# Patient Record
Sex: Female | Born: 1975 | Race: Black or African American | Hispanic: No | Marital: Married | Smoking: Never smoker
Health system: Southern US, Community
[De-identification: ages and names within clinical notes are randomized; demographics above are authoritative.]

## PROBLEM LIST (undated history)

## (undated) ENCOUNTER — Inpatient Hospital Stay (HOSPITAL_COMMUNITY): Payer: Self-pay

## (undated) DIAGNOSIS — H209 Unspecified iridocyclitis: Secondary | ICD-10-CM

## (undated) DIAGNOSIS — K219 Gastro-esophageal reflux disease without esophagitis: Secondary | ICD-10-CM

## (undated) DIAGNOSIS — F329 Major depressive disorder, single episode, unspecified: Secondary | ICD-10-CM

## (undated) DIAGNOSIS — O24419 Gestational diabetes mellitus in pregnancy, unspecified control: Secondary | ICD-10-CM

## (undated) DIAGNOSIS — E282 Polycystic ovarian syndrome: Secondary | ICD-10-CM

## (undated) DIAGNOSIS — F419 Anxiety disorder, unspecified: Secondary | ICD-10-CM

## (undated) DIAGNOSIS — F32A Depression, unspecified: Secondary | ICD-10-CM

## (undated) DIAGNOSIS — J40 Bronchitis, not specified as acute or chronic: Secondary | ICD-10-CM

## (undated) DIAGNOSIS — M069 Rheumatoid arthritis, unspecified: Secondary | ICD-10-CM

## (undated) DIAGNOSIS — D649 Anemia, unspecified: Secondary | ICD-10-CM

## (undated) HISTORY — DX: Anxiety disorder, unspecified: F41.9

## (undated) HISTORY — DX: Depression, unspecified: F32.A

## (undated) HISTORY — PX: REDUCTION MAMMAPLASTY: SUR839

## (undated) HISTORY — DX: Major depressive disorder, single episode, unspecified: F32.9

## (undated) HISTORY — PX: BREAST REDUCTION SURGERY: SHX8

## (undated) HISTORY — PX: DILATION AND CURETTAGE OF UTERUS: SHX78

## (undated) HISTORY — PX: OTHER SURGICAL HISTORY: SHX169

---

## 2017-03-13 ENCOUNTER — Encounter (HOSPITAL_COMMUNITY): Payer: Self-pay | Admitting: Emergency Medicine

## 2017-03-13 ENCOUNTER — Emergency Department (HOSPITAL_COMMUNITY)
Admission: EM | Admit: 2017-03-13 | Discharge: 2017-03-13 | Disposition: A | Discharge status: Home / Self Care | Payer: Self-pay | Attending: Emergency Medicine | Admitting: Emergency Medicine

## 2017-03-13 DIAGNOSIS — Z5321 Procedure and treatment not carried out due to patient leaving prior to being seen by health care provider: Secondary | ICD-10-CM | POA: Insufficient documentation

## 2017-03-13 DIAGNOSIS — H5712 Ocular pain, left eye: Secondary | ICD-10-CM | POA: Insufficient documentation

## 2017-03-13 HISTORY — DX: Gastro-esophageal reflux disease without esophagitis: K21.9

## 2017-03-13 HISTORY — DX: Rheumatoid arthritis, unspecified: M06.9

## 2017-03-13 HISTORY — DX: Unspecified iridocyclitis: H20.9

## 2017-03-13 NOTE — ED Triage Notes (Signed)
Pt sts left sided eye pain from iritis; pt sts ran out of one of her eye drops; pt is taking pred

## 2017-03-13 NOTE — ED Notes (Signed)
Pt very upset about the wait time. This tech explained we are doing everything possible to get everyone seen in a timely matter. Encouraged Pt to stay.

## 2017-03-14 ENCOUNTER — Encounter (HOSPITAL_COMMUNITY): Payer: Self-pay | Admitting: Emergency Medicine

## 2017-03-14 ENCOUNTER — Emergency Department (HOSPITAL_COMMUNITY)
Admission: EM | Admit: 2017-03-14 | Discharge: 2017-03-14 | Disposition: A | Discharge status: Home / Self Care | Payer: Self-pay | Attending: Emergency Medicine | Admitting: Emergency Medicine

## 2017-03-14 DIAGNOSIS — H5712 Ocular pain, left eye: Secondary | ICD-10-CM | POA: Insufficient documentation

## 2017-03-14 NOTE — ED Notes (Signed)
Bed: WTR5 Expected date:  Expected time:  Means of arrival:  Comments: 

## 2017-03-14 NOTE — Discharge Instructions (Signed)
The eye doctors at [facility] will see you in their office now.

## 2017-03-14 NOTE — ED Notes (Signed)
Bed: WTR6 Expected date:  Expected time:  Means of arrival:  Comments: 

## 2017-03-14 NOTE — ED Triage Notes (Signed)
Pt states that she has been dx with iritis.  Has been bothering her for 3 weeks.  Has been to her opthalmologist who gave her durasol and was seen in ER 3 wks ago and was given prednisone.  Has noticed some improvement with the durasol.  States it's not better.  Hx of rheumatoid arthritis.

## 2017-03-14 NOTE — ED Provider Notes (Signed)
[place] DEPT Provider Note   CSN: [id number] Arrival date & time: 03/14/17  1049   By signing my name below, I, Soijett Blue, attest that this documentation has been prepared under the direction and in the presence of Debroah Baller, NP Electronically Signed: Soijett Blue, ED Scribe. 03/14/17. 11:25 AM.  History   Chief Complaint Chief Complaint  Patient presents with  . Eye Pain    HPI Anna Lucero is a 41 y.o. female with a PMHx of iritis, who presents to the Emergency Department complaining of left eye pain onset 3 weeks ago. Pt reports associated photophobia and blurred vision. Pt has tried Rx durasol and prednisone with mild relief of her symptoms. She notes that her symptoms began with left eye pain and light sensitivity. Pt reports that she was evaluated in the ED in [place] 3 weeks ago and given Rx prednisone. Pt notes that she went to the opthalamologist who prescribed her durasol. Pt states that she was placed on Methapred by her rheumatologist. She notes that she is not returning to [place] and will be staying in [place]. She denies eye discharge, watery eyes, eye redness, fever, chills, and any other symptoms. Denies allergies to any medications.   The history is provided by the patient. No language interpreter was used.  Eye Pain  This is a recurrent problem. The current episode started more than 1 week ago (3 weeks ago). The problem occurs constantly. The problem has not changed since onset.Exacerbated by: lights. Nothing relieves the symptoms. Treatments tried: methapred, prednisone, and durasol. The treatment provided mild relief.    Past Medical History:  Diagnosis Date  . GERD (gastroesophageal reflux disease)   . Iritis   . RA (rheumatoid arthritis) (HCC)     There are no active problems to display for this patient.   No past surgical history on file.  OB History    No data available       Home Medications    Prior to Admission medications   Not on  File    Family History No family history on file.  Social History Social History  Substance Use Topics  . Smoking status: Never Smoker  . Smokeless tobacco: Never Used  . Alcohol use No     Allergies   Celebrex [celecoxib]   Review of Systems Review of Systems  Constitutional: Negative for chills and fever.  HENT: Negative.   Eyes: Positive for photophobia, pain (left) and visual disturbance. Negative for discharge and redness.  Gastrointestinal: Negative for nausea and vomiting.  Musculoskeletal: Positive for arthralgias. Negative for neck stiffness.     Physical Exam Updated Vital Signs BP 121/83 (BP Location: Left Arm)   Pulse 80   Temp 99.1 F (37.3 C) (Oral)   Resp 16   SpO2 100%   Physical Exam  Constitutional: She is oriented to person, place, and time. She appears well-developed and well-nourished. No distress.  HENT:  Head: Normocephalic and atraumatic.  Eyes: EOM are normal.  Left eye with light sensitivity. Difficulty with exam due to patient not being able to open her eye. Conjunctiva is inflamed.   Neck: Neck supple.  Cardiovascular: Normal rate.   Pulmonary/Chest: Effort normal.  Musculoskeletal: Normal range of motion.  Neurological: She is alert and oriented to person, place, and time.  Skin: Skin is warm and dry.  Psychiatric: She has a normal mood and affect. Her behavior is normal.  Nursing note and vitals reviewed.    ED Treatments /  Results  DIAGNOSTIC STUDIES: Oxygen Saturation is 100% on RA, nl by my interpretation.    COORDINATION OF CARE: 11:22 AM Discussed treatment plan with pt at bedside which includes referral to ophthalmologist and pt agreed to plan.  11:26 AM- Call to [facility] who will see the patient in the office now.    Labs (all labs ordered are listed, but only abnormal results are displayed) Labs Reviewed - No data to display  Radiology No results found.  Procedures Procedures (including  critical care time)  Medications Ordered in ED Medications - No data to display   Initial Impression / Assessment and Plan / ED Course  I have reviewed the triage vital signs and the nursing notes.  Pt presents with left eye pain x 3 weeks. Pt has been evaluated with a full visual workup while in [place] in the ED and with her ophthalmologist. She is out of her eye drops and will be living in [place] and needs a referral.  Pt has been informed to follow up with [facility] today for further evaluation. The patient appears reasonably screened and/or stabilized for discharge and I doubt any other emergent medical condition requiring further screening, evaluation, or treatment in the ED prior to discharge.  Final Clinical Impressions(s) / ED Diagnoses   Final diagnoses:  Left eye pain    New Prescriptions There are no discharge medications for this patient.  I personally performed the services described in this documentation, which was scribed in my presence. The recorded information has been reviewed and is accurate.    Debroah Baller [place], [place] 03/14/17 1233    Davonna Belling, MD 03/14/17 [phone]

## 2017-04-02 ENCOUNTER — Emergency Department (HOSPITAL_COMMUNITY)
Admission: EM | Admit: 2017-04-02 | Discharge: 2017-04-02 | Disposition: A | Discharge status: Home / Self Care | Payer: Medicaid Other | Attending: Emergency Medicine | Admitting: Emergency Medicine

## 2017-04-02 ENCOUNTER — Encounter (HOSPITAL_COMMUNITY): Payer: Self-pay | Admitting: *Deleted

## 2017-04-02 DIAGNOSIS — H5711 Ocular pain, right eye: Secondary | ICD-10-CM | POA: Diagnosis not present

## 2017-04-02 MED ORDER — PREDNISONE 20 MG PO TABS
10.0000 mg | ORAL_TABLET | Freq: Once | ORAL | Status: AC
  Administered 2017-04-02: 10 mg via ORAL
  Filled 2017-04-02: qty 1

## 2017-04-02 MED ORDER — PREDNISONE 20 MG PO TABS: 10.0000 mg | ORAL_TABLET | Freq: Every day | ORAL | 0 refills | Status: DC

## 2017-04-02 NOTE — Discharge Instructions (Signed)
Increase Durazol eyedrops to twice daily. Dr Juluis Pitch from [facility] will see you in the office Tuesday, 04/04/2017 at 8 AM. If you need to switch the time, call the office the morning of 04/04/2017 to arrange a different time that day. Call the number on these discharge instructions to get a primary care physician to [place] for rheumatoid arthritis in any other health problems

## 2017-04-02 NOTE — ED Notes (Signed)
Patient is alert and oriented x3.  She was given DC instructions and follow up visit instructions.  Patient gave verbal understanding. She was DC ambulatory under her own power to home.  V/S stable.  He was not showing any signs of distress on DC 

## 2017-04-02 NOTE — ED Triage Notes (Signed)
Patient is alert and oriented x4.  She is being seen for exacerbation of iritis.  Patient has a Hx of RA and was Dx with iritis 2 months ago and states that this has been an ongoing issue.  Patient has been taking steroids and has tapered off of them to have the iritis flare up.  Currently she rates her pain 8 of 10.

## 2017-04-02 NOTE — ED Notes (Signed)
Bed: WTR7 Expected date:  Expected time:  Means of arrival:  Comments: 

## 2017-04-02 NOTE — ED Provider Notes (Signed)
[facility] DEPT Provider Note   CSN: [id number] Arrival date & time: 04/02/17  0602     History   Chief Complaint Chief Complaint  Patient presents with  . Eye Pain    HPI Anna Lucero is a 41 y.o. female.  HPI Complains of leftt eye pain and progressively worsening vision in right eye for the past one month. Patient was diagnosed with iritis in her right eye month ago from a physician in [place]. She was seen here on 03/14/2017 for the same complaint. That same day she saw Dr.Christopher Manuella Ghazi.. She has been on orencia injections as well as Durazol eyedrops and prednisone.Durazol drops were tapered down to once daily over recent days. She ran out of prednisone 2 days ago after being on prednisone for several years.. Nothing makes symptoms better or worse. No other associated no nausea or vomiting Past Medical History:  Diagnosis Date  . GERD (gastroesophageal reflux disease)   . Iritis   . RA (rheumatoid arthritis) (HCC)     There are no active problems to display for this patient.   Past Surgical History:  Procedure Laterality Date  . abdominal plasty    . filopian tube removal      OB History    No data available       Home Medications    Prior to Admission medications   Not on File    Family History History reviewed. No pertinent family history.  Social History Social History  Substance Use Topics  . Smoking status: Never Smoker  . Smokeless tobacco: Never Used  . Alcohol use No     Allergies   Celebrex [celecoxib]   Review of Systems Review of Systems  Constitutional: Negative.   Eyes: Positive for pain and visual disturbance. Negative for redness.     Physical Exam Updated Vital Signs BP 124/86 (BP Location: Left Arm)   Pulse 82   Temp 98.6 F (37 C) (Oral)   Resp 18   Ht 5\' 2"  (1.575 m)   Wt 83 kg (183 lb)   LMP 03/04/2017   SpO2 100%   BMI 33.47 kg/m   Physical Exam  Constitutional: She is oriented to person, place,  and time. She appears well-developed and well-nourished.  HENT:  Head: Normocephalic and atraumatic.  Eyes: Pupils are equal, round, and reactive to light. Conjunctivae are normal. Left eye exhibits no discharge.  Coronaries are normal. Visual acuity right eye 20/25-1 Left eye 20/70. There is no redness  Neck: Neck supple. No tracheal deviation present. No thyromegaly present.  Cardiovascular: Normal rate.   No murmur heard. Pulmonary/Chest: Effort normal.  Abdominal: She exhibits no distension.  Musculoskeletal: Normal range of motion. She exhibits no edema.  Neurological: She is alert and oriented to person, place, and time. Coordination normal.  Gait normal cranial nerves II through XII grossly intact  Skin: Skin is warm and dry. No rash noted.  Psychiatric: She has a normal mood and affect.  Nursing note and vitals reviewed.    ED Treatments / Results  Labs (all labs ordered are listed, but only abnormal results are displayed) Labs Reviewed - No data to display  EKG  EKG Interpretation None       Radiology No results found.  Procedures Procedures (including critical care time)  Medications Ordered in ED Medications - No data to display  I spoke with Dr.Botorrff on-call for [place] at [facility]. Plan increase durazol lobster twice daily. Already prescription for prednisone 10  mg daily. She is referred to primary care. Dr.Botorff will see patient in her office in 2 days Initial Impression / Assessment and Plan / ED Course  I have reviewed the triage vital signs and the nursing notes.  Pertinent labs & imaging results that were available during my care of the patient were reviewed by me and considered in my medical decision making (see chart for details).       Final Clinical Impressions(s) / ED Diagnoses  Dx right eye pain Final diagnoses:  None    New Prescriptions New Prescriptions   No medications on file     Orlie Dakin,  MD 04/02/17 [phone]

## 2017-04-02 NOTE — ED Notes (Signed)
Visual acuity completed by MD.

## 2017-05-23 ENCOUNTER — Encounter: Payer: Self-pay | Admitting: Emergency Medicine

## 2017-05-23 ENCOUNTER — Emergency Department (HOSPITAL_COMMUNITY): Payer: Medicaid Other

## 2017-05-23 ENCOUNTER — Emergency Department (HOSPITAL_COMMUNITY)
Admission: EM | Admit: 2017-05-23 | Discharge: 2017-05-23 | Disposition: A | Discharge status: Home / Self Care | Payer: Medicaid Other | Attending: Emergency Medicine | Admitting: Emergency Medicine

## 2017-05-23 DIAGNOSIS — N83202 Unspecified ovarian cyst, left side: Secondary | ICD-10-CM | POA: Diagnosis not present

## 2017-05-23 DIAGNOSIS — R1030 Lower abdominal pain, unspecified: Secondary | ICD-10-CM | POA: Diagnosis present

## 2017-05-23 DIAGNOSIS — Z79899 Other long term (current) drug therapy: Secondary | ICD-10-CM | POA: Diagnosis not present

## 2017-05-23 HISTORY — DX: Polycystic ovarian syndrome: E28.2

## 2017-05-23 LAB — LIPASE, BLOOD: LIPASE: 37 U/L (ref 11–51)

## 2017-05-23 LAB — CBC
HEMATOCRIT: 34.6 % — AB (ref 36.0–46.0)
HEMOGLOBIN: 10.7 g/dL — AB (ref 12.0–15.0)
MCH: 23.2 pg — ABNORMAL LOW (ref 26.0–34.0)
MCHC: 30.9 g/dL (ref 30.0–36.0)
MCV: 74.9 fL — ABNORMAL LOW (ref 78.0–100.0)
Platelets: 324 10*3/uL (ref 150–400)
RBC: 4.62 MIL/uL (ref 3.87–5.11)
RDW: 17.9 % — ABNORMAL HIGH (ref 11.5–15.5)
WBC: 13 10*3/uL — ABNORMAL HIGH (ref 4.0–10.5)

## 2017-05-23 LAB — URINALYSIS, ROUTINE W REFLEX MICROSCOPIC
BILIRUBIN URINE: NEGATIVE
GLUCOSE, UA: 50 mg/dL — AB
HGB URINE DIPSTICK: NEGATIVE
Ketones, ur: NEGATIVE mg/dL
Leukocytes, UA: NEGATIVE
NITRITE: NEGATIVE
PROTEIN: NEGATIVE mg/dL
Specific Gravity, Urine: 1.032 — ABNORMAL HIGH (ref 1.005–1.030)
pH: 6 (ref 5.0–8.0)

## 2017-05-23 LAB — COMPREHENSIVE METABOLIC PANEL
ALBUMIN: 3.7 g/dL (ref 3.5–5.0)
ALT: 16 U/L (ref 14–54)
ANION GAP: 10 (ref 5–15)
AST: 19 U/L (ref 15–41)
Alkaline Phosphatase: 70 U/L (ref 38–126)
BILIRUBIN TOTAL: 0.2 mg/dL — AB (ref 0.3–1.2)
BUN: 21 mg/dL — ABNORMAL HIGH (ref 6–20)
CO2: 22 mmol/L (ref 22–32)
Calcium: 9 mg/dL (ref 8.9–10.3)
Chloride: 107 mmol/L (ref 101–111)
Creatinine, Ser: 0.8 mg/dL (ref 0.44–1.00)
GFR calc Af Amer: >60 mL/min (ref >60)
GFR calc non Af Amer: >60 mL/min (ref >60)
GLUCOSE: 147 mg/dL — AB (ref 65–99)
POTASSIUM: 3.6 mmol/L (ref 3.5–5.1)
SODIUM: 139 mmol/L (ref 135–145)
TOTAL PROTEIN: 7.5 g/dL (ref 6.5–8.1)

## 2017-05-23 LAB — I-STAT BETA HCG BLOOD, ED (MC, WL, AP ONLY)

## 2017-05-23 LAB — WET PREP, GENITAL
CLUE CELLS WET PREP: NONE SEEN
SPERM: NONE SEEN
Trich, Wet Prep: NONE SEEN
Yeast Wet Prep HPF POC: NONE SEEN

## 2017-05-23 MED ORDER — KETOROLAC TROMETHAMINE 15 MG/ML IJ SOLN
15.0000 mg | Freq: Once | INTRAMUSCULAR | Status: AC
  Administered 2017-05-23: 15 mg via INTRAVENOUS
  Filled 2017-05-23: qty 1

## 2017-05-23 MED ORDER — TRAMADOL HCL 50 MG PO TABS: 50.0000 mg | ORAL_TABLET | Freq: Four times a day (QID) | ORAL | 0 refills | Status: DC | PRN

## 2017-05-23 MED ORDER — NAPROXEN 500 MG PO TABS: 500.0000 mg | ORAL_TABLET | Freq: Two times a day (BID) | ORAL | 0 refills | Status: DC

## 2017-05-23 NOTE — Discharge Instructions (Signed)
Naprosyn for pain and inflammation. Tramadol for severe pain. Follow up with GYN doctor as scheduled. Return if worsening.

## 2017-05-23 NOTE — ED Provider Notes (Signed)
[facility] DEPT Provider Note   CSN: [id number] Arrival date & time: 05/23/17  1641     History   Chief Complaint Chief Complaint  Patient presents with  . Abdominal Pain    HPI Anna Lucero is a 41 y.o. female.  HPI Anna Lucero is a 41 y.o. female presents to ED with complaint of abdominal pain. Pain is sharp, in the lower abdomen. States feels like "air stuck in cervix." also reports similar to prior ovarian cyst. Worse with walking. No vaginal dc or bleeding. No urinary symptoms. No n/v/d. No fever or chills. Pt tried aleve for help with no relief. Denies any other associated symptoms. She does report history of polycystic ovarian syndrome.  Past Medical History:  Diagnosis Date  . GERD (gastroesophageal reflux disease)   . Iritis   . PCOS (polycystic ovarian syndrome)   . RA (rheumatoid arthritis) (HCC)     There are no active problems to display for this patient.   Past Surgical History:  Procedure Laterality Date  . abdominal plasty    . filopian tube removal      OB History    No data available       Home Medications    Prior to Admission medications   Medication Sig Start Date End Date Taking? Authorizing Provider  MAGNESIUM PO Take 1 tablet by mouth daily.   Yes [provider]  NIACIN PO Take 1 tablet by mouth daily.   Yes [provider]  predniSONE (DELTASONE) 20 MG tablet Take 0.5 tablets (10 mg total) by mouth daily with breakfast. Patient taking differently: Take 20 mg by mouth daily with breakfast.  04/02/17  Yes Orlie Dakin, MD    Family History No family history on file.  Social History Social History  Substance Use Topics  . Smoking status: Never Smoker  . Smokeless tobacco: Never Used  . Alcohol use No     Allergies   Celebrex [celecoxib]   Review of Systems Review of Systems  Constitutional: Negative for chills and fever.  Respiratory: Negative for cough, chest  tightness and shortness of breath.   Cardiovascular: Negative for chest pain, palpitations and leg swelling.  Gastrointestinal: Positive for abdominal pain. Negative for anal bleeding, diarrhea, nausea and vomiting.  Genitourinary: Positive for pelvic pain. Negative for dysuria, flank pain, vaginal bleeding, vaginal discharge and vaginal pain.  Musculoskeletal: Negative for arthralgias, myalgias, neck pain and neck stiffness.  Skin: Negative for rash.  Neurological: Negative for dizziness, weakness and headaches.  All other systems reviewed and are negative.    Physical Exam Updated Vital Signs BP (!) 151/96 (BP Location: Left Arm)   Pulse 83   Temp 98.3 F (36.8 C) (Oral)   Resp 14   Ht 5\' 3"  (1.6 m)   Wt 85.3 kg (188 lb)   LMP 04/30/2017   SpO2 100%   BMI 33.30 kg/m   Physical Exam  Constitutional: She appears well-developed and well-nourished. No distress.  HENT:  Head: Normocephalic.  Eyes: Conjunctivae are normal.  Neck: Neck supple.  Cardiovascular: Normal rate, regular rhythm and normal heart sounds.   Pulmonary/Chest: Effort normal and breath sounds normal. No respiratory distress. She has no wheezes. She has no rales.  Abdominal: Soft. Bowel sounds are normal. She exhibits no distension. There is tenderness. There is no rebound.  Suprapubic tenderness  Genitourinary:  Genitourinary Comments: Normal external genitalia. Normal vaginal canal. Small thin white discharge. Cervix is normal, closed. No CMT. Positive  uterine and bilateral adnexal tenderness. No masses palpated.    Musculoskeletal: She exhibits no edema.  Neurological: She is alert.  Skin: Skin is warm and dry.  Psychiatric: She has a normal mood and affect. Her behavior is normal.  Nursing note and vitals reviewed.    ED Treatments / Results  Labs (all labs ordered are listed, but only abnormal results are displayed) Labs Reviewed  COMPREHENSIVE METABOLIC PANEL - Abnormal; Notable for the following:        Result Value   Glucose, Bld 147 (*)    BUN 21 (*)    Total Bilirubin 0.2 (*)    All other components within normal limits  CBC - Abnormal; Notable for the following:    WBC 13.0 (*)    Hemoglobin 10.7 (*)    HCT 34.6 (*)    MCV 74.9 (*)    MCH 23.2 (*)    RDW 17.9 (*)    All other components within normal limits  LIPASE, BLOOD  URINALYSIS, ROUTINE W REFLEX MICROSCOPIC  I-STAT BETA HCG BLOOD, ED ([facility], [facility], [facility] ONLY)    EKG  EKG Interpretation None       Radiology US Transvaginal Non-ob  Result Date: 05/23/2017 CLINICAL DATA:  Initial evaluation for acute lower abdominal pain. History of polycystic ovaries. EXAM: TRANSABDOMINAL AND TRANSVAGINAL ULTRASOUND OF PELVIS DOPPLER ULTRASOUND OF OVARIES TECHNIQUE: Both transabdominal and transvaginal ultrasound examinations of the pelvis were performed. Transabdominal technique was performed for global imaging of the pelvis including uterus, ovaries, adnexal regions, and pelvic cul-de-sac. It was necessary to proceed with endovaginal exam following the transabdominal exam to visualize the uterus and ovaries. Color and duplex Doppler ultrasound was utilized to evaluate blood flow to the ovaries. COMPARISON:  None. FINDINGS: Uterus Measurements: 8.3 x 4.6 x 5.7 cm. No fibroids or other mass visualized. Endometrium Thickness: 9.9 mm.  No focal abnormality visualized. Right ovary Measurements: 4.3 x 2.7 x 3.9 cm. Normal appearance/no adnexal mass. Left ovary Measurements: 5.1 x 4.3 x 5.2 cm. 3.7 x 3.8 x 3.5 cm complex left ovarian cyst demonstrating heterogeneous internal echogenicity. No internal vascularity or other concerning features. Pulsed Doppler evaluation of both ovaries demonstrates normal low-resistance arterial and venous waveforms. Other findings Trace free fluid within the pelvis, likely physiologic. IMPRESSION: 1. 3.7 x 3.8 x 3.5 cm complex left ovarian cyst, suspected to reflect a hemorrhagic cyst or possibly complex physiologic  cyst with internal debris. A short interval follow-up ultrasound in 6-12 weeks to ensure resolution is recommended. 2. Otherwise normal pelvic ultrasound. No evidence for ovarian torsion. Electronically Signed   By: Jeannine Boga M.D.   On: 05/23/2017 21:41   US Pelvis Complete  Result Date: 05/23/2017 CLINICAL DATA:  Initial evaluation for acute lower abdominal pain. History of polycystic ovaries. EXAM: TRANSABDOMINAL AND TRANSVAGINAL ULTRASOUND OF PELVIS DOPPLER ULTRASOUND OF OVARIES TECHNIQUE: Both transabdominal and transvaginal ultrasound examinations of the pelvis were performed. Transabdominal technique was performed for global imaging of the pelvis including uterus, ovaries, adnexal regions, and pelvic cul-de-sac. It was necessary to proceed with endovaginal exam following the transabdominal exam to visualize the uterus and ovaries. Color and duplex Doppler ultrasound was utilized to evaluate blood flow to the ovaries. COMPARISON:  None. FINDINGS: Uterus Measurements: 8.3 x 4.6 x 5.7 cm. No fibroids or other mass visualized. Endometrium Thickness: 9.9 mm.  No focal abnormality visualized. Right ovary Measurements: 4.3 x 2.7 x 3.9 cm. Normal appearance/no adnexal mass. Left ovary Measurements: 5.1 x 4.3 x 5.2 cm. 3.7  x 3.8 x 3.5 cm complex left ovarian cyst demonstrating heterogeneous internal echogenicity. No internal vascularity or other concerning features. Pulsed Doppler evaluation of both ovaries demonstrates normal low-resistance arterial and venous waveforms. Other findings Trace free fluid within the pelvis, likely physiologic. IMPRESSION: 1. 3.7 x 3.8 x 3.5 cm complex left ovarian cyst, suspected to reflect a hemorrhagic cyst or possibly complex physiologic cyst with internal debris. A short interval follow-up ultrasound in 6-12 weeks to ensure resolution is recommended. 2. Otherwise normal pelvic ultrasound. No evidence for ovarian torsion. Electronically Signed   By: Jeannine Boga M.D.   On: 05/23/2017 21:41   [place] Art/ven Flow Abd Pelv Doppler  Result Date: 05/23/2017 CLINICAL DATA:  Initial evaluation for acute lower abdominal pain. History of polycystic ovaries. EXAM: TRANSABDOMINAL AND TRANSVAGINAL ULTRASOUND OF PELVIS DOPPLER ULTRASOUND OF OVARIES TECHNIQUE: Both transabdominal and transvaginal ultrasound examinations of the pelvis were performed. Transabdominal technique was performed for global imaging of the pelvis including uterus, ovaries, adnexal regions, and pelvic cul-de-sac. It was necessary to proceed with endovaginal exam following the transabdominal exam to visualize the uterus and ovaries. Color and duplex Doppler ultrasound was utilized to evaluate blood flow to the ovaries. COMPARISON:  None. FINDINGS: Uterus Measurements: 8.3 x 4.6 x 5.7 cm. No fibroids or other mass visualized. Endometrium Thickness: 9.9 mm.  No focal abnormality visualized. Right ovary Measurements: 4.3 x 2.7 x 3.9 cm. Normal appearance/no adnexal mass. Left ovary Measurements: 5.1 x 4.3 x 5.2 cm. 3.7 x 3.8 x 3.5 cm complex left ovarian cyst demonstrating heterogeneous internal echogenicity. No internal vascularity or other concerning features. Pulsed Doppler evaluation of both ovaries demonstrates normal low-resistance arterial and venous waveforms. Other findings Trace free fluid within the pelvis, likely physiologic. IMPRESSION: 1. 3.7 x 3.8 x 3.5 cm complex left ovarian cyst, suspected to reflect a hemorrhagic cyst or possibly complex physiologic cyst with internal debris. A short interval follow-up ultrasound in 6-12 weeks to ensure resolution is recommended. 2. Otherwise normal pelvic ultrasound. No evidence for ovarian torsion. Electronically Signed   By: Jeannine Boga M.D.   On: 05/23/2017 21:41    Procedures Procedures (including critical care time)  Medications Ordered in ED Medications - No data to display   Initial Impression / Assessment and Plan / ED Course    I have reviewed the triage vital signs and the nursing notes.  Pertinent labs & imaging results that were available during my care of the patient were reviewed by me and considered in my medical decision making (see chart for details).     Patient in emergency department with pelvic pain. She states history of ovarian cysts and pain similar to that. Pain is worsened with movement. Pain is more in the left side. Denies any changes in her bowels. No nausea or vomiting. No anorexia. No GI symptoms, doubt colitis or appendicitis. Will perform pelvic exam, labs pending.   10:33 PM White blood cell count slightly elevated at 13, hemoglobin 10.7, otherwise unremarkable labs. Ultrasound showed complex, 3.7 x 3.8 x 3.5 cm complex cyst on left ovary. No evidence of torsion. No other abnormalities. Patient feels much better after receiving Toradol. Her vital signs are normal. Showed he has an appointment with her GYN in 2 days. Plenty discharge home with NSAIDs and tramadol, follow-up with her doctor as scheduled. Return precautions discussed if worsening symptoms.  Vitals:   05/23/17 1713 05/23/17 2004 05/23/17 2007 05/23/17 2225  BP: 140/90 (!) 151/96  110/76  Pulse: 97 83  74  Resp: 20 14  14   Temp: 98.3 F (36.8 C)     TempSrc: Oral     SpO2: 97% 100% 100% 100%  Weight: 85.3 kg (188 lb)     Height: 5\' 3"  (1.6 m)        Final Clinical Impressions(s) / ED Diagnoses   Final diagnoses:  Cyst of left ovary    New Prescriptions New Prescriptions   NAPROXEN (NAPROSYN) 500 MG TABLET    Take 1 tablet (500 mg total) by mouth 2 (two) times daily.   TRAMADOL (ULTRAM) 50 MG TABLET    Take 1 tablet (50 mg total) by mouth every 6 (six) hours as needed.     Jeannett Senior, PA-C 05/23/17 2238    Gareth Morgan, MD 05/24/17 [phone]

## 2017-05-23 NOTE — ED Triage Notes (Signed)
Patient c/o lower abd pain that has been constant since starting this morning. Patient denies any n/v/d or urinary problems.

## 2017-05-24 LAB — GC/CHLAMYDIA PROBE AMP (~~LOC~~) NOT AT ARMC
CHLAMYDIA, DNA PROBE: NEGATIVE
Neisseria Gonorrhea: NEGATIVE

## 2017-05-25 DIAGNOSIS — D649 Anemia, unspecified: Secondary | ICD-10-CM | POA: Insufficient documentation

## 2017-05-25 DIAGNOSIS — K2100 Gastro-esophageal reflux disease with esophagitis, without bleeding: Secondary | ICD-10-CM | POA: Insufficient documentation

## 2017-05-25 DIAGNOSIS — E282 Polycystic ovarian syndrome: Secondary | ICD-10-CM | POA: Insufficient documentation

## 2017-05-25 DIAGNOSIS — M059 Rheumatoid arthritis with rheumatoid factor, unspecified: Secondary | ICD-10-CM | POA: Insufficient documentation

## 2017-08-01 ENCOUNTER — Emergency Department (HOSPITAL_COMMUNITY): Payer: BLUE CROSS/BLUE SHIELD

## 2017-08-01 ENCOUNTER — Emergency Department (HOSPITAL_COMMUNITY)
Admission: EM | Admit: 2017-08-01 | Discharge: 2017-08-01 | Disposition: A | Discharge status: Home / Self Care | Payer: BLUE CROSS/BLUE SHIELD | Attending: Emergency Medicine | Admitting: Emergency Medicine

## 2017-08-01 ENCOUNTER — Encounter (HOSPITAL_COMMUNITY): Payer: Self-pay | Admitting: Emergency Medicine

## 2017-08-01 DIAGNOSIS — R102 Pelvic and perineal pain: Secondary | ICD-10-CM

## 2017-08-01 DIAGNOSIS — R439 Unspecified disturbances of smell and taste: Secondary | ICD-10-CM | POA: Insufficient documentation

## 2017-08-01 DIAGNOSIS — Z3A01 Less than 8 weeks gestation of pregnancy: Secondary | ICD-10-CM

## 2017-08-01 DIAGNOSIS — O26891 Other specified pregnancy related conditions, first trimester: Secondary | ICD-10-CM | POA: Insufficient documentation

## 2017-08-01 DIAGNOSIS — Z3A00 Weeks of gestation of pregnancy not specified: Secondary | ICD-10-CM | POA: Insufficient documentation

## 2017-08-01 LAB — COMPREHENSIVE METABOLIC PANEL
ALBUMIN: 3.4 g/dL — AB (ref 3.5–5.0)
ALT: 16 U/L (ref 14–54)
ANION GAP: 9 (ref 5–15)
AST: 15 U/L (ref 15–41)
Alkaline Phosphatase: 55 U/L (ref 38–126)
BILIRUBIN TOTAL: 0.3 mg/dL (ref 0.3–1.2)
BUN: 18 mg/dL (ref 6–20)
CHLORIDE: 105 mmol/L (ref 101–111)
CO2: 23 mmol/L (ref 22–32)
Calcium: 9 mg/dL (ref 8.9–10.3)
Creatinine, Ser: 0.54 mg/dL (ref 0.44–1.00)
GFR calc Af Amer: >60 mL/min (ref >60)
GLUCOSE: 117 mg/dL — AB (ref 65–99)
POTASSIUM: 3 mmol/L — AB (ref 3.5–5.1)
Sodium: 137 mmol/L (ref 135–145)
TOTAL PROTEIN: 6.9 g/dL (ref 6.5–8.1)

## 2017-08-01 LAB — URINALYSIS, ROUTINE W REFLEX MICROSCOPIC
BILIRUBIN URINE: NEGATIVE
Glucose, UA: 150 mg/dL — AB
HGB URINE DIPSTICK: NEGATIVE
Ketones, ur: 5 mg/dL — AB
Nitrite: NEGATIVE
PROTEIN: NEGATIVE mg/dL
SPECIFIC GRAVITY, URINE: 1.035 — AB (ref 1.005–1.030)
pH: 5 (ref 5.0–8.0)

## 2017-08-01 LAB — CBC WITH DIFFERENTIAL/PLATELET
BASOS ABS: 0 10*3/uL (ref 0.0–0.1)
Basophils Relative: 0 %
Eosinophils Absolute: 0.1 10*3/uL (ref 0.0–0.7)
Eosinophils Relative: 1 %
HEMATOCRIT: 32.3 % — AB (ref 36.0–46.0)
HEMOGLOBIN: 10.1 g/dL — AB (ref 12.0–15.0)
LYMPHS PCT: 20 %
Lymphs Abs: 4.4 10*3/uL — ABNORMAL HIGH (ref 0.7–4.0)
MCH: 23.2 pg — ABNORMAL LOW (ref 26.0–34.0)
MCHC: 31.3 g/dL (ref 30.0–36.0)
MCV: 74.3 fL — AB (ref 78.0–100.0)
Monocytes Absolute: 1.4 10*3/uL — ABNORMAL HIGH (ref 0.1–1.0)
Monocytes Relative: 6 %
NEUTROS ABS: 16.5 10*3/uL — AB (ref 1.7–7.7)
Neutrophils Relative %: 73 %
Platelets: 310 10*3/uL (ref 150–400)
RBC: 4.35 MIL/uL (ref 3.87–5.11)
RDW: 17.2 % — ABNORMAL HIGH (ref 11.5–15.5)
WBC: 22.5 10*3/uL — AB (ref 4.0–10.5)

## 2017-08-01 LAB — PREGNANCY, URINE: PREG TEST UR: POSITIVE — AB

## 2017-08-01 LAB — POC URINE PREG, ED: PREG TEST UR: POSITIVE — AB

## 2017-08-01 LAB — HCG, QUANTITATIVE, PREGNANCY: HCG, BETA CHAIN, QUANT, S: 81701 m[IU]/mL — AB (ref <5)

## 2017-08-01 NOTE — ED Provider Notes (Signed)
[facility] DEPT Provider Note   CSN: [id number] Arrival date & time: 08/01/17  0816     History   Chief Complaint Chief Complaint  Patient presents with  . Headache  . metalic taste in mouth    HPI Anna HUNEKE is a 42 y.o. female.  She presents for evaluation of "something prolapsing to my vagina."  This occurred 3 days ago, suddenly, with a gush of vaginal bleeding.  She was evaluated in the ED 2 months ago at that time had a complex cyst, was referred to GYN who she saw, he recommended that Mirena contraceptive device, which she declined.  She states she has not had a menstrual cycle in 2 years.  She also reports a "metallic taste in her mouth, present for 1 week associated with nausea, occasional vomiting, and ill feeling.  She denies fever, chills, cough, shortness of breath or chest pain.  There are no other known modifying factors.    HPI  Past Medical History:  Diagnosis Date  . GERD (gastroesophageal reflux disease)   . Iritis   . PCOS (polycystic ovarian syndrome)   . RA (rheumatoid arthritis) (HCC)     There are no active problems to display for this patient.   Past Surgical History:  Procedure Laterality Date  . abdominal plasty    . filopian tube removal      OB History    No data available       Home Medications    Prior to Admission medications   Medication Sig Start Date End Date Taking? Authorizing Provider  EVENING PRIMROSE OIL PO Take 2 capsules by mouth daily.   Yes [provider]  ibuprofen (ADVIL,MOTRIN) 200 MG tablet Take 600 mg by mouth every 6 (six) hours as needed for moderate pain.   Yes [provider]  Menthol, Topical Analgesic, (BIOFREEZE ROLL-ON) 4 % GEL Apply 1 application topically as needed (pain).   Yes [provider]  naproxen (NAPROSYN) 500 MG tablet Take 1 tablet (500 mg total) by mouth 2 (two) times daily. 05/23/17  Yes Kirichenko, Tatyana, PA-C  omeprazole  (PRILOSEC) 20 MG capsule Take 20 mg by mouth 3 (three) times a week.   Yes [provider]  predniSONE (DELTASONE) 20 MG tablet Take 0.5 tablets (10 mg total) by mouth daily with breakfast. Patient taking differently: Take 20 mg by mouth daily with breakfast.  04/02/17  Yes Orlie Dakin, MD  traMADol (ULTRAM) 50 MG tablet Take 1 tablet (50 mg total) by mouth every 6 (six) hours as needed. Patient not taking: Reported on 08/01/2017 05/23/17   Jeannett Senior, PA-C    Family History No family history on file.  Social History Social History   Tobacco Use  . Smoking status: Never Smoker  . Smokeless tobacco: Never Used  Substance Use Topics  . Alcohol use: No  . Drug use: No     Allergies   Celebrex [celecoxib]   Review of Systems Review of Systems  All other systems reviewed and are negative.    Physical Exam Updated Vital Signs BP (!) 136/91   Pulse 87   Temp 98.3 F (36.8 C) (Oral)   Resp (!) 23   SpO2 99%   Physical Exam  Constitutional: She is oriented to person, place, and time. She appears well-developed and well-nourished.  HENT:  Head: Normocephalic and atraumatic.  Eyes: Conjunctivae and EOM are normal. Pupils are equal, round, and reactive to light.  Neck: Normal range  of motion and phonation normal. Neck supple.  Cardiovascular: Normal rate and regular rhythm.  Pulmonary/Chest: Effort normal and breath sounds normal. She exhibits no tenderness.  Abdominal: Soft. She exhibits no distension. There is no tenderness. There is no guarding.  Genitourinary:  Genitourinary Comments: Normal external female genitalia.  No significant vaginal discharge.  Unable to see cervix with speculum exam.  Bimanual examination there is a large mass palpated in the midline to the left pelvic region.  This mass is moderately tender.  It is likely outside of the vagina.  Musculoskeletal: Normal range of motion.  Neurological: She is alert and oriented to person, place,  and time. She exhibits normal muscle tone.  Skin: Skin is warm and dry.  Psychiatric: She has a normal mood and affect. Her behavior is normal. Judgment and thought content normal.  Nursing note and vitals reviewed.    ED Treatments / Results  Labs (all labs ordered are listed, but only abnormal results are displayed) Labs Reviewed  COMPREHENSIVE METABOLIC PANEL - Abnormal; Notable for the following components:      Result Value   Potassium 3.0 (*)    Glucose, Bld 117 (*)    Albumin 3.4 (*)    All other components within normal limits  CBC WITH DIFFERENTIAL/PLATELET - Abnormal; Notable for the following components:   WBC 22.5 (*)    Hemoglobin 10.1 (*)    HCT 32.3 (*)    MCV 74.3 (*)    MCH 23.2 (*)    RDW 17.2 (*)    Neutro Abs 16.5 (*)    Lymphs Abs 4.4 (*)    Monocytes Absolute 1.4 (*)    All other components within normal limits  URINALYSIS, ROUTINE W REFLEX MICROSCOPIC - Abnormal; Notable for the following components:   APPearance CLOUDY (*)    Specific Gravity, Urine 1.035 (*)    Glucose, UA 150 (*)    Ketones, ur 5 (*)    Leukocytes, UA TRACE (*)    Bacteria, UA RARE (*)    Squamous Epithelial / LPF 6-30 (*)    Crystals PRESENT (*)    All other components within normal limits  PREGNANCY, URINE - Abnormal; Notable for the following components:   Preg Test, Ur POSITIVE (*)    All other components within normal limits  POC URINE PREG, ED - Abnormal; Notable for the following components:   Preg Test, Ur POSITIVE (*)    All other components within normal limits  HCG, QUANTITATIVE, PREGNANCY    EKG  EKG Interpretation None       Radiology US Ob Comp Less 14 Wks  Result Date: 08/01/2017 CLINICAL DATA:  Pelvic pain for 10 days. Complex left ovarian cystic lesion on recent ultrasound. Positive pregnancy test. Unknown LMP. EXAM: OBSTETRIC <14 WK [place] AND TRANSVAGINAL OB US DOPPLER ULTRASOUND OF OVARIES TECHNIQUE: Both transabdominal and transvaginal ultrasound  examinations were performed for complete evaluation of the gestation as well as the maternal uterus, adnexal regions, and pelvic cul-de-sac. Transvaginal technique was performed to assess early pregnancy. Color and duplex Doppler ultrasound was utilized to evaluate blood flow to the ovaries. COMPARISON:  None. FINDINGS: Intrauterine gestational sac: Single Yolk sac:  Visualized. Embryo:  Visualized. Cardiac Activity: Not Visualized. CRL:   6  mm   6 w 2 d                  [place] EDC: 03/25/2018 Subchorionic hemorrhage:  None visualized. Maternal uterus/adnexae: Right ovarian corpus luteum cyst noted.  Normal appearance of left ovary. No mass or abnormal free fluid identified. Pulsed Doppler evaluation of both ovaries demonstrates normal appearing low-resistance arterial and venous waveforms. IMPRESSION: Findings are highly suspicious but not yet definitive for failed pregnancy. Recommend follow-up US in 7 days for definitive diagnosis. This recommendation follows SRU consensus guidelines: Diagnostic Criteria for Nonviable Pregnancy Early in the First Trimester. [employer] Med 2013; 893:8101-75. No mass or free fluid identified. No sonographic evidence for ovarian torsion. Electronically Signed   By: Earle Gell M.D.   On: 08/01/2017 16:10   US Ob Transvaginal  Result Date: 08/01/2017 CLINICAL DATA:  Pelvic pain for 10 days. Complex left ovarian cystic lesion on recent ultrasound. Positive pregnancy test. Unknown LMP. EXAM: OBSTETRIC <14 WK [place] AND TRANSVAGINAL OB US DOPPLER ULTRASOUND OF OVARIES TECHNIQUE: Both transabdominal and transvaginal ultrasound examinations were performed for complete evaluation of the gestation as well as the maternal uterus, adnexal regions, and pelvic cul-de-sac. Transvaginal technique was performed to assess early pregnancy. Color and duplex Doppler ultrasound was utilized to evaluate blood flow to the ovaries. COMPARISON:  None. FINDINGS: Intrauterine gestational sac: Single Yolk sac:   Visualized. Embryo:  Visualized. Cardiac Activity: Not Visualized. CRL:   6  mm   6 w 2 d                  [place] EDC: 03/25/2018 Subchorionic hemorrhage:  None visualized. Maternal uterus/adnexae: Right ovarian corpus luteum cyst noted. Normal appearance of left ovary. No mass or abnormal free fluid identified. Pulsed Doppler evaluation of both ovaries demonstrates normal appearing low-resistance arterial and venous waveforms. IMPRESSION: Findings are highly suspicious but not yet definitive for failed pregnancy. Recommend follow-up US in 7 days for definitive diagnosis. This recommendation follows SRU consensus guidelines: Diagnostic Criteria for Nonviable Pregnancy Early in the First Trimester. [employer] Med 2013; 102:5852-77. No mass or free fluid identified. No sonographic evidence for ovarian torsion. Electronically Signed   By: Earle Gell M.D.   On: 08/01/2017 16:10   [place] Art/ven Flow Abd Pelv Doppler  Result Date: 08/01/2017 CLINICAL DATA:  Pelvic pain for 10 days. Complex left ovarian cystic lesion on recent ultrasound. Positive pregnancy test. Unknown LMP. EXAM: OBSTETRIC <14 WK [place] AND TRANSVAGINAL OB US DOPPLER ULTRASOUND OF OVARIES TECHNIQUE: Both transabdominal and transvaginal ultrasound examinations were performed for complete evaluation of the gestation as well as the maternal uterus, adnexal regions, and pelvic cul-de-sac. Transvaginal technique was performed to assess early pregnancy. Color and duplex Doppler ultrasound was utilized to evaluate blood flow to the ovaries. COMPARISON:  None. FINDINGS: Intrauterine gestational sac: Single Yolk sac:  Visualized. Embryo:  Visualized. Cardiac Activity: Not Visualized. CRL:   6  mm   6 w 2 d                  [place] EDC: 03/25/2018 Subchorionic hemorrhage:  None visualized. Maternal uterus/adnexae: Right ovarian corpus luteum cyst noted. Normal appearance of left ovary. No mass or abnormal free fluid identified. Pulsed Doppler evaluation of both ovaries  demonstrates normal appearing low-resistance arterial and venous waveforms. IMPRESSION: Findings are highly suspicious but not yet definitive for failed pregnancy. Recommend follow-up US in 7 days for definitive diagnosis. This recommendation follows SRU consensus guidelines: Diagnostic Criteria for Nonviable Pregnancy Early in the First Trimester. [employer] Med 2013; 824:2353-61. No mass or free fluid identified. No sonographic evidence for ovarian torsion. Electronically Signed   By: Earle Gell M.D.   On: 08/01/2017 16:10  Procedures Procedures (including critical care time)  Medications Ordered in ED Medications - No data to display   Initial Impression / Assessment and Plan / ED Course  I have reviewed the triage vital signs and the nursing notes.  Pertinent labs & imaging results that were available during my care of the patient were reviewed by me and considered in my medical decision making (see chart for details).  Clinical Course as of Aug 01 1716  Tue Aug 01, 2017  1343 Large pelvic mass on examination with pelvic exam, ultrasound ordered to follow-up on prior left adnexal/ovarian mass, on imaging from October 2018.  [EW]  1655 Preg Test, Ur: (!) POSITIVE [EW]    Clinical Course User Index [EW] Daleen Bo, MD     Patient Vitals for the past 24 hrs:  BP Temp Temp src Pulse Resp SpO2  08/01/17 1243 (!) 136/91 98.3 F (36.8 C) Oral 87 (!) 23 99 %  08/01/17 1137 (!) 140/97 - - (!) 103 18 99 %  08/01/17 0827 (!) 142/96 98.1 F (36.7 C) Oral (!) 105 18 97 %    4:54 PM Reevaluation with update and discussion. After initial assessment and treatment, an updated evaluation reveals patient is comfortable has no further complaints.  Findings discussed with the patient and all questions were answered. Daleen Bo      Final Clinical Impressions(s) / ED Diagnoses   Final diagnoses:  Less than [redacted] weeks gestation of pregnancy    Patient with perception of prolapse,  intravaginally, with findings of apparent enlarged and retroflexed uterus, associated with pregnancy, gestational sac present, without viable pregnancy.  Patient does not have vaginal bleeding.  Cyst, left adnexa, diagnosed, 2 months ago on pelvic ultrasound has entirely resolved.  Patient has a gynecologist for follow-up evaluation and treatment.  Nursing Notes Reviewed/ Care Coordinated Applicable Imaging Reviewed Interpretation of Laboratory Data incorporated into ED treatment  The patient appears reasonably screened and/or stabilized for discharge and I doubt any other medical condition or other [facility] requiring further screening, evaluation, or treatment in the ED at this time prior to discharge.  Plan: Home Medications-APAP for pain; Home Treatments-rest, fluids; return here if the recommended treatment, does not improve the symptoms; Recommended follow up-GYN follow-up 2 days for further testing, and repeat beta hCG   ED Discharge Orders    None       Daleen Bo, MD 08/02/17 [phone]

## 2017-08-01 NOTE — ED Notes (Signed)
Pt is extremely upset with the wait. I explained to pt that we are busy and that we are waiting on ED Provider. Pt made statement "All I need and want is prednisone". I informed pt that we, the Provider and RN can not simply just give medication and that RN can not legally due to scope of practice. Pt is still upset. I have informed ED Provider as he has been extremely busy as well.

## 2017-08-01 NOTE — ED Notes (Signed)
ED Provider at bedside. EDP WENTZ AT BEDSIDE PERFORMED PELVIC WITH Anna Lucero H NT

## 2017-08-01 NOTE — Discharge Instructions (Signed)
The ultrasound today indicated some findings for pregnancy, but no visible pregnancy.  It is important to follow-up with your gynecologist in 2 days for repeat evaluation probably to include blood testing and ongoing management.  Get plenty of rest drink a lot of fluids and use Tylenol as needed for pain or fever.  If you develop complications such as bleeding, or increased pain, or go to the [facility] [address] Admissions unit.

## 2017-08-01 NOTE — ED Notes (Signed)
Patient transported to Ultrasound 

## 2017-08-01 NOTE — ED Notes (Signed)
ED Provider at bedside. 

## 2017-08-01 NOTE — ED Notes (Signed)
RETURNED FROM ULTRASOUND

## 2017-08-01 NOTE — ED Triage Notes (Signed)
Patient c/o sinus like headache x 8 days, metallic taste in her mouth for week even after brushing teeth and eating and drinking.  Patient also reports that feels organ has collapsed into her vaginal area and called her doctor but cant been seen until later this week.

## 2017-08-01 NOTE — ED Notes (Signed)
Will obtain vital signs and blood work once patient returns to room.

## 2017-08-09 ENCOUNTER — Encounter (HOSPITAL_COMMUNITY): Payer: Self-pay | Admitting: *Deleted

## 2017-08-09 ENCOUNTER — Encounter (HOSPITAL_COMMUNITY)
Admission: AD | Disposition: A | Discharge status: Home / Self Care | Payer: Self-pay | Source: Ambulatory Visit | Attending: Obstetrics and Gynecology

## 2017-08-09 ENCOUNTER — Ambulatory Visit (HOSPITAL_COMMUNITY)
Admission: AD | Admit: 2017-08-09 | Discharge: 2017-08-09 | Disposition: A | Discharge status: Home / Self Care | Payer: BLUE CROSS/BLUE SHIELD | Source: Ambulatory Visit | Attending: Obstetrics and Gynecology | Admitting: Obstetrics and Gynecology

## 2017-08-09 ENCOUNTER — Other Ambulatory Visit: Payer: Self-pay

## 2017-08-09 ENCOUNTER — Inpatient Hospital Stay (HOSPITAL_COMMUNITY): Payer: BLUE CROSS/BLUE SHIELD | Admitting: Anesthesiology

## 2017-08-09 DIAGNOSIS — K219 Gastro-esophageal reflux disease without esophagitis: Secondary | ICD-10-CM | POA: Diagnosis not present

## 2017-08-09 DIAGNOSIS — M069 Rheumatoid arthritis, unspecified: Secondary | ICD-10-CM | POA: Diagnosis not present

## 2017-08-09 DIAGNOSIS — H209 Unspecified iridocyclitis: Secondary | ICD-10-CM | POA: Insufficient documentation

## 2017-08-09 DIAGNOSIS — O021 Missed abortion: Secondary | ICD-10-CM | POA: Insufficient documentation

## 2017-08-09 DIAGNOSIS — E282 Polycystic ovarian syndrome: Secondary | ICD-10-CM | POA: Diagnosis not present

## 2017-08-09 DIAGNOSIS — Z79899 Other long term (current) drug therapy: Secondary | ICD-10-CM | POA: Insufficient documentation

## 2017-08-09 HISTORY — PX: DILATION AND EVACUATION: SHX1459

## 2017-08-09 LAB — CBC
HCT: 35.5 % — ABNORMAL LOW (ref 36.0–46.0)
Hemoglobin: 10.9 g/dL — ABNORMAL LOW (ref 12.0–15.0)
MCH: 22.8 pg — AB (ref 26.0–34.0)
MCHC: 30.7 g/dL (ref 30.0–36.0)
MCV: 74.3 fL — ABNORMAL LOW (ref 78.0–100.0)
PLATELETS: 342 10*3/uL (ref 150–400)
RBC: 4.78 MIL/uL (ref 3.87–5.11)
RDW: 17.2 % — ABNORMAL HIGH (ref 11.5–15.5)
WBC: 18.6 10*3/uL — ABNORMAL HIGH (ref 4.0–10.5)

## 2017-08-09 LAB — URINALYSIS, ROUTINE W REFLEX MICROSCOPIC
BILIRUBIN URINE: NEGATIVE
Bacteria, UA: NONE SEEN
Glucose, UA: NEGATIVE mg/dL
Hgb urine dipstick: NEGATIVE
Ketones, ur: NEGATIVE mg/dL
Nitrite: NEGATIVE
PH: 5 (ref 5.0–8.0)
Protein, ur: 30 mg/dL — AB
SPECIFIC GRAVITY, URINE: 1.034 — AB (ref 1.005–1.030)

## 2017-08-09 LAB — TYPE AND SCREEN
ABO/RH(D): B POS
ANTIBODY SCREEN: NEGATIVE

## 2017-08-09 LAB — ABO/RH: ABO/RH(D): B POS

## 2017-08-09 SURGERY — DILATION AND EVACUATION, UTERUS
Anesthesia: Monitor Anesthesia Care

## 2017-08-09 MED ORDER — PROPOFOL 500 MG/50ML IV EMUL
INTRAVENOUS | Status: DC | PRN
  Administered 2017-08-09 (×2): 50 mg via INTRAVENOUS

## 2017-08-09 MED ORDER — FENTANYL CITRATE (PF) 100 MCG/2ML IJ SOLN
INTRAMUSCULAR | Status: DC | PRN
  Administered 2017-08-09 (×2): 50 ug via INTRAVENOUS

## 2017-08-09 MED ORDER — LIDOCAINE HCL (CARDIAC) 20 MG/ML IV SOLN
INTRAVENOUS | Status: AC
  Filled 2017-08-09: qty 5

## 2017-08-09 MED ORDER — ONDANSETRON HCL 4 MG/2ML IJ SOLN
INTRAMUSCULAR | Status: AC
  Filled 2017-08-09: qty 2

## 2017-08-09 MED ORDER — MIDAZOLAM HCL 2 MG/2ML IJ SOLN
INTRAMUSCULAR | Status: AC
  Filled 2017-08-09: qty 2

## 2017-08-09 MED ORDER — LIDOCAINE HCL 1 % IJ SOLN
INTRAMUSCULAR | Status: AC
  Filled 2017-08-09: qty 20

## 2017-08-09 MED ORDER — METHYLPREDNISOLONE SODIUM SUCC 125 MG IJ SOLR
INTRAMUSCULAR | Status: AC
  Filled 2017-08-09: qty 2

## 2017-08-09 MED ORDER — DOXYCYCLINE HYCLATE 100 MG PO TABS: 100.0000 mg | ORAL_TABLET | Freq: Once | ORAL | 0 refills | Status: AC

## 2017-08-09 MED ORDER — DOXYCYCLINE HYCLATE 100 MG PO TABS: 100.0000 mg | ORAL_TABLET | Freq: Once | ORAL | Status: DC

## 2017-08-09 MED ORDER — SOD CITRATE-CITRIC ACID 500-334 MG/5ML PO SOLN
30.0000 mL | Freq: Once | ORAL | Status: AC
  Administered 2017-08-09: 30 mL via ORAL
  Filled 2017-08-09: qty 15

## 2017-08-09 MED ORDER — DOXYCYCLINE HYCLATE 100 MG IV SOLR
200.0000 mg | Freq: Once | INTRAVENOUS | Status: DC
  Filled 2017-08-09: qty 200

## 2017-08-09 MED ORDER — HYDROCORTISONE NA SUCCINATE PF 100 MG IJ SOLR
100.0000 mg | Freq: Once | INTRAMUSCULAR | Status: AC
  Administered 2017-08-09: 100 mg via INTRAVENOUS
  Filled 2017-08-09: qty 2

## 2017-08-09 MED ORDER — LACTATED RINGERS IV SOLN
INTRAVENOUS | Status: DC
  Administered 2017-08-09: 11:00:00 via INTRAVENOUS

## 2017-08-09 MED ORDER — MIDAZOLAM HCL 2 MG/2ML IJ SOLN
INTRAMUSCULAR | Status: DC | PRN
  Administered 2017-08-09: 2 mg via INTRAVENOUS

## 2017-08-09 MED ORDER — OXYCODONE-ACETAMINOPHEN 5-325 MG PO TABS: 1.0000 | ORAL_TABLET | Freq: Four times a day (QID) | ORAL | 0 refills | Status: DC | PRN

## 2017-08-09 MED ORDER — DEXAMETHASONE SODIUM PHOSPHATE 4 MG/ML IJ SOLN
INTRAMUSCULAR | Status: AC
  Filled 2017-08-09: qty 1

## 2017-08-09 MED ORDER — PROPOFOL 500 MG/50ML IV EMUL
INTRAVENOUS | Status: DC | PRN
  Administered 2017-08-09: 150 ug/kg/min via INTRAVENOUS

## 2017-08-09 MED ORDER — OXYCODONE-ACETAMINOPHEN 5-325 MG PO TABS: 1.0000 | ORAL_TABLET | Freq: Four times a day (QID) | ORAL | Status: DC | PRN

## 2017-08-09 MED ORDER — PROPOFOL 10 MG/ML IV BOLUS
INTRAVENOUS | Status: AC
  Filled 2017-08-09: qty 40

## 2017-08-09 MED ORDER — ONDANSETRON HCL 4 MG/2ML IJ SOLN
INTRAMUSCULAR | Status: DC | PRN
  Administered 2017-08-09: 4 mg via INTRAVENOUS

## 2017-08-09 MED ORDER — FAMOTIDINE IN NACL 20-0.9 MG/50ML-% IV SOLN
20.0000 mg | Freq: Once | INTRAVENOUS | Status: AC
  Administered 2017-08-09: 20 mg via INTRAVENOUS
  Filled 2017-08-09: qty 50

## 2017-08-09 MED ORDER — LIDOCAINE HCL 1 % IJ SOLN
INTRAMUSCULAR | Status: DC | PRN
  Administered 2017-08-09: 10 mL

## 2017-08-09 MED ORDER — FENTANYL CITRATE (PF) 100 MCG/2ML IJ SOLN
INTRAMUSCULAR | Status: AC
  Filled 2017-08-09: qty 2

## 2017-08-09 SURGICAL SUPPLY — 19 items
CATH ROBINSON RED A/P 16FR (CATHETERS) ×2 IMPLANT
DECANTER SPIKE VIAL GLASS SM (MISCELLANEOUS) ×2 IMPLANT
GLOVE BIOGEL PI IND STRL 6.5 (GLOVE) ×1 IMPLANT
GLOVE BIOGEL PI IND STRL 7.0 (GLOVE) ×1 IMPLANT
GLOVE BIOGEL PI INDICATOR 6.5 (GLOVE) ×1
GLOVE BIOGEL PI INDICATOR 7.0 (GLOVE) ×1
GLOVE ECLIPSE 6.5 STRL STRAW (GLOVE) ×2 IMPLANT
GOWN STRL REUS W/TWL LRG LVL3 (GOWN DISPOSABLE) ×4 IMPLANT
KIT BERKELEY 1ST TRIMESTER 3/8 (MISCELLANEOUS) ×2 IMPLANT
NS IRRIG 1000ML POUR BTL (IV SOLUTION) ×2 IMPLANT
PACK VAGINAL MINOR WOMEN LF (CUSTOM PROCEDURE TRAY) ×2 IMPLANT
PAD OB MATERNITY 4.3X12.25 (PERSONAL CARE ITEMS) ×2 IMPLANT
PAD PREP 24X48 CUFFED NSTRL (MISCELLANEOUS) ×2 IMPLANT
SET BERKELEY SUCTION TUBING (SUCTIONS) ×2 IMPLANT
TOWEL OR 17X24 6PK STRL BLUE (TOWEL DISPOSABLE) ×4 IMPLANT
VACURETTE 10 RIGID CVD (CANNULA) IMPLANT
VACURETTE 7MM CVD STRL WRAP (CANNULA) ×2 IMPLANT
VACURETTE 8 RIGID CVD (CANNULA) IMPLANT
VACURETTE 9 RIGID CVD (CANNULA) IMPLANT

## 2017-08-09 NOTE — H&P (Signed)
42 y.o. complains of 6.0 MAB documented by [place] yesterday.  Pt presents today with spotting and pain and desirous of D&E.  History of 3 prior vaginal deliveries.  Past Medical History:  Diagnosis Date  . GERD (gastroesophageal reflux disease)   . Iritis   . PCOS (polycystic ovarian syndrome)   . RA (rheumatoid arthritis) ([place])    Past Surgical History:  Procedure Laterality Date  . abdominal plasty    . BREAST REDUCTION SURGERY    . filopian tube removal      Social History   Socioeconomic History  . Marital status: Married    Spouse name: Not on file  . Number of children: Not on file  . Years of education: Not on file  . Highest education level: Not on file  Social Needs  . Financial resource strain: Not on file  . Food insecurity - worry: Not on file  . Food insecurity - inability: Not on file  . Transportation needs - medical: Not on file  . Transportation needs - non-medical: Not on file  Occupational History  . Not on file  Tobacco Use  . Smoking status: Never Smoker  . Smokeless tobacco: Never Used  Substance and Sexual Activity  . Alcohol use: No  . Drug use: No  . Sexual activity: Not on file  Other Topics Concern  . Not on file  Social History Narrative  . Not on file    No current facility-administered medications on file prior to encounter.    Current Outpatient Medications on File Prior to Encounter  Medication Sig Dispense Refill  . EVENING PRIMROSE OIL PO Take 2 capsules by mouth daily.    Marland Kitchen ibuprofen (ADVIL,MOTRIN) 200 MG tablet Take 600 mg by mouth every 6 (six) hours as needed for moderate pain.    . Menthol, Topical Analgesic, (BIOFREEZE ROLL-ON) 4 % GEL Apply 1 application topically as needed (pain).    . naproxen (NAPROSYN) 500 MG tablet Take 1 tablet (500 mg total) by mouth 2 (two) times daily. 30 tablet 0  . omeprazole (PRILOSEC) 20 MG capsule Take 20 mg by mouth 3 (three) times a week.    . predniSONE (DELTASONE) 20 MG tablet Take 0.5  tablets (10 mg total) by mouth daily with breakfast. (Patient taking differently: Take 20 mg by mouth daily with breakfast. ) 15 tablet 0  . traMADol (ULTRAM) 50 MG tablet Take 1 tablet (50 mg total) by mouth every 6 (six) hours as needed. (Patient not taking: Reported on 08/01/2017) 15 tablet 0    Allergies  Allergen Reactions  . Celebrex [Celecoxib]     Vitals:   08/09/17 0822  BP: 140/89  Pulse: 100  Resp: 18  Temp: 98.9 F (37.2 C)  TempSrc: Oral  SpO2: 100%  Weight: 88.8 kg (195 lb 12 oz)    Lungs: clear to ascultation Cor:  RRR Abdomen:  soft, nontender, nondistended. Ex:  no cords, erythema Pelvic:  Deferred to OR  A:  6.0week MAB desiring D&E    P:  Discussed expectant mgmt vs cytotec vs D&E with risks benefits and potential complications of each option.  Discussed specifically issues related to patient's past medical history and increased risk of uterine perforation with a retroverted uterus.  Discussed also possible outcome of attempting D&E and failing to complete.  Also discussed risks of infection, hemorrhaging, damage to uterus or uterine lining. Discussed pt's RA history with anesthesia and current use of prednisone daily, they will administer stress dose steroids  and place that order. T&S and CBC  IV placement another other routine pre-op care.  All risks, benefits and alternatives d/w patient and she desires to proceed.     Allyn Kenner

## 2017-08-09 NOTE — MAU Note (Signed)
Was at the hosp on [facility] day, dx with a failed preg.  Has follow up at office x2; +UPT and [place] yesterday that confirmed fetal pole, measuring 6wks with no cardiac activity.  Yesterday after [place] she started cramping and spotting.  Has a hx of uterine prolapse, makes it difficult to pee. Has also been nauseated.

## 2017-08-09 NOTE — Anesthesia Preprocedure Evaluation (Signed)
Anesthesia Evaluation  Patient identified by MRN, date of birth, ID band Patient awake    Reviewed: Allergy & Precautions, NPO status , Patient's Chart, lab work & pertinent test results  Airway Mallampati: II  TM Distance: >3 FB Neck ROM: Full    Dental  (+) Dental Advisory Given   Pulmonary neg pulmonary ROS,    Pulmonary exam normal breath sounds clear to auscultation       Cardiovascular negative cardio ROS Normal cardiovascular exam Rhythm:Regular Rate:Normal     Neuro/Psych negative neurological ROS  negative psych ROS   GI/Hepatic negative GI ROS, Neg liver ROS, GERD  ,  Endo/Other  Obesity  Renal/GU negative Renal ROS  negative genitourinary   Musculoskeletal  (+) Arthritis , Rheumatoid disorders,    Abdominal   Peds  Hematology negative hematology ROS (+)   Anesthesia Other Findings   Reproductive/Obstetrics Missed abortion; PCOS                             Anesthesia Physical Anesthesia Plan  ASA: II  Anesthesia Plan: MAC   Post-op Pain Management:    Induction: Intravenous  PONV Risk Score and Plan: Propofol infusion and Treatment may vary due to age or medical condition  Airway Management Planned: Nasal Cannula  Additional Equipment: None  Intra-op Plan:   Post-operative Plan:   Informed Consent: I have reviewed the patients History and Physical, chart, labs and discussed the procedure including the risks, benefits and alternatives for the proposed anesthesia with the patient or authorized representative who has indicated his/her understanding and acceptance.   Dental advisory given  Plan Discussed with: CRNA  Anesthesia Plan Comments: (Will give solucortef to cover patient on long term daily prednisone)        Anesthesia Quick Evaluation

## 2017-08-09 NOTE — Anesthesia Postprocedure Evaluation (Signed)
Anesthesia Post Note  Patient: Anna Lucero  Procedure(s) Performed: DILATATION AND EVACUATION (N/A )     Patient location during evaluation: PACU Anesthesia Type: MAC Level of consciousness: awake and alert Pain management: pain level controlled Vital Signs Assessment: post-procedure vital signs reviewed and stable Respiratory status: spontaneous breathing, nonlabored ventilation and respiratory function stable Cardiovascular status: stable and blood pressure returned to baseline Anesthetic complications: no    Last Vitals:  Vitals:   08/09/17 1230 08/09/17 1300  BP: 125/88 134/89  Pulse: 81 80  Resp: 19 (!) 21  Temp:  37.1 C  SpO2: 99% 96%             Audry Pili

## 2017-08-09 NOTE — MAU Provider Note (Signed)
Patient Anna Lucero is a 42 y.o. [phone] At [redacted]w[redacted]d by LMP but with a 6 week failed IUP here for follow-up as she started having cramping last night.   She has a known failed pregnancy; she was seen by Dr. Deatra Ina yesterday and the failed pregnancy was confirmed. She is here because she is scared of passing the pregnancy at home and she doesn't want to wait until next week for a curretage. She is also unsure about taking the misoprostal at home.  History     CSN: [id number]  Arrival date and time: 08/09/17 2542   First Provider Initiated Contact with Patient 08/09/17 [phone]      Chief Complaint  Patient presents with  . Vaginal Bleeding  . Abdominal Pain   Abdominal Pain  This is a new problem. The current episode started today. The problem occurs constantly. The pain is at a severity of 6/10. The quality of the pain is cramping. The abdominal pain does not radiate. Pertinent negatives include no diarrhea, nausea or vomiting. The pain is aggravated by movement. The pain is relieved by nothing.    OB History    Gravida Para Term Preterm AB Living   6 3     2 3    SAB TAB Ectopic Multiple Live Births     1 1          Past Medical History:  Diagnosis Date  . GERD (gastroesophageal reflux disease)   . Iritis   . PCOS (polycystic ovarian syndrome)   . RA (rheumatoid arthritis) ([place])     Past Surgical History:  Procedure Laterality Date  . abdominal plasty    . BREAST REDUCTION SURGERY    . filopian tube removal      History reviewed. No pertinent family history.  Social History   Tobacco Use  . Smoking status: Never Smoker  . Smokeless tobacco: Never Used  Substance Use Topics  . Alcohol use: No  . Drug use: No    Allergies:  Allergies  Allergen Reactions  . Celebrex [Celecoxib]     Medications Prior to Admission  Medication Sig Dispense Refill Last Dose  . EVENING PRIMROSE OIL PO Take 2 capsules by mouth daily.   07/31/2017 at Unknown time  . ibuprofen  (ADVIL,MOTRIN) 200 MG tablet Take 600 mg by mouth every 6 (six) hours as needed for moderate pain.   Past Week at Unknown time  . Menthol, Topical Analgesic, (BIOFREEZE ROLL-ON) 4 % GEL Apply 1 application topically as needed (pain).   Past Week at Unknown time  . naproxen (NAPROSYN) 500 MG tablet Take 1 tablet (500 mg total) by mouth 2 (two) times daily. 30 tablet 0 07/31/2017 at Unknown time  . omeprazole (PRILOSEC) 20 MG capsule Take 20 mg by mouth 3 (three) times a week.   08/01/2017 at Unknown time  . predniSONE (DELTASONE) 20 MG tablet Take 0.5 tablets (10 mg total) by mouth daily with breakfast. (Patient taking differently: Take 20 mg by mouth daily with breakfast. ) 15 tablet 0 07/31/2017 at Unknown time  . traMADol (ULTRAM) 50 MG tablet Take 1 tablet (50 mg total) by mouth every 6 (six) hours as needed. (Patient not taking: Reported on 08/01/2017) 15 tablet 0 Not Taking at Unknown time    Review of Systems  Constitutional: Negative.   HENT: Negative.   Respiratory: Negative.   Cardiovascular: Negative.   Gastrointestinal: Positive for abdominal pain. Negative for diarrhea, nausea and vomiting.  Genitourinary: Negative.   Musculoskeletal:  Negative.   Neurological: Negative.    Physical Exam   Blood pressure 140/89, pulse 100, temperature 98.9 F (37.2 C), temperature source Oral, resp. rate 18, weight 195 lb 12 oz (88.8 kg), last menstrual period 05/23/2017, SpO2 100 %.  Physical Exam  Constitutional: She is oriented to person, place, and time. She appears well-developed and well-nourished.  HENT:  Head: Normocephalic.  Neck: Normal range of motion.  Respiratory: No respiratory distress.  GI: Soft.  Genitourinary: Vagina normal.  Genitourinary Comments: NEFG; cervix is anterior, long and fingertip. No CMT, slight suprapubic tenderness, no adnexal tenderness.   Musculoskeletal: Normal range of motion.  Neurological: She is alert and oriented to person, place, and time.  Skin:  Skin is warm and dry.  Psychiatric: She has a normal mood and affect.    MAU Course  Procedures  MDM -Phone call to Dr. Rogue Bussing to discuss; Dr. Rogue Bussing at the bedside to evaluate.   Assessment and Plan  -Plan for D and C today; IV started and CBC and Type and Screen drawn   Mervyn Skeeters [facility] 08/09/2017, 9:36 AM

## 2017-08-09 NOTE — Discharge Summary (Signed)
Pt presented for spotting with known MAB, desiring D&E.  See H&P and op note for details.  Anticipate d/c home after PACU with Rx for #10percocet and #1 doxycline tab.  Instructions given.  Pt tolerated procedure well.

## 2017-08-09 NOTE — Discharge Instructions (Signed)

## 2017-08-09 NOTE — Transfer of Care (Signed)
Immediate Anesthesia Transfer of Care Note  Patient: [name]  Procedure(s) Performed: DILATATION AND EVACUATION (N/A )  Patient Location: PACU  Anesthesia Type:MAC  Level of Consciousness: awake and alert   Airway & Oxygen Therapy: Patient Spontanous Breathing  Post-op Assessment: Report given to RN and Post -op Vital signs reviewed and stable  Post vital signs: Reviewed and stable  Last Vitals:  Vitals:   08/09/17 0822 08/09/17 1035  BP: 140/89 (!) 152/96  Pulse: 100 96  Resp: 18   Temp: 37.2 C   SpO2: 100%     Last Pain:  Vitals:   08/09/17 0823  TempSrc:   PainSc: 9          Complications: No apparent anesthesia complications

## 2017-08-09 NOTE — Brief Op Note (Signed)
08/09/2017  11:54 AM  PATIENT:  Anna Lucero  42 y.o. female  PRE-OPERATIVE DIAGNOSIS:  missed ab at 6 weeks  POST-OPERATIVE DIAGNOSIS:  missed ab at 6 weeks  PROCEDURE:  Procedure(s): DILATATION AND EVACUATION (N/A)  SURGEON:  Surgeon(s) and Role:    Allyn Kenner, DO - Primary  ANESTHESIA:   local and MAC  EBL:  minimal  LOCAL MEDICATIONS USED:  LIDOCAINE  and Amount: 10 ml  SPECIMEN:  Source of Specimen:  POC  DISPOSITION OF SPECIMEN:  PATHOLOGY  COUNTS:  YES  PLAN OF CARE: Discharge to home after PACU  PATIENT DISPOSITION:  PACU - hemodynamically stable.   FINDINGS: RV uterus, moderate POC visually removed, normal appearance of vulva, vagina, cervix (with nabothean cysts)

## 2017-08-10 ENCOUNTER — Encounter (HOSPITAL_COMMUNITY): Payer: Self-pay | Admitting: Obstetrics and Gynecology

## 2017-08-10 NOTE — Op Note (Signed)
[name], Anna Lucero                ACCOUNT NO.:  [id number]  MEDICAL RECORD NO.:  [id number]  LOCATION:                                 FACILITY:  PHYSICIAN:  Allyn Kenner, DO    DATE OF BIRTH:  07/21/1976  DATE OF PROCEDURE: DATE OF DISCHARGE:                              OPERATIVE REPORT   PREOPERATIVE DIAGNOSIS:  Six-week missed abortion.  POSTOPERATIVE DIAGNOSIS:  Six-week missed abortion.  PROCEDURE:  Dilation and evacuation.  SURGEON:  Allyn Kenner, DO  ANESTHESIA:  MAC and local.  Local medications used 10 mL lidocaine 1% paracervical.  ESTIMATED BLOOD LOSS:  Minimal.  SPECIMENS:  Products of conception to Pathology.  FINDINGS:  Retroverted uterus.  Moderate products of conception visually removed.  Normal appearance of vulva, vagina, and cervix with nabothian cyst visualized.  DESCRIPTION OF PROCEDURE:  After risks, benefits, and alternatives were reviewed with the patient, she was consented for a dilation and evacuation.  She was brought back to the operating room, where anesthesia was administered and found to be adequate.  She was prepped and draped in a normal sterile fashion and bladder decompressed with catheterization.  A speculum was placed in the vagina and visualization of the cervix was easily obtained.  Anterior lip of the cervix was grasped with a single-tooth tenaculum and the cervix was easily dilated to 23 Pratt with no need for any pressure to be placed.  Cervix was already accommodating this size dilator.  An 8 curved curette was advanced gently to the fundus.  The suction attached and with adequate pressure, 3 passes of the curved curette suction evacuation were performed with moderate tissue removed.  Curette was removed and a sharp curettage of all 4 quadrants was performed gently.  Minimal-to-moderate blood clot was evacuated by this method. One final passage of the curved suction curette was performed.  No additional bleeding was  noted.  The single-tooth tenaculum was removed and pressure on tenaculum sites created hemostasis.  The patient tolerated the procedure well.  Sponge, lap, and needle counts were correct x2.  The patient was taken to recovery in stable condition.    ______________________________ Allyn Kenner, DO   ______________________________ Allyn Kenner, DO    [place]/MEDQ  D:  08/09/2017  T:  08/09/2017  Job:  [id number]

## 2017-08-30 DIAGNOSIS — O039 Complete or unspecified spontaneous abortion without complication: Secondary | ICD-10-CM | POA: Insufficient documentation

## 2017-09-26 DIAGNOSIS — N814 Uterovaginal prolapse, unspecified: Secondary | ICD-10-CM | POA: Insufficient documentation

## 2017-10-18 ENCOUNTER — Emergency Department (HOSPITAL_COMMUNITY): Payer: BLUE CROSS/BLUE SHIELD

## 2017-10-18 ENCOUNTER — Emergency Department (HOSPITAL_COMMUNITY)
Admission: EM | Admit: 2017-10-18 | Discharge: 2017-10-19 | Discharge status: Against Medical Advice | Payer: BLUE CROSS/BLUE SHIELD | Attending: Emergency Medicine | Admitting: Emergency Medicine

## 2017-10-18 ENCOUNTER — Other Ambulatory Visit: Payer: Self-pay

## 2017-10-18 ENCOUNTER — Encounter (HOSPITAL_COMMUNITY): Payer: Self-pay

## 2017-10-18 DIAGNOSIS — H532 Diplopia: Secondary | ICD-10-CM | POA: Insufficient documentation

## 2017-10-18 DIAGNOSIS — Z79899 Other long term (current) drug therapy: Secondary | ICD-10-CM | POA: Insufficient documentation

## 2017-10-18 DIAGNOSIS — H5111 Convergence insufficiency: Secondary | ICD-10-CM | POA: Diagnosis not present

## 2017-10-18 DIAGNOSIS — H518 Other specified disorders of binocular movement: Secondary | ICD-10-CM

## 2017-10-18 DIAGNOSIS — H538 Other visual disturbances: Secondary | ICD-10-CM | POA: Diagnosis present

## 2017-10-18 LAB — URINALYSIS, ROUTINE W REFLEX MICROSCOPIC
BILIRUBIN URINE: NEGATIVE
Bacteria, UA: NONE SEEN
GLUCOSE, UA: NEGATIVE mg/dL
Ketones, ur: 5 mg/dL — AB
Nitrite: NEGATIVE
PH: 7 (ref 5.0–8.0)
Protein, ur: 30 mg/dL — AB
SPECIFIC GRAVITY, URINE: 1.025 (ref 1.005–1.030)

## 2017-10-18 LAB — PROTIME-INR
INR: 0.95
Prothrombin Time: 12.6 seconds (ref 11.4–15.2)

## 2017-10-18 LAB — COMPREHENSIVE METABOLIC PANEL
ALK PHOS: 51 U/L (ref 38–126)
ALT: 18 U/L (ref 14–54)
ANION GAP: 8 (ref 5–15)
AST: 17 U/L (ref 15–41)
Albumin: 3.8 g/dL (ref 3.5–5.0)
BILIRUBIN TOTAL: 0.6 mg/dL (ref 0.3–1.2)
BUN: 13 mg/dL (ref 6–20)
CALCIUM: 9.3 mg/dL (ref 8.9–10.3)
CO2: 24 mmol/L (ref 22–32)
Chloride: 105 mmol/L (ref 101–111)
Creatinine, Ser: 0.85 mg/dL (ref 0.44–1.00)
GFR calc Af Amer: >60 mL/min (ref >60)
GFR calc non Af Amer: >60 mL/min (ref >60)
Glucose, Bld: 101 mg/dL — ABNORMAL HIGH (ref 65–99)
Potassium: 4 mmol/L (ref 3.5–5.1)
SODIUM: 137 mmol/L (ref 135–145)
TOTAL PROTEIN: 7.3 g/dL (ref 6.5–8.1)

## 2017-10-18 LAB — CBC
HEMATOCRIT: 35.3 % — AB (ref 36.0–46.0)
HEMOGLOBIN: 10.4 g/dL — AB (ref 12.0–15.0)
MCH: 22 pg — ABNORMAL LOW (ref 26.0–34.0)
MCHC: 29.5 g/dL — AB (ref 30.0–36.0)
MCV: 74.8 fL — AB (ref 78.0–100.0)
Platelets: 348 10*3/uL (ref 150–400)
RBC: 4.72 MIL/uL (ref 3.87–5.11)
RDW: 18.3 % — AB (ref 11.5–15.5)
WBC: 12.7 10*3/uL — ABNORMAL HIGH (ref 4.0–10.5)

## 2017-10-18 LAB — CK: Total CK: 87 U/L (ref 38–234)

## 2017-10-18 LAB — SEDIMENTATION RATE: SED RATE: 10 mm/h (ref 0–22)

## 2017-10-18 LAB — I-STAT BETA HCG BLOOD, ED (MC, WL, AP ONLY)

## 2017-10-18 LAB — APTT: aPTT: 26 seconds (ref 24–36)

## 2017-10-18 LAB — TSH: TSH: 0.311 u[IU]/mL — ABNORMAL LOW (ref 0.350–4.500)

## 2017-10-18 MED ORDER — GADOBENATE DIMEGLUMINE 529 MG/ML IV SOLN
20.0000 mL | Freq: Once | INTRAVENOUS | Status: AC | PRN
  Administered 2017-10-18: 17 mL via INTRAVENOUS

## 2017-10-18 NOTE — ED Triage Notes (Signed)
Pt sent by her opthamalogist for MRI due to acute blurred vision that started 5 days ago. Pt has hx of RA and takes remicade infusions. Pt is alert and oriented and states she cannot currently see anything without her glasses.

## 2017-10-18 NOTE — ED Notes (Addendum)
Pt in MRI.

## 2017-10-18 NOTE — ED Provider Notes (Signed)
Patient placed in Quick Look pathway, seen and evaluated   Chief Complaint: double vision; sent to ED by Dr. Manuella Ghazi [facility]) requesting MRI brain and orbit w/wo contrast and myasthenia gravis panel  HPI:   Onset double vision 5 days ago associated with stabbing eye pain, evaluated by Dr. Manuella Ghazi today who sent pt to ED for further evaluation of possible acute demyelinating disease process.  She has h/o of RA and on remicade infusions.   ROS: No headache, nausea, vomiting, unilateral numbness or weakness.   Physical Exam:   Gen: No distress  Neuro: Awake and Alert  Skin: Warm    Focused Exam: Dilated pupils. R eye medial deviation. Pt reports double vision with both eyes open, normal with one uncovered eye. Speech without dysarthria. Strength 5/5 with hand grip and ankle F/E.  Sensation to light touch intact in hands and feet. CN V light touch intact in all 3 divisions of trigeminal nerve. CN VII facial nerve movements intact, symmetric, bilaterally. CN VIII not tested. CN IX, X no uvula deviation, symmetric soft palate/uvula rise. CN XI 5/5 SCM and trapezius strength bilaterally. CN XII Tongue midline with symmetric L/R movement  Myasthenia gravis order set and MRIs ordered.  Dr Manuella Ghazi cell phone [phone]. Initiation of care has begun. The patient has been counseled on the process, plan, and necessity for staying for the completion/evaluation, and the remainder of the medical screening-examination    Kinnie Feil, PA-C 10/18/17 [place], [place], DO 10/18/17 2253

## 2017-10-19 LAB — CALCIUM, IONIZED: Calcium, Ionized, Serum: 5.4 mg/dL (ref 4.5–5.6)

## 2017-10-19 LAB — STRIATED MUSCLE ANTIBODY: Anti-striation Abs: NEGATIVE

## 2017-10-19 NOTE — ED Provider Notes (Signed)
TIME SEEN: 12:15 AM  CHIEF COMPLAINT: Double vision, blurry vision  HPI: Patient is a 42 year old female with history of rheumatoid arthritis who recently started Remicade infusions who presents to the emergency department with 7 months of bilateral diplopia and blurry vision.  States she does have to wear glasses and feels like she is having to wear her glasses more frequently.  No vision loss.  No headache.  No head injury.  Not on blood thinners.  No numbness, tingling or focal weakness.  Seen by her ophthalmologist Dr. Tama High who recommended she come to the emergency department to rule out demyelinating disorder.  Labs, MRIs have been ordered in triage.  ROS: See HPI Constitutional: no fever  Eyes: no drainage  ENT: no runny nose   Cardiovascular:  no chest pain  Resp: no SOB  GI: no vomiting GU: no dysuria Integumentary: no rash  Allergy: no hives  Musculoskeletal: no leg swelling  Neurological: no slurred speech ROS otherwise negative  PAST MEDICAL HISTORY/PAST SURGICAL HISTORY:  Past Medical History:  Diagnosis Date  . GERD (gastroesophageal reflux disease)   . Iritis   . PCOS (polycystic ovarian syndrome)   . RA (rheumatoid arthritis) ([facility])     MEDICATIONS:  Prior to Admission medications   Medication Sig Start Date End Date Taking? Authorizing Provider  ibuprofen (ADVIL,MOTRIN) 200 MG tablet Take 600 mg by mouth every 6 (six) hours as needed for moderate pain.    [provider]  omeprazole (PRILOSEC) 20 MG capsule Take 20 mg by mouth every 3 (three) days.     [provider]  oxyCODONE-acetaminophen (PERCOCET/ROXICET) 5-325 MG tablet Take 1-2 tablets by mouth every 6 (six) hours as needed for severe pain. 08/09/17   Allyn Kenner, DO  predniSONE (DELTASONE) 20 MG tablet Take 0.5 tablets (10 mg total) by mouth daily with breakfast. Patient taking differently: Take 20 mg by mouth daily with breakfast.  04/02/17   Orlie Dakin, MD     ALLERGIES:  Allergies  Allergen Reactions  . Celebrex [Celecoxib] Other (See Comments)    Chest pain    SOCIAL HISTORY:  Social History   Tobacco Use  . Smoking status: Never Smoker  . Smokeless tobacco: Never Used  Substance Use Topics  . Alcohol use: No    FAMILY HISTORY: History reviewed. No pertinent family history.  EXAM: BP 135/81 (BP Location: Right Arm)   Pulse 93   Temp 99 F (37.2 C) (Oral)   Resp 18   LMP 05/15/2017   SpO2 100%   Breastfeeding? Unknown  CONSTITUTIONAL: Alert and oriented and responds appropriately to questions. Well-appearing; well-nourished HEAD: Normocephalic EYES: Conjunctivae clear, pupils appear equal, EOMI, patient will have both eyes go medially intermittently like she is crossing her eyes and this seems almost intentional, reports bilateral monocular diplopia ENT: normal nose; moist mucous membranes NECK: Supple, no meningismus, no nuchal rigidity, no LAD  CARD: RRR; S1 and S2 appreciated; no murmurs, no clicks, no rubs, no gallops RESP: Normal chest excursion without splinting or tachypnea; breath sounds clear and equal bilaterally; no wheezes, no rhonchi, no rales, no hypoxia or respiratory distress, speaking full sentences ABD/GI: Normal bowel sounds; non-distended; soft, non-tender, no rebound, no guarding, no peritoneal signs, no hepatosplenomegaly BACK:  The back appears normal and is non-tender to palpation, there is no CVA tenderness EXT: Normal ROM in all joints; non-tender to palpation; no edema; normal capillary refill; no cyanosis, no calf tenderness or swelling    SKIN: Normal color for  age and race; warm; no rash NEURO: Moves all extremities equally, strength 5/5 in all 4 extremities, cranial nerves II through XII intact, normal sensation diffusely, normal speech, normal gait PSYCH: The patient's mood and manner are appropriate. Grooming and personal hygiene are appropriate.  MEDICAL DECISION MAKING: Patient here with  bilateral monocular diplopia and crusting of both eyes medially intermittently.  Has been ongoing for 7 months but worsened over the past 5 days.  Sent here by her ophthalmologist.  I have called her ophthalmologist and he stated that patient had completely normal eye exam.  Patient has patient has been unremarkable other than a low TSH of 0.311.  We will add on free T3 and free T4.  Normal electrolytes.  She is not pregnant.  Normal hemoglobin.  Has mild leukocytosis which has improved compared to previous.  Coags normal.  CK normal.  ESR normal.  Urine shows hematuria which she states she is on her menstrual cycle.  MRI of her brain and orbits with and without contrast showed no acute abnormality.  Ophthalmology was concerned that this could be a demyelinating disorder or related to her Remicade infusion.  She is scheduled for another infusion in the morning.  Patient and husband are upset by their way time.  Has been begins to yell at this physician and is difficult to redirect, calm down.  He also has been yelling at the nursing staff.  Apologize for weight time and explained reassuring results here in the emergency department.  Discussed with patient and husband that we would be contacting neurology for their recommendations.  They are in agreement with this plan.  ED PROGRESS: Discussed with Dr. Leonel Ramsay with neurology.  Appreciate his help.  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  He does not think that the patient needs high-dose IV steroids and does not feel that the patient needs admission to the hospital.  I agree.  Unfortunately when I went back to tell the patient these results, patient and her husband had eloped from the emergency department.  I was unable to perform a complete eye examination.  I do not think this is pseudotumor cerebri but was not able to assess for papilledema.  She was not complaining of headache.  Nothing at this time to  suggest stroke, mass.  Doubt intracranial hemorrhage or meningitis.  Plan was to give patient neurology outpatient follow-up information and have her discuss holding future Remicade infusions with her rheumatologist.  Unfortunately was not able to discuss any of these plans with patient or husband as they had already left the emergency department.  I reviewed all nursing notes, vitals, pertinent previous records, EKGs, lab and urine results, imaging (as available).      Dallan Schonberg, Delice Bison, DO 10/19/17 [phone]

## 2017-10-20 LAB — ACETYLCHOLINE RECEPTOR, BINDING

## 2017-10-27 DIAGNOSIS — Z79899 Other long term (current) drug therapy: Secondary | ICD-10-CM | POA: Insufficient documentation

## 2017-11-08 ENCOUNTER — Ambulatory Visit (HOSPITAL_COMMUNITY): Payer: BLUE CROSS/BLUE SHIELD | Admitting: Psychiatry

## 2017-12-14 ENCOUNTER — Inpatient Hospital Stay (HOSPITAL_COMMUNITY)
Admission: AD | Admit: 2017-12-14 | Discharge: 2017-12-14 | Disposition: A | Discharge status: Home / Self Care | Payer: BLUE CROSS/BLUE SHIELD | Source: Ambulatory Visit | Attending: Obstetrics and Gynecology | Admitting: Obstetrics and Gynecology

## 2017-12-14 ENCOUNTER — Inpatient Hospital Stay (HOSPITAL_COMMUNITY): Payer: BLUE CROSS/BLUE SHIELD

## 2017-12-14 ENCOUNTER — Encounter (HOSPITAL_COMMUNITY): Payer: Self-pay

## 2017-12-14 DIAGNOSIS — O468X1 Other antepartum hemorrhage, first trimester: Secondary | ICD-10-CM | POA: Diagnosis not present

## 2017-12-14 DIAGNOSIS — Z3A08 8 weeks gestation of pregnancy: Secondary | ICD-10-CM

## 2017-12-14 DIAGNOSIS — O209 Hemorrhage in early pregnancy, unspecified: Secondary | ICD-10-CM | POA: Diagnosis not present

## 2017-12-14 DIAGNOSIS — O418X10 Other specified disorders of amniotic fluid and membranes, first trimester, not applicable or unspecified: Secondary | ICD-10-CM | POA: Diagnosis not present

## 2017-12-14 HISTORY — DX: Anemia, unspecified: D64.9

## 2017-12-14 HISTORY — DX: Gestational diabetes mellitus in pregnancy, unspecified control: O24.419

## 2017-12-14 LAB — URINALYSIS, ROUTINE W REFLEX MICROSCOPIC
Bilirubin Urine: NEGATIVE
GLUCOSE, UA: 50 mg/dL — AB
Ketones, ur: 5 mg/dL — AB
NITRITE: NEGATIVE
PH: 5 (ref 5.0–8.0)
PROTEIN: NEGATIVE mg/dL
SPECIFIC GRAVITY, URINE: 1.03 (ref 1.005–1.030)

## 2017-12-14 LAB — WET PREP, GENITAL
Clue Cells Wet Prep HPF POC: NONE SEEN
SPERM: NONE SEEN
Trich, Wet Prep: NONE SEEN
YEAST WET PREP: NONE SEEN

## 2017-12-14 LAB — CBC
HEMATOCRIT: 32.6 % — AB (ref 36.0–46.0)
Hemoglobin: 10.2 g/dL — ABNORMAL LOW (ref 12.0–15.0)
MCH: 22.9 pg — AB (ref 26.0–34.0)
MCHC: 31.3 g/dL (ref 30.0–36.0)
MCV: 73.1 fL — AB (ref 78.0–100.0)
Platelets: 270 10*3/uL (ref 150–400)
RBC: 4.46 MIL/uL (ref 3.87–5.11)
RDW: 20.5 % — ABNORMAL HIGH (ref 11.5–15.5)
WBC: 18.7 10*3/uL — ABNORMAL HIGH (ref 4.0–10.5)

## 2017-12-14 LAB — HCG, QUANTITATIVE, PREGNANCY: hCG, Beta Chain, Quant, S: 143716 m[IU]/mL — ABNORMAL HIGH (ref <5)

## 2017-12-14 LAB — POCT PREGNANCY, URINE: PREG TEST UR: POSITIVE — AB

## 2017-12-14 NOTE — MAU Note (Cosign Needed)
History     CSN: [id number]  Arrival date & time 12/14/17  1550   First Provider Initiated Contact with Patient 12/14/17 1632      Chief Complaint  Patient presents with  . Vaginal Bleeding  . Abdominal Pain    HPI  [name] is a [phone] at 8 weeks 5 days in MAU for single episode of bleeding today with a "nickel-sized clot in the toilet" at 1430 accompanied by a "small time of cramps, like a period".  Patient immediately donned a pad in her underwear and has not observed additional bleeding.   Past Medical History:  Diagnosis Date  . Anemia   . GERD (gastroesophageal reflux disease)   . Gestational diabetes   . Iritis   . PCOS (polycystic ovarian syndrome)   . RA (rheumatoid arthritis) ([place])     Past Surgical History:  Procedure Laterality Date  . abdominal plasty    . BREAST REDUCTION SURGERY    . DILATION AND CURETTAGE OF UTERUS    . DILATION AND EVACUATION N/A 08/09/2017   Procedure: DILATATION AND EVACUATION;  Surgeon: Allyn Kenner, DO;  Location: [facility];  Service: Gynecology;  Laterality: N/A;  . filopian tube removal      History reviewed. No pertinent family history.  Social History   Tobacco Use  . Smoking status: Never Smoker  . Smokeless tobacco: Never Used  Substance Use Topics  . Alcohol use: Not Currently  . Drug use: No    OB History    Gravida  7   Para  3   Term      Preterm      AB  2   Living  3     SAB      TAB  1   Ectopic  1   Multiple      Live Births              Review of Systems  Gastrointestinal: Negative for abdominal pain, nausea and vomiting.  Genitourinary: Negative for pelvic pain, vaginal bleeding and vaginal pain.    Allergies  Celebrex [celecoxib]  Home Medications    BP 131/89 (BP Location: Left Arm)   Pulse (!) 121   Temp 99 F (37.2 C) (Oral)   Resp 16   Ht 5\' 2"  (1.575 m)   Wt 190 lb (86.2 kg)   LMP 10/14/2017   SpO2 97%   BMI 34.75 kg/m   Physical Exam   Constitutional: She appears well-developed and well-nourished.  HENT:  Head: Normocephalic.  Pulmonary/Chest: Effort normal.  Abdominal: There is no rigidity, no rebound, no guarding and no CVA tenderness.  Genitourinary: Uterus is not enlarged and not tender. Cervix exhibits no motion tenderness and no discharge. Right adnexum displays no mass, no tenderness and no fullness. Left adnexum displays no mass, no tenderness and no fullness. No tenderness or bleeding in the vagina.  Skin: Skin is warm and dry.    MAU Course  Procedures (including critical care time)  Labs Reviewed  WET PREP, GENITAL - Abnormal; Notable for the following components:      Result Value   WBC, Wet Prep HPF POC FEW (*)    All other components within normal limits  URINALYSIS, ROUTINE W REFLEX MICROSCOPIC - Abnormal; Notable for the following components:   APPearance HAZY (*)    Glucose, UA 50 (*)    Hgb urine dipstick MODERATE (*)    Ketones, ur 5 (*)    Leukocytes,  UA TRACE (*)    Bacteria, UA RARE (*)    All other components within normal limits  CBC - Abnormal; Notable for the following components:   WBC 18.7 (*)    Hemoglobin 10.2 (*)    HCT 32.6 (*)    MCV 73.1 (*)    MCH 22.9 (*)    RDW 20.5 (*)    All other components within normal limits  POCT PREGNANCY, URINE - Abnormal; Notable for the following components:   Preg Test, Ur POSITIVE (*)    All other components within normal limits  HCG, QUANTITATIVE, PREGNANCY  GC/CHLAMYDIA PROBE AMP ([place]) NOT AT [facility]   US Ob Comp Less 14 Wks  Result Date: 12/14/2017 CLINICAL DATA:  Passing clots today. Quantitative beta HCG is pending. LMP 10/14/2016. By LMP patient is 8 weeks 5 days and has EDC of 07/21/2018. EXAM: OBSTETRIC <14 WK [place] AND TRANSVAGINAL OB US TECHNIQUE: Both transabdominal and transvaginal ultrasound examinations were performed for complete evaluation of the gestation as well as the maternal uterus, adnexal regions, and pelvic  cul-de-sac. Transvaginal technique was performed to assess early pregnancy. COMPARISON:  08/01/2017 FINDINGS: Intrauterine gestational sac: Single Yolk sac:  Not Visualized. Embryo:  Visualized. Cardiac Activity: Visualized. Heart Rate: 178 bpm CRL: 19.3 mm   8 w   3 d                  [place] EDC: 07/23/2018 Subchorionic hemorrhage: Small subchorionic hemorrhage is present, measuring 1.8 x 0.5 x 2.4 centimeters. Maternal uterus/adnexae: Ovaries are normal in appearance. No free pelvic fluid. IMPRESSION: 1. Single living intrauterine embryo. 2. Today's exam confirms clinical EDC of 07/21/2018 based on LMP. 3. Small subchorionic hemorrhage. 4. No adnexal mass. Electronically Signed   By: Nolon Nations M.D.   On: 12/14/2017 17:41     1. [redacted] weeks gestation of pregnancy   2. Vaginal bleeding in pregnancy, first trimester       MDM  B positive  Leukocytosis noted, however patient presents with existing diagnosis of rheumatoid arthritis and is on Prednisone regimen. No other signs of active infection. Subchorionic hemorrhage noted on [place] report. Patient counseled that she may experience additional episodes of bleeding and should return to the MAU should those occur. Information included in discharge instructions. Pt has initial OB appointment scheduled for Friday, May 24 and verbalized understanding she should keep appointment.

## 2017-12-14 NOTE — Discharge Instructions (Signed)
Subchorionic Hematoma °A subchorionic hematoma is a gathering of blood between the outer wall of the placenta and the inner wall of the womb (uterus). The placenta is the organ that connects the fetus to the wall of the uterus. The placenta performs the feeding, breathing (oxygen to the fetus), and waste removal (excretory work) of the fetus. °Subchorionic hematoma is the most common abnormality found on a result from ultrasonography done during the first trimester or early second trimester of pregnancy. If there has been little or no vaginal bleeding, early small hematomas usually shrink on their own and do not affect your baby or pregnancy. The blood is gradually absorbed over 1-2 weeks. When bleeding starts later in pregnancy or the hematoma is larger or occurs in an older pregnant woman, the outcome may not be as good. Larger hematomas may get bigger, which increases the chances for miscarriage. Subchorionic hematoma also increases the risk of premature detachment of the placenta from the uterus, preterm (premature) labor, and stillbirth. °Follow these instructions at home: °· Stay on bed rest if your health care provider recommends this. Although bed rest will not prevent more bleeding or prevent a miscarriage, your health care provider may recommend bed rest until you are advised otherwise. °· Avoid heavy lifting (more than 10 lb [4.5 kg]), exercise, sexual intercourse, or douching as directed by your health care provider. °· Keep track of the number of pads you use each day and how soaked (saturated) they are. Write down this information. °· Do not use tampons. °· Keep all follow-up appointments as directed by your health care provider. Your health care provider may ask you to have follow-up blood tests or ultrasound tests or both. °Get help right away if: °· You have severe cramps in your stomach, back, abdomen, or pelvis. °· You have a fever. °· You pass large clots or tissue. Save any tissue for your  health care provider to look at. °· Your bleeding increases or you become lightheaded, feel weak, or have fainting episodes. °This information is not intended to replace advice given to you by your health care provider. Make sure you discuss any questions you have with your health care provider. °Document Released: 11/02/2006 Document Revised: 12/24/2015 Document Reviewed: 02/14/2013 °Elsevier Interactive Patient Education © 2017 [employer] Inc[employer] ° °

## 2017-12-14 NOTE — MAU Note (Signed)
Pt was at Dr. Elvis Coil off last week with a positive pregnancy. Pt went to the bathroom today and had some bleeding and passed a clot. Having cramping 6/10

## 2017-12-15 ENCOUNTER — Other Ambulatory Visit: Payer: Self-pay | Admitting: Advanced Practice Midwife

## 2017-12-15 LAB — GC/CHLAMYDIA PROBE AMP (~~LOC~~) NOT AT ARMC
CHLAMYDIA, DNA PROBE: NEGATIVE
Neisseria Gonorrhea: NEGATIVE

## 2018-01-03 DIAGNOSIS — O09529 Supervision of elderly multigravida, unspecified trimester: Secondary | ICD-10-CM | POA: Insufficient documentation

## 2018-01-17 ENCOUNTER — Encounter (HOSPITAL_COMMUNITY): Payer: Self-pay | Admitting: Psychiatry

## 2018-01-17 ENCOUNTER — Ambulatory Visit (INDEPENDENT_AMBULATORY_CARE_PROVIDER_SITE_OTHER): Payer: BLUE CROSS/BLUE SHIELD | Admitting: Psychiatry

## 2018-01-17 VITALS — BP 133/88 | HR 106 | Ht 62.0 in | Wt 192.0 lb

## 2018-01-17 DIAGNOSIS — F4312 Post-traumatic stress disorder, chronic: Secondary | ICD-10-CM | POA: Diagnosis not present

## 2018-01-17 MED ORDER — TRAZODONE HCL 100 MG PO TABS: ORAL_TABLET | ORAL | 0 refills | Status: DC

## 2018-01-17 MED ORDER — ARIPIPRAZOLE 2 MG PO TABS: 2.0000 mg | ORAL_TABLET | Freq: Every day | ORAL | 0 refills | Status: DC

## 2018-01-17 MED ORDER — SERTRALINE HCL 100 MG PO TABS: 100.0000 mg | ORAL_TABLET | Freq: Every day | ORAL | 0 refills | Status: DC
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
## 2018-02-21 ENCOUNTER — Ambulatory Visit (INDEPENDENT_AMBULATORY_CARE_PROVIDER_SITE_OTHER): Payer: BLUE CROSS/BLUE SHIELD | Admitting: Psychiatry

## 2018-02-21 ENCOUNTER — Encounter (HOSPITAL_COMMUNITY): Payer: Self-pay | Admitting: Psychiatry

## 2018-02-21 VITALS — BP 112/80 | HR 80 | Ht 62.0 in | Wt 185.0 lb

## 2018-02-21 DIAGNOSIS — F332 Major depressive disorder, recurrent severe without psychotic features: Secondary | ICD-10-CM | POA: Diagnosis not present

## 2018-02-21 DIAGNOSIS — F4312 Post-traumatic stress disorder, chronic: Secondary | ICD-10-CM

## 2018-02-21 DIAGNOSIS — F411 Generalized anxiety disorder: Secondary | ICD-10-CM

## 2018-02-21 MED ORDER — ARIPIPRAZOLE 5 MG PO TABS: 5.0000 mg | ORAL_TABLET | Freq: Every day | ORAL | 0 refills | Status: DC

## 2018-02-21 MED ORDER — CITALOPRAM HYDROBROMIDE 20 MG PO TABS: ORAL_TABLET | ORAL | 0 refills | Status: DC

## 2018-02-21 NOTE — Progress Notes (Signed)
Anna Lucero  MRN:  [id number]  Chief Complaint: doing much better HPI: Anna Lucero reports that her mood has been much better and she is finally smiling, feels about 50% better.  She reports that the sexual side effects of Zoloft are pretty awful and she cannot climax.  We agreed to switch her to Celexa and titrate to 40 mg.  We also agreed to increase Abilify to 5 mg for a maintenance dose.  No acute safety issues and she does not feel as hopeless as she did at our last visit.  We will follow-up in 4 weeks or sooner if needed.  Visit Diagnosis:    ICD-10-CM   1. Chronic post-traumatic stress disorder (PTSD) F43.12 citalopram (CELEXA) 20 MG tablet    ARIPiprazole (ABILIFY) 5 MG tablet  2. GAD (generalized anxiety disorder) F41.1   3. 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 Reviewed, with no updates at this time.   Past Medical History:  Past Medical History:  Diagnosis Date  . Anemia   . GERD (gastroesophageal reflux disease)   . Gestational diabetes   . Iritis   . PCOS (polycystic ovarian syndrome)   . RA (rheumatoid arthritis) ([facility])     Past Surgical History:  Procedure Laterality Date  . abdominal plasty    . BREAST REDUCTION SURGERY    . DILATION AND CURETTAGE OF UTERUS    . DILATION AND EVACUATION N/A 08/09/2017   Procedure: DILATATION AND EVACUATION;  Surgeon: Allyn Kenner, DO;  Location: [facility];  Service: Gynecology;  Laterality: N/A;  . filopian tube removal      Family Psychiatric History: See intake H&P for full details. Reviewed, with no updates at this time.   Family History: History reviewed. No pertinent family history.  Social History:  Social History   Socioeconomic History  . Marital status: Married    Spouse name: Not on file  . Number of children: Not on file  . Years of  education: Not on file  . Highest education level: Not on file  Occupational History  . Not on file  Social Needs  . Financial resource strain: Not on file  . Food insecurity:    Worry: Not on file    Inability: Not on file  . Transportation needs:    Medical: Not on file    Non-medical: Not on file  Tobacco Use  . Smoking status: Never Smoker  . Smokeless tobacco: Never Used  Substance and Sexual Activity  . Alcohol use: Yes    Comment: 3x a week  . Drug use: No  . Sexual activity: Not on file  Lifestyle  . Physical activity:    Days per week: Not on file    Minutes per session: Not on file  . Stress: Not on file  Relationships  . Social connections:    Talks on phone: Not on file    Gets together: Not on file    Attends religious service: Not on file    Active member of club or organization: Not on file    Attends meetings of clubs or organizations: Not on file    Relationship status: Not on file  Other Topics Concern  . Not on file  Social History Narrative  . Not on file    Allergies:  Allergies  Allergen Reactions  .  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 Celebrex [Celecoxib] Other (See Comments)    Chest pain  . Spironolactone Other (See Comments)    Bleeding    Metabolic Disorder Labs: No results found for: HGBA1C, MPG No results found for: PROLACTIN No results found for: Luc[facility], TRIG, HDL, CHOLHDL, VLDL, LDLCALC Lab Results  Component Value Date   TSH 0.311 (L) 10/18/2017    Therapeutic Level Labs: No results found for: LITHIUM No results found for: VALPROATE No components found for:  CBMZ  Current Medications: Current Outpatient Medications  Medication Sig Dispense Refill  . Adalimumab (HUMIRA) 40 MG/0.4ML PSKT Humira(CF) 40 mg/0.4 mL subcutaneous syringe kit    . ARIPiprazole (ABILIFY) 5 MG tablet Take 1 tablet (5 mg total) by mouth daily. 90 tablet 0  . folic acid (FOLVITE) 1 MG tablet Take by mouth.    Marland Kitchen  omeprazole (PRILOSEC) 20 MG capsule Take 20 mg by mouth daily.     . predniSONE (DELTASONE) 5 MG tablet prednisone 5 mg tablet    . traZODone (DESYREL) 100 MG tablet Take 1-2 tablets at night for sleep 180 tablet 0  . Vitamin D, Ergocalciferol, (DRISDOL) 50000 units CAPS capsule Take 50,000 Units by mouth once a week. On Saturday  5  . albuterol (PROAIR HFA) 108 (90 Base) MCG/ACT inhaler ProAir HFA 90 mcg/actuation aerosol inhaler    . cephALEXin (KEFLEX) 500 MG capsule TK 1 C PO TID  0  . citalopram (CELEXA) 20 MG tablet Take 1 tablet (20 mg total) by mouth daily for 10 days, THEN 2 tablets (40 mg total) daily. 190 tablet 0  . meloxicam (MOBIC) 15 MG tablet TK 1 T PO D  1  . methotrexate (RHEUMATREX) 2.5 MG tablet TK 5 TS PO WEEKLY  3  . norelgestromin-ethinyl estradiol Marilu Favre) 150-35 MCG/24HR transdermal patch Xulane 150 mcg-35 mcg/24 hr transdermal patch  AS DIRECTED    . prednisoLONE acetate (PRED FORTE) 1 % ophthalmic suspension   1  . sitaGLIPtin (JANUVIA) 50 MG tablet Januvia 50 mg tablet     No current facility-administered medications for this visit.      Musculoskeletal: Strength & Muscle Tone: within normal limits Gait & Station: normal Patient leans: N/A  Psychiatric Specialty Exam: ROS  Blood pressure 112/80, pulse 80, height 5' 2"  (1.575 m), weight 185 lb (83.9 kg), last menstrual period 10/14/2017, unknown if currently breastfeeding.Body mass index is 33.84 kg/m.  General Appearance: Casual and Fairly Groomed  Eye Contact:  Fair  Speech:  Clear and Coherent and Normal Rate  Volume:  Normal  Mood:  Euthymic  Affect:  Appropriate and Congruent  Thought Process:  Goal Directed and Descriptions of Associations: Intact  Orientation:  Full (Time, Place, and Person)  Thought Content: Logical   Suicidal Thoughts:  No  Homicidal Thoughts:  No  Memory:  Immediate;   Good  Judgement:  Fair  Insight:  Fair  Psychomotor Activity:  Normal  Concentration:  Concentration:  Good  Recall:  Good  Fund of Knowledge: Good  Language: Good  Akathisia:  Negative  Handed:  Right  AIMS (if indicated): not done  Assets:  Communication Skills Desire for Improvement Financial Resources/Insurance Housing  ADL's:  Intact  Cognition: WNL  Sleep:  Fair   Screenings:   Assessment and Plan:  Anna Lucero presents with overall gradually improving mood on the Abilify, denies any significant intolerance.  She has sexual intolerance side effects with the  Zoloft so we agreed to switch to Celexa.  She denies any acute safety issues and agrees to follow-up in 4 weeks.  1. Chronic post-traumatic stress disorder (PTSD)   2. GAD (generalized anxiety disorder)   3. Severe episode of recurrent major depressive disorder, without psychotic features ([facility])     Status of current problems: gradually improving  Labs Ordered: No orders of the defined types were placed in this encounter.   Labs Reviewed: NA  Collateral Obtained/Records Reviewed: NA  Plan:  Increase Abilify to 5 mg Discontinue Zoloft in favor of Celexa Celexa 20 mg daily, increase to 40 mg in 2 weeks rtc 4 weeks  Aundra Dubin, MD 02/21/2018, 3:30 PM

## 2018-02-22 ENCOUNTER — Ambulatory Visit (INDEPENDENT_AMBULATORY_CARE_PROVIDER_SITE_OTHER): Payer: BLUE CROSS/BLUE SHIELD | Admitting: Licensed Clinical Social Worker

## 2018-02-22 ENCOUNTER — Encounter (HOSPITAL_COMMUNITY): Payer: Self-pay | Admitting: Licensed Clinical Social Worker

## 2018-02-22 DIAGNOSIS — F4312 Post-traumatic stress disorder, chronic: Secondary | ICD-10-CM

## 2018-02-22 DIAGNOSIS — F332 Major depressive disorder, recurrent severe without psychotic features: Secondary | ICD-10-CM | POA: Diagnosis not present

## 2018-02-22 DIAGNOSIS — F411 Generalized anxiety disorder: Secondary | ICD-10-CM
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
## 2018-03-11 ENCOUNTER — Inpatient Hospital Stay (HOSPITAL_COMMUNITY)
Admission: AD | Admit: 2018-03-11 | Discharge: 2018-03-11 | Disposition: A | Discharge status: Home / Self Care | Payer: BLUE CROSS/BLUE SHIELD | Source: Ambulatory Visit | Attending: Obstetrics and Gynecology | Admitting: Obstetrics and Gynecology

## 2018-03-11 ENCOUNTER — Encounter (HOSPITAL_COMMUNITY): Payer: Self-pay | Admitting: *Deleted

## 2018-03-11 ENCOUNTER — Other Ambulatory Visit: Payer: Self-pay

## 2018-03-11 ENCOUNTER — Inpatient Hospital Stay (HOSPITAL_COMMUNITY): Payer: BLUE CROSS/BLUE SHIELD

## 2018-03-11 DIAGNOSIS — N939 Abnormal uterine and vaginal bleeding, unspecified: Secondary | ICD-10-CM | POA: Diagnosis not present

## 2018-03-11 DIAGNOSIS — M069 Rheumatoid arthritis, unspecified: Secondary | ICD-10-CM | POA: Diagnosis not present

## 2018-03-11 DIAGNOSIS — K219 Gastro-esophageal reflux disease without esophagitis: Secondary | ICD-10-CM | POA: Diagnosis not present

## 2018-03-11 DIAGNOSIS — Z79899 Other long term (current) drug therapy: Secondary | ICD-10-CM | POA: Insufficient documentation

## 2018-03-11 DIAGNOSIS — Z888 Allergy status to other drugs, medicaments and biological substances status: Secondary | ICD-10-CM | POA: Diagnosis not present

## 2018-03-11 DIAGNOSIS — Z9889 Other specified postprocedural states: Secondary | ICD-10-CM | POA: Insufficient documentation

## 2018-03-11 DIAGNOSIS — Z886 Allergy status to analgesic agent status: Secondary | ICD-10-CM | POA: Insufficient documentation

## 2018-03-11 DIAGNOSIS — Z7952 Long term (current) use of systemic steroids: Secondary | ICD-10-CM | POA: Insufficient documentation

## 2018-03-11 LAB — URINALYSIS, ROUTINE W REFLEX MICROSCOPIC
Bilirubin Urine: NEGATIVE
GLUCOSE, UA: NEGATIVE mg/dL
Ketones, ur: NEGATIVE mg/dL
Nitrite: NEGATIVE
PROTEIN: 30 mg/dL — AB
Specific Gravity, Urine: 1.028 (ref 1.005–1.030)
pH: 6 (ref 5.0–8.0)

## 2018-03-11 LAB — CBC WITH DIFFERENTIAL/PLATELET
Basophils Absolute: 0 10*3/uL (ref 0.0–0.1)
Basophils Relative: 0 %
EOS ABS: 0.1 10*3/uL (ref 0.0–0.7)
Eosinophils Relative: 1 %
HCT: 28.3 % — ABNORMAL LOW (ref 36.0–46.0)
Hemoglobin: 8.6 g/dL — ABNORMAL LOW (ref 12.0–15.0)
LYMPHS PCT: 14 %
Lymphs Abs: 1.4 10*3/uL (ref 0.7–4.0)
MCH: 22.3 pg — ABNORMAL LOW (ref 26.0–34.0)
MCHC: 30.4 g/dL (ref 30.0–36.0)
MCV: 73.3 fL — ABNORMAL LOW (ref 78.0–100.0)
MONO ABS: 0.2 10*3/uL (ref 0.1–1.0)
Monocytes Relative: 2 %
NEUTROS PCT: 83 %
Neutro Abs: 8.5 10*3/uL — ABNORMAL HIGH (ref 1.7–7.7)
Other: 0 %
PLATELETS: 301 10*3/uL (ref 150–400)
RBC: 3.86 MIL/uL — AB (ref 3.87–5.11)
RDW: 18.4 % — AB (ref 11.5–15.5)
WBC: 10.2 10*3/uL (ref 4.0–10.5)

## 2018-03-11 LAB — HCG, QUANTITATIVE, PREGNANCY: HCG, BETA CHAIN, QUANT, S: 5 m[IU]/mL — AB (ref <5)

## 2018-03-11 LAB — POCT PREGNANCY, URINE: PREG TEST UR: NEGATIVE

## 2018-03-11 MED ORDER — NORGESTIMATE-ETH ESTRADIOL 0.25-35 MG-MCG PO TABS: 1.0000 | ORAL_TABLET | Freq: Every day | ORAL | 0 refills | Status: DC

## 2018-03-11 NOTE — Discharge Instructions (Signed)

## 2018-03-11 NOTE — MAU Provider Note (Addendum)
History     CSN: [id number]  Arrival date and time: 03/11/18 1532   First Provider Initiated Contact with Patient 03/11/18 1746      Chief Complaint  Patient presents with  . Vaginal Bleeding   SUSAN BLEICH is a 42 y.o. [phone] who presents today with vaginal bleeding. She had a surgical TAB at the beginning of June at approx [redacted] weeks EGA. She reports that since that time she has had bleeding every day. She was given the patch for contraception, and she stopped using that approx 2 weeks ago.   Vaginal Bleeding  The patient's primary symptoms include pelvic pain and vaginal bleeding. This is a new problem. The current episode started more than 1 month ago. The problem occurs intermittently. The problem has been unchanged. The pain is mild ("minor period cramps"). The problem affects both sides. She is not pregnant. Pertinent negatives include no chills, dysuria, fever, frequency, nausea, urgency or vomiting. The vaginal bleeding is typical of menses. She has been passing clots. She has not been passing tissue. Nothing aggravates the symptoms. She has tried nothing for the symptoms. She uses nothing for contraception. Her past medical history is significant for a terminated pregnancy.    OB History    Gravida  7   Para  3   Term      Preterm      AB  2   Living  3     SAB      TAB  1   Ectopic  1   Multiple      Live Births              Past Medical History:  Diagnosis Date  . Anemia   . GERD (gastroesophageal reflux disease)   . Gestational diabetes   . Iritis   . PCOS (polycystic ovarian syndrome)   . RA (rheumatoid arthritis) ([place])     Past Surgical History:  Procedure Laterality Date  . abdominal plasty    . BREAST REDUCTION SURGERY    . DILATION AND CURETTAGE OF UTERUS    . DILATION AND EVACUATION N/A 08/09/2017   Procedure: DILATATION AND EVACUATION;  Surgeon: Allyn Kenner, DO;  Location: [facility];  Service: Gynecology;  Laterality: N/A;  .  filopian tube removal      History reviewed. No pertinent family history.  Social History   Tobacco Use  . Smoking status: Never Smoker  . Smokeless tobacco: Never Used  Substance Use Topics  . Alcohol use: Yes    Comment: 3x a week  . Drug use: No    Allergies:  Allergies  Allergen Reactions  . 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 Celebrex [Celecoxib] Other (See Comments)    Chest pain  . Spironolactone Other (See Comments)    Bleeding    Medications Prior to Admission  Medication Sig Dispense Refill Last Dose  . Adalimumab (HUMIRA) 40 MG/0.4ML PSKT Humira(CF) 40 mg/0.4 mL subcutaneous syringe kit     . albuterol (PROAIR HFA) 108 (90 Base) MCG/ACT inhaler ProAir HFA 90 mcg/actuation aerosol inhaler     . ARIPiprazole (ABILIFY) 5 MG tablet Take 1 tablet (5 mg total) by mouth daily. 90 tablet 0   . cephALEXin (KEFLEX) 500 MG capsule TK 1 C PO TID  0   . citalopram (CELEXA) 20 MG tablet Take 1 tablet (20 mg total) by mouth daily for 10 days, THEN 2 tablets (40 mg total)  daily. 841 tablet 0   . folic acid (FOLVITE) 1 MG tablet Take by mouth.     . meloxicam (MOBIC) 15 MG tablet TK 1 T PO D  1   . methotrexate (RHEUMATREX) 2.5 MG tablet TK 5 TS PO WEEKLY  3   . norelgestromin-ethinyl estradiol Marilu Favre) 150-35 MCG/24HR transdermal patch Xulane 150 mcg-35 mcg/24 hr transdermal patch  AS DIRECTED     . omeprazole (PRILOSEC) 20 MG capsule Take 20 mg by mouth daily.    Taking  . prednisoLONE acetate (PRED FORTE) 1 % ophthalmic suspension   1   . predniSONE (DELTASONE) 5 MG tablet prednisone 5 mg tablet     . sitaGLIPtin (JANUVIA) 50 MG tablet Januvia 50 mg tablet     . traZODone (DESYREL) 100 MG tablet Take 1-2 tablets at night for sleep 180 tablet 0 Taking  . Vitamin D, Ergocalciferol, (DRISDOL) 50000 units CAPS capsule Take 50,000 Units by mouth once a week. On Saturday  5 Taking    Review of Systems  Constitutional: Negative for chills and  fever.  Gastrointestinal: Negative for nausea and vomiting.  Genitourinary: Positive for pelvic pain and vaginal bleeding. Negative for dysuria, frequency and urgency.   Physical Exam   Blood pressure 122/74, pulse (!) 105, temperature 99.1 F (37.3 C), temperature source Oral, resp. rate 20, weight 85.4 kg, last menstrual period 10/14/2017, SpO2 100 %, unknown if currently breastfeeding.  Physical Exam  Nursing note and vitals reviewed. Constitutional: She is oriented to person, place, and time. She appears well-developed and well-nourished. No distress.  HENT:  Head: Normocephalic.  Cardiovascular: Normal rate.  Respiratory: Effort normal.  GI: Soft. There is no tenderness. There is no rebound.  Neurological: She is alert and oriented to person, place, and time.  Skin: Skin is warm and dry.  Psychiatric: She has a normal mood and affect.   Results for orders placed or performed during the hospital encounter of 03/11/18 (from the past 24 hour(s))  Urinalysis, Routine w reflex microscopic     Status: Abnormal   Collection Time: 03/11/18  4:16 PM  Result Value Ref Range   Color, Urine YELLOW YELLOW   APPearance HAZY (A) CLEAR   Specific Gravity, Urine 1.028 1.005 - 1.030   pH 6.0 5.0 - 8.0   Glucose, UA NEGATIVE NEGATIVE mg/dL   Hgb urine dipstick MODERATE (A) NEGATIVE   Bilirubin Urine NEGATIVE NEGATIVE   Ketones, ur NEGATIVE NEGATIVE mg/dL   Protein, ur 30 (A) NEGATIVE mg/dL   Nitrite NEGATIVE NEGATIVE   Leukocytes, UA TRACE (A) NEGATIVE   RBC / HPF 0-5 0 - 5 RBC/hpf   WBC, UA 0-5 0 - 5 WBC/hpf   Bacteria, UA RARE (A) NONE SEEN   Squamous Epithelial / LPF 6-10 0 - 5   Mucus PRESENT   Pregnancy, urine POC     Status: None   Collection Time: 03/11/18  4:22 PM  Result Value Ref Range   Preg Test, Ur NEGATIVE NEGATIVE  CBC with Differential/Platelet     Status: Abnormal   Collection Time: 03/11/18  6:27 PM  Result Value Ref Range   WBC 10.2 4.0 - 10.5 K/uL   RBC 3.86 (L)  3.87 - 5.11 MIL/uL   Hemoglobin 8.6 (L) 12.0 - 15.0 g/dL   HCT 28.3 (L) 36.0 - 46.0 %   MCV 73.3 (L) 78.0 - 100.0 fL   MCH 22.3 (L) 26.0 - 34.0 pg   MCHC 30.4 30.0 - 36.0 g/dL  RDW 18.4 (H) 11.5 - 15.5 %   Platelets 301 150 - 400 K/uL   Neutrophils Relative % 83 %   Lymphocytes Relative 14 %   Monocytes Relative 2 %   Eosinophils Relative 1 %   Basophils Relative 0 %   Other 0 %   Neutro Abs 8.5 (H) 1.7 - 7.7 K/uL   Lymphs Abs 1.4 0.7 - 4.0 K/uL   Monocytes Absolute 0.2 0.1 - 1.0 K/uL   Eosinophils Absolute 0.1 0.0 - 0.7 K/uL   Basophils Absolute 0.0 0.0 - 0.1 K/uL   RBC Morphology Schistocytes present   hCG, quantitative, pregnancy     Status: Abnormal   Collection Time: 03/11/18  6:27 PM  Result Value Ref Range   hCG, Beta Chain, Quant, S 5 (H) <5 mIU/mL   US Pelvis Transvanginal Non-ob (tv Only)  Result Date: 03/11/2018 CLINICAL DATA:  Abnormal uterine bleeding. EXAM: TRANSVAGINAL ULTRASOUND OF PELVIS DOPPLER ULTRASOUND OF OVARIES TECHNIQUE: Transvaginal ultrasound examination of the pelvis was performed including evaluation of the uterus, ovaries, adnexal regions, and pelvic cul-de-sac. Color and duplex Doppler ultrasound was utilized to evaluate blood flow to the ovaries. COMPARISON:  None. FINDINGS: Uterus Measurements: 8.9 x 5.9 x 6.5 cm. No fibroids or other mass visualized. Endometrium Thickness: 9 mm. The endometrial cavity is distended by nonvascular debris and hypoechoic fluid. Right ovary Measurements: 3.4 x 2.4 x 2.8 cm. Normal appearance/no adnexal mass. Left ovary Measurements: 5.0 x 2.4 x 2.5 cm.  Corpus luteum noted. Pulsed Doppler evaluation demonstrates normal low-resistance arterial and venous waveforms in both ovaries. IMPRESSION: 1. No evidence for ovarian torsion or mass. 2. Normal thickness endometrium measuring 9 mm. Fluid and debris distension of the endometrial cavity is identified. Etiology indeterminate and may represent blood products. If bleeding remains  unresponsive to hormonal or medical therapy, sonohysterogram should be considered for focal lesion work-up. (Ref: Radiological Reasoning: Algorithmic Workup of Abnormal Vaginal Bleeding with Endovaginal Sonography and Sonohysterography. AJR 2008; 828:M03-49) Electronically Signed   By: Kerby Moors M.D.   On: 03/11/2018 20:32   US Pelvic Doppler (torsion R/o Or Mass Arterial Flow)  Result Date: 03/11/2018 CLINICAL DATA:  Abnormal uterine bleeding. EXAM: TRANSVAGINAL ULTRASOUND OF PELVIS DOPPLER ULTRASOUND OF OVARIES TECHNIQUE: Transvaginal ultrasound examination of the pelvis was performed including evaluation of the uterus, ovaries, adnexal regions, and pelvic cul-de-sac. Color and duplex Doppler ultrasound was utilized to evaluate blood flow to the ovaries. COMPARISON:  None. FINDINGS: Uterus Measurements: 8.9 x 5.9 x 6.5 cm. No fibroids or other mass visualized. Endometrium Thickness: 9 mm. The endometrial cavity is distended by nonvascular debris and hypoechoic fluid. Right ovary Measurements: 3.4 x 2.4 x 2.8 cm. Normal appearance/no adnexal mass. Left ovary Measurements: 5.0 x 2.4 x 2.5 cm.  Corpus luteum noted. Pulsed Doppler evaluation demonstrates normal low-resistance arterial and venous waveforms in both ovaries. IMPRESSION: 1. No evidence for ovarian torsion or mass. 2. Normal thickness endometrium measuring 9 mm. Fluid and debris distension of the endometrial cavity is identified. Etiology indeterminate and may represent blood products. If bleeding remains unresponsive to hormonal or medical therapy, sonohysterogram should be considered for focal lesion work-up. (Ref: Radiological Reasoning: Algorithmic Workup of Abnormal Vaginal Bleeding with Endovaginal Sonography and Sonohysterography. AJR 2008; 179:X50-56) Electronically Signed   By: Kerby Moors M.D.   On: 03/11/2018 20:32     MAU Course  Procedures  MDM  Care turned over to Jorje Guild, NP [place] results pending   Marcille Buffy 8:03 PM 03/11/18  Pelvic exam performed -- small amount of old blood, no active bleeding. Cervix pink & smooth. No CMT.   Reviewed pt with Dr. Philis Pique, including lab & ultrasound results. Ok to discharge home. Per discussion with patient, Dr. Elvis Coil plan for her was placement of IUD. Encouraged patient to schedule that appointment. In the mean time will give pack of OCPs, pt to double dose x 3 days.  Assessment and Plan  A: 1. Abnormal uterine bleeding (AUB)   2. Vaginal bleeding    P: Discharge home in stable condition Rx sprintec Schedule f/u with Dr. Theressa Millard, Junie Panning, NP

## 2018-03-11 NOTE — MAU Note (Signed)
Pt presents with c/o VB.  Reports she had a therapeutic abortion 2 months ago and has been bleeding ever since.  Reports passed large clots 2 weeks ago, not currently

## 2018-03-22 ENCOUNTER — Encounter (HOSPITAL_COMMUNITY): Payer: Self-pay | Admitting: Psychiatry

## 2018-03-22 ENCOUNTER — Ambulatory Visit (INDEPENDENT_AMBULATORY_CARE_PROVIDER_SITE_OTHER): Payer: BLUE CROSS/BLUE SHIELD | Admitting: Psychiatry

## 2018-03-22 DIAGNOSIS — F4312 Post-traumatic stress disorder, chronic: Secondary | ICD-10-CM | POA: Diagnosis not present

## 2018-03-22 DIAGNOSIS — Z79899 Other long term (current) drug therapy: Secondary | ICD-10-CM | POA: Diagnosis not present

## 2018-03-22 MED ORDER — ZOLPIDEM TARTRATE 5 MG PO TABS: 5.0000 mg | ORAL_TABLET | Freq: Every evening | ORAL | 1 refills | Status: DC | PRN

## 2018-03-22 MED ORDER — CITALOPRAM HYDROBROMIDE 40 MG PO TABS: 40.0000 mg | ORAL_TABLET | Freq: Every day | ORAL | 0 refills | Status: DC

## 2018-03-22 NOTE — Progress Notes (Signed)
BH MD/PA/NP OP Progress Note  03/22/2018 3:43 PM Anna Lucero  MRN:  [id number]  Chief Complaint: doing ok  HPI: Anna Lucero reports a flare in anxiety with switching her schedule to day shifts for her job.  Turns out she has yet to increase her Celexa to 40 mg so I instructed her to go ahead and make this change.  We also agreed for a low-dose of Ambien 5 mg nightly to augment her sleep given that she is having some difficulty switching to nighttime sleep schedule.  She is otherwise reports that she is doing okay and wishes to continue the current medication regimen with the changes as above.  We will follow-up in 4-6 weeks or sooner if needed.  Visit Diagnosis:    ICD-10-CM   1. Chronic post-traumatic stress disorder (PTSD) F43.12 zolpidem (AMBIEN) 5 MG tablet    citalopram (CELEXA) 40 MG tablet    DISCONTINUED: citalopram (CELEXA) 40 MG tablet    Past Psychiatric History: See intake H&P for full details. Reviewed, with no updates at this time.   Past Medical History:  Past Medical History:  Diagnosis Date  . Anemia   . GERD (gastroesophageal reflux disease)   . Gestational diabetes   . Iritis   . PCOS (polycystic ovarian syndrome)   . RA (rheumatoid arthritis) ([place])     Past Surgical History:  Procedure Laterality Date  . abdominal plasty    . BREAST REDUCTION SURGERY    . DILATION AND CURETTAGE OF UTERUS    . DILATION AND EVACUATION N/A 08/09/2017   Procedure: DILATATION AND EVACUATION;  Surgeon: Allyn Kenner, DO;  Location: [facility];  Service: Gynecology;  Laterality: N/A;  . filopian tube removal      Family Psychiatric History: See intake H&P for full details. Reviewed, with no updates at this time.   Family History: History reviewed. No pertinent family history.  Social History:  Social History   Socioeconomic History  . Marital status: Married    Spouse name: Not on file  . Number of children: Not on file  . Years of education: Not on file  . Highest  education level: Not on file  Occupational History  . Not on file  Social Needs  . Financial resource strain: Not on file  . Food insecurity:    Worry: Not on file    Inability: Not on file  . Transportation needs:    Medical: Not on file    Non-medical: Not on file  Tobacco Use  . Smoking status: Never Smoker  . Smokeless tobacco: Never Used  Substance and Sexual Activity  . Alcohol use: Yes    Comment: 3x a week  . Drug use: No  . Sexual activity: Not on file  Lifestyle  . Physical activity:    Days per week: Not on file    Minutes per session: Not on file  . Stress: Not on file  Relationships  . Social connections:    Talks on phone: Not on file    Gets together: Not on file    Attends religious service: Not on file    Active member of club or organization: Not on file    Attends meetings of clubs or organizations: Not on file    Relationship status: Not on file  Other Topics Concern  . Not on file  Social History Narrative  . Not on file    Allergies:  Allergies  Allergen Reactions  . Lamictal [Lamotrigine]  Skin rash, likely not SJS but concerning  . Celebrex [Celecoxib] Other (See Comments)    Chest pain  . Spironolactone Other (See Comments)    Bleeding    Metabolic Disorder Labs: No results found for: HGBA1C, MPG No results found for: PROLACTIN No results found for: Lucero, TRIG, HDL, CHOLHDL, VLDL, LDLCALC Lab Results  Component Value Date   TSH 0.311 (L) 10/18/2017    Therapeutic Level Labs: No results found for: LITHIUM No results found for: VALPROATE No components found for:  CBMZ  Current Medications: Current Outpatient Medications  Medication Sig Dispense Refill  . Adalimumab (HUMIRA) 40 MG/0.4ML PSKT Humira(CF) 40 mg/0.4 mL subcutaneous syringe kit    . albuterol (PROAIR HFA) 108 (90 Base) MCG/ACT inhaler ProAir HFA 90 mcg/actuation aerosol inhaler    . ARIPiprazole (ABILIFY) 5 MG tablet Take 1 tablet (5 mg total) by mouth daily. 90  tablet 0  . citalopram (CELEXA) 40 MG tablet Take 1 tablet (40 mg total) by mouth daily. 90 tablet 0  . folic acid (FOLVITE) 1 MG tablet Take by mouth.    . methotrexate (RHEUMATREX) 2.5 MG tablet TK 5 TS PO WEEKLY  3  . norgestimate-ethinyl estradiol (ORTHO-CYCLEN,SPRINTEC,PREVIFEM) 0.25-35 MG-MCG tablet Take 1 tablet by mouth daily. 1 Package 0  . omeprazole (PRILOSEC) 20 MG capsule Take 20 mg by mouth daily.     . prednisoLONE acetate (PRED FORTE) 1 % ophthalmic suspension   1  . predniSONE (DELTASONE) 5 MG tablet prednisone 5 mg tablet    . traZODone (DESYREL) 100 MG tablet Take 1-2 tablets at night for sleep 180 tablet 0  . Vitamin D, Ergocalciferol, (DRISDOL) 50000 units CAPS capsule Take 50,000 Units by mouth once a week. On Saturday  5  . cephALEXin (KEFLEX) 500 MG capsule TK 1 C PO TID  0  . meloxicam (MOBIC) 15 MG tablet TK 1 T PO D  1  . sitaGLIPtin (JANUVIA) 50 MG tablet Januvia 50 mg tablet    . zolpidem (AMBIEN) 5 MG tablet Take 1 tablet (5 mg total) by mouth at bedtime as needed for sleep. 30 tablet 1   No current facility-administered medications for this visit.      Musculoskeletal: Strength & Muscle Tone: within normal limits Gait & Station: normal Patient leans: N/A  Psychiatric Specialty Exam: ROS  Blood pressure 130/82, pulse 80, height 5' 2"  (1.575 m), weight 189 lb (85.7 kg), unknown if currently breastfeeding.Body mass index is 34.57 kg/m.  General Appearance: Casual and Fairly Groomed  Eye Contact:  Fair  Speech:  Clear and Coherent and Normal Rate  Volume:  Normal  Mood:  Euthymic  Affect:  Appropriate and Congruent  Thought Process:  Goal Directed and Descriptions of Associations: Intact  Orientation:  Full (Time, Place, and Person)  Thought Content: Logical   Suicidal Thoughts:  No  Homicidal Thoughts:  No  Memory:  Immediate;   Good  Judgement:  Fair  Insight:  Fair  Psychomotor Activity:  Normal  Concentration:  Concentration: Good  Recall:   Good  Fund of Knowledge: Good  Language: Good  Akathisia:  Negative  Handed:  Right  AIMS (if indicated): not done  Assets:  Communication Skills Desire for Improvement Financial Resources/Insurance Housing  ADL's:  Intact  Cognition: WNL  Sleep:  Fair   Screenings:   Assessment and Plan:  Anna Lucero presents with some flare and anxiety in the context of job shift changes.  She is trying to get used to the  daytime work schedule.  We agreed to maintain Abilify at 5 mg and increase Celexa to 40 mg which she was supposed to have already done.  Will augment with Ambien nightly to help get her sleep cycle back on track.  No acute safety issues.  1. Chronic post-traumatic stress disorder (PTSD)     Status of current problems: gradually improving  Labs Ordered: No orders of the defined types were placed in this encounter.   Labs Reviewed: NA  Collateral Obtained/Records Reviewed: NA  Plan:  Abilify 5 mg Celexa 40 mg  Ambien 5 mg qhs prn Trazodone 200 mg qhs rtc 4-6 weeks  Aundra Dubin, MD 03/22/2018, 3:43 PM

## 2018-04-02 ENCOUNTER — Ambulatory Visit (HOSPITAL_COMMUNITY): Payer: Self-pay | Admitting: Licensed Clinical Social Worker

## 2018-04-09 DIAGNOSIS — H30033 Focal chorioretinal inflammation, peripheral, bilateral: Secondary | ICD-10-CM | POA: Insufficient documentation

## 2018-04-09 DIAGNOSIS — H3581 Retinal edema: Secondary | ICD-10-CM | POA: Insufficient documentation

## 2018-04-09 DIAGNOSIS — H2513 Age-related nuclear cataract, bilateral: Secondary | ICD-10-CM | POA: Insufficient documentation

## 2018-04-10 ENCOUNTER — Ambulatory Visit (INDEPENDENT_AMBULATORY_CARE_PROVIDER_SITE_OTHER): Payer: BLUE CROSS/BLUE SHIELD | Admitting: Licensed Clinical Social Worker

## 2018-04-10 DIAGNOSIS — F332 Major depressive disorder, recurrent severe without psychotic features: Secondary | ICD-10-CM

## 2018-04-10 DIAGNOSIS — F4312 Post-traumatic stress disorder, chronic: Secondary | ICD-10-CM

## 2018-04-11 ENCOUNTER — Encounter (HOSPITAL_COMMUNITY): Payer: Self-pay | Admitting: Licensed Clinical Social Worker

## 2018-04-11 NOTE — Progress Notes (Signed)
   THERAPIST PROGRESS NOTE  Session Time: 4-5  Participation Level: Active  Behavioral Response: Neat and Well GroomedAlertEuthymic  Type of Therapy: Individual Therapy  Treatment Goals addressed: Coping Skills for tolerating anxiety and stress management.  Interventions: CBT  Summary: Anna Lucero is a 42 y.o. female who presents with hx of PTSD, GAD, and MDD.   Subjective: "It's been hard at work recently. I almost quit on the spot because I can't tolerate the way I'm being treated and the way they're treating our patients."  Pt was engaged, talkative, w/ blunted affect, and irritable mood in session. 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 She reports on the mistreatment at work and her difficulty tolerating other's harsh criticisms of her. Pt denies any supportive people in her office. Counselor spent time validating pt's concerns and discussing pt's differences in values vs her work. Though pt appeared frustrated by her work she also expressed resolve to focus on "what she can change, herself and how she takes care of her family."  Suicidal/Homicidal: Nowithout intent/plan  Therapist Response: Counselor used open questions, active listening, and supportive encouragement. Counselor worked to help pt gain insight into her negative assumptions, poor communication skills, and inability to tolerate any stress w/o become upset at others. Pt appears highly resilient. She exhibits some mild codependent traits but she has managed to maintain a strong sense of self identity. Counselor and pt reviewed treatment plan and created person-centered goals for ongoing therapy.  Plan: Return again in 4 weeks.  Diagnosis:    ICD-10-CM   1. Chronic post-traumatic stress disorder (PTSD) F43.12   2. Severe episode of recurrent major depressive disorder, without psychotic features [facility]) F33.2       Archie Balboa,  LCAS-A 04/11/2018

## 2018-05-10 ENCOUNTER — Ambulatory Visit (HOSPITAL_COMMUNITY): Payer: Self-pay | Admitting: Licensed Clinical Social Worker

## 2018-05-11 ENCOUNTER — Other Ambulatory Visit: Payer: Self-pay | Admitting: Physician Assistant

## 2018-05-11 DIAGNOSIS — Z1231 Encounter for screening mammogram for malignant neoplasm of breast: Secondary | ICD-10-CM

## 2018-05-25 ENCOUNTER — Ambulatory Visit
Admission: RE | Admit: 2018-05-25 | Discharge: 2018-05-25 | Disposition: A | Discharge status: Home / Self Care | Payer: BLUE CROSS/BLUE SHIELD | Source: Ambulatory Visit

## 2018-05-25 DIAGNOSIS — Z1231 Encounter for screening mammogram for malignant neoplasm of breast: Secondary | ICD-10-CM

## 2018-06-04 ENCOUNTER — Other Ambulatory Visit (HOSPITAL_COMMUNITY): Payer: Self-pay

## 2018-06-04 DIAGNOSIS — F4312 Post-traumatic stress disorder, chronic: Secondary | ICD-10-CM

## 2018-06-04 MED ORDER — ZOLPIDEM TARTRATE 5 MG PO TABS: 5.0000 mg | ORAL_TABLET | Freq: Every evening | ORAL | 0 refills | Status: DC | PRN

## 2018-06-04 MED ORDER — ARIPIPRAZOLE 5 MG PO TABS: 5.0000 mg | ORAL_TABLET | Freq: Every day | ORAL | 0 refills | Status: DC

## 2018-06-27 DIAGNOSIS — D500 Iron deficiency anemia secondary to blood loss (chronic): Secondary | ICD-10-CM | POA: Insufficient documentation

## 2018-07-02 DIAGNOSIS — F4310 Post-traumatic stress disorder, unspecified: Secondary | ICD-10-CM | POA: Insufficient documentation

## 2018-07-04 ENCOUNTER — Other Ambulatory Visit (HOSPITAL_COMMUNITY): Payer: Self-pay

## 2018-07-04 DIAGNOSIS — E6609 Other obesity due to excess calories: Secondary | ICD-10-CM | POA: Insufficient documentation

## 2018-07-04 DIAGNOSIS — F4312 Post-traumatic stress disorder, chronic: Secondary | ICD-10-CM

## 2018-07-04 MED ORDER — ARIPIPRAZOLE 5 MG PO TABS: 5.0000 mg | ORAL_TABLET | Freq: Every day | ORAL | 0 refills | Status: DC

## 2018-07-13 ENCOUNTER — Ambulatory Visit (INDEPENDENT_AMBULATORY_CARE_PROVIDER_SITE_OTHER): Payer: BLUE CROSS/BLUE SHIELD | Admitting: Psychiatry

## 2018-07-13 ENCOUNTER — Encounter (HOSPITAL_COMMUNITY): Payer: Self-pay | Admitting: Psychiatry

## 2018-07-13 VITALS — BP 130/76 | HR 80 | Ht 62.0 in | Wt 195.0 lb

## 2018-07-13 DIAGNOSIS — F330 Major depressive disorder, recurrent, mild: Secondary | ICD-10-CM | POA: Diagnosis not present

## 2018-07-13 DIAGNOSIS — F4312 Post-traumatic stress disorder, chronic: Secondary | ICD-10-CM

## 2018-07-13 MED ORDER — ZOLPIDEM TARTRATE ER 6.25 MG PO TBCR: 6.2500 mg | EXTENDED_RELEASE_TABLET | Freq: Every evening | ORAL | 1 refills | Status: DC | PRN

## 2018-07-13 MED ORDER — DULOXETINE HCL 20 MG PO CPEP: ORAL_CAPSULE | ORAL | 1 refills | Status: DC

## 2018-07-13 MED ORDER — ARIPIPRAZOLE 5 MG PO TABS: 5.0000 mg | ORAL_TABLET | Freq: Every day | ORAL | 1 refills | Status: DC
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
## 2018-08-03 ENCOUNTER — Other Ambulatory Visit: Payer: Self-pay

## 2018-08-03 ENCOUNTER — Other Ambulatory Visit: Payer: Self-pay | Admitting: Physician Assistant

## 2018-08-03 DIAGNOSIS — N6310 Unspecified lump in the right breast, unspecified quadrant: Secondary | ICD-10-CM

## 2018-08-06 ENCOUNTER — Other Ambulatory Visit: Payer: Self-pay | Admitting: Physician Assistant

## 2018-08-06 ENCOUNTER — Ambulatory Visit
Admission: RE | Admit: 2018-08-06 | Discharge: 2018-08-06 | Disposition: A | Discharge status: Home / Self Care | Payer: BLUE CROSS/BLUE SHIELD | Source: Ambulatory Visit | Attending: Physician Assistant | Admitting: Physician Assistant

## 2018-08-06 DIAGNOSIS — N6310 Unspecified lump in the right breast, unspecified quadrant: Secondary | ICD-10-CM
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
## 2018-08-25 ENCOUNTER — Encounter (HOSPITAL_COMMUNITY): Payer: Self-pay | Admitting: Psychiatry

## 2018-08-25 ENCOUNTER — Ambulatory Visit (INDEPENDENT_AMBULATORY_CARE_PROVIDER_SITE_OTHER): Payer: BLUE CROSS/BLUE SHIELD | Admitting: Psychiatry

## 2018-08-25 ENCOUNTER — Other Ambulatory Visit: Payer: Self-pay

## 2018-08-25 ENCOUNTER — Ambulatory Visit (HOSPITAL_COMMUNITY): Payer: Self-pay | Admitting: Psychiatry

## 2018-08-25 DIAGNOSIS — F4312 Post-traumatic stress disorder, chronic: Secondary | ICD-10-CM

## 2018-08-25 MED ORDER — DOXEPIN HCL 25 MG PO CAPS: 25.0000 mg | ORAL_CAPSULE | Freq: Every evening | ORAL | 1 refills | Status: DC | PRN

## 2018-08-25 MED ORDER — DULOXETINE HCL 20 MG PO CPEP: 40.0000 mg | ORAL_CAPSULE | Freq: Every day | ORAL | 1 refills | Status: DC

## 2018-08-25 NOTE — Patient Instructions (Signed)
1. Increase duloxetine 40 mg daily  2. Start doxepin 25 mg at night as needed for sleep 3. Return to clinic in two months

## 2018-09-05 ENCOUNTER — Encounter: Payer: Self-pay | Admitting: Obstetrics and Gynecology

## 2018-09-05 ENCOUNTER — Telehealth: Payer: Self-pay | Admitting: Obstetrics and Gynecology

## 2018-09-05 NOTE — Telephone Encounter (Signed)
Called and left a message for patient to call back to schedule a new patient doctor referral appointment with our office to see Dr. Talbert Nan for: encounter for contraceptive management.

## 2018-09-12 ENCOUNTER — Ambulatory Visit: Payer: BLUE CROSS/BLUE SHIELD | Admitting: Obstetrics and Gynecology

## 2018-10-25 ENCOUNTER — Other Ambulatory Visit: Payer: Self-pay

## 2018-10-25 ENCOUNTER — Ambulatory Visit (INDEPENDENT_AMBULATORY_CARE_PROVIDER_SITE_OTHER): Payer: BLUE CROSS/BLUE SHIELD | Admitting: Psychiatry

## 2018-10-25 DIAGNOSIS — F411 Generalized anxiety disorder: Secondary | ICD-10-CM | POA: Diagnosis not present

## 2018-10-25 DIAGNOSIS — F4312 Post-traumatic stress disorder, chronic: Secondary | ICD-10-CM

## 2018-10-25 DIAGNOSIS — F330 Major depressive disorder, recurrent, mild: Secondary | ICD-10-CM | POA: Diagnosis not present

## 2018-10-25 MED ORDER — ARIPIPRAZOLE 5 MG PO TABS: 5.0000 mg | ORAL_TABLET | Freq: Every day | ORAL | 0 refills | Status: DC

## 2018-10-25 MED ORDER — CITALOPRAM HYDROBROMIDE 40 MG PO TABS: 40.0000 mg | ORAL_TABLET | Freq: Every day | ORAL | 0 refills | Status: DC

## 2018-10-25 MED ORDER — DOXEPIN HCL 25 MG PO CAPS: 25.0000 mg | ORAL_CAPSULE | Freq: Every evening | ORAL | 0 refills | Status: DC | PRN
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
## 2018-11-06 ENCOUNTER — Ambulatory Visit (HOSPITAL_COMMUNITY): Payer: BLUE CROSS/BLUE SHIELD | Admitting: Psychiatry

## 2018-11-06 ENCOUNTER — Other Ambulatory Visit: Payer: Self-pay

## 2018-12-30 ENCOUNTER — Inpatient Hospital Stay (HOSPITAL_COMMUNITY)
Admission: EM | Admit: 2018-12-30 | Discharge: 2019-01-02 | DRG: 854 | Disposition: A | Discharge status: Home / Self Care | Payer: BLUE CROSS/BLUE SHIELD | Attending: Internal Medicine | Admitting: Internal Medicine

## 2018-12-30 ENCOUNTER — Other Ambulatory Visit: Payer: Self-pay

## 2018-12-30 ENCOUNTER — Emergency Department (HOSPITAL_COMMUNITY): Payer: BLUE CROSS/BLUE SHIELD

## 2018-12-30 ENCOUNTER — Encounter (HOSPITAL_COMMUNITY): Payer: Self-pay | Admitting: Emergency Medicine

## 2018-12-30 DIAGNOSIS — F419 Anxiety disorder, unspecified: Secondary | ICD-10-CM | POA: Diagnosis present

## 2018-12-30 DIAGNOSIS — Z813 Family history of other psychoactive substance abuse and dependence: Secondary | ICD-10-CM

## 2018-12-30 DIAGNOSIS — M059 Rheumatoid arthritis with rheumatoid factor, unspecified: Secondary | ICD-10-CM | POA: Diagnosis present

## 2018-12-30 DIAGNOSIS — E669 Obesity, unspecified: Secondary | ICD-10-CM | POA: Diagnosis present

## 2018-12-30 DIAGNOSIS — Z6839 Body mass index (BMI) 39.0-39.9, adult: Secondary | ICD-10-CM

## 2018-12-30 DIAGNOSIS — Z20828 Contact with and (suspected) exposure to other viral communicable diseases: Secondary | ICD-10-CM | POA: Diagnosis present

## 2018-12-30 DIAGNOSIS — R509 Fever, unspecified: Secondary | ICD-10-CM | POA: Diagnosis present

## 2018-12-30 DIAGNOSIS — Z888 Allergy status to other drugs, medicaments and biological substances status: Secondary | ICD-10-CM | POA: Diagnosis not present

## 2018-12-30 DIAGNOSIS — K2100 Gastro-esophageal reflux disease with esophagitis, without bleeding: Secondary | ICD-10-CM | POA: Diagnosis present

## 2018-12-30 DIAGNOSIS — Z8632 Personal history of gestational diabetes: Secondary | ICD-10-CM | POA: Diagnosis not present

## 2018-12-30 DIAGNOSIS — E282 Polycystic ovarian syndrome: Secondary | ICD-10-CM | POA: Diagnosis present

## 2018-12-30 DIAGNOSIS — Z811 Family history of alcohol abuse and dependence: Secondary | ICD-10-CM | POA: Diagnosis not present

## 2018-12-30 DIAGNOSIS — M05771 Rheumatoid arthritis with rheumatoid factor of right ankle and foot without organ or systems involvement: Secondary | ICD-10-CM | POA: Diagnosis not present

## 2018-12-30 DIAGNOSIS — J449 Chronic obstructive pulmonary disease, unspecified: Secondary | ICD-10-CM | POA: Diagnosis present

## 2018-12-30 DIAGNOSIS — D509 Iron deficiency anemia, unspecified: Secondary | ICD-10-CM | POA: Diagnosis present

## 2018-12-30 DIAGNOSIS — Z791 Long term (current) use of non-steroidal anti-inflammatories (NSAID): Secondary | ICD-10-CM

## 2018-12-30 DIAGNOSIS — F329 Major depressive disorder, single episode, unspecified: Secondary | ICD-10-CM | POA: Diagnosis present

## 2018-12-30 DIAGNOSIS — Z818 Family history of other mental and behavioral disorders: Secondary | ICD-10-CM | POA: Diagnosis not present

## 2018-12-30 DIAGNOSIS — R590 Localized enlarged lymph nodes: Secondary | ICD-10-CM | POA: Diagnosis present

## 2018-12-30 DIAGNOSIS — R000 Tachycardia, unspecified: Secondary | ICD-10-CM | POA: Diagnosis present

## 2018-12-30 DIAGNOSIS — R6510 Systemic inflammatory response syndrome (SIRS) of non-infectious origin without acute organ dysfunction: Secondary | ICD-10-CM | POA: Diagnosis present

## 2018-12-30 DIAGNOSIS — I10 Essential (primary) hypertension: Secondary | ICD-10-CM | POA: Diagnosis present

## 2018-12-30 DIAGNOSIS — A419 Sepsis, unspecified organism: Secondary | ICD-10-CM | POA: Diagnosis present

## 2018-12-30 DIAGNOSIS — F4310 Post-traumatic stress disorder, unspecified: Secondary | ICD-10-CM | POA: Diagnosis present

## 2018-12-30 DIAGNOSIS — D500 Iron deficiency anemia secondary to blood loss (chronic): Secondary | ICD-10-CM | POA: Diagnosis present

## 2018-12-30 DIAGNOSIS — J9811 Atelectasis: Secondary | ICD-10-CM | POA: Diagnosis present

## 2018-12-30 DIAGNOSIS — D869 Sarcoidosis, unspecified: Secondary | ICD-10-CM | POA: Diagnosis present

## 2018-12-30 HISTORY — DX: Bronchitis, not specified as acute or chronic: J40

## 2018-12-30 LAB — CBC WITH DIFFERENTIAL/PLATELET
Abs Immature Granulocytes: 0.07 10*3/uL (ref 0.00–0.07)
Basophils Absolute: 0 10*3/uL (ref 0.0–0.1)
Basophils Relative: 0 %
Eosinophils Absolute: 0 10*3/uL (ref 0.0–0.5)
Eosinophils Relative: 0 %
HCT: 41.7 % (ref 36.0–46.0)
Hemoglobin: 13.5 g/dL (ref 12.0–15.0)
Immature Granulocytes: 1 %
Lymphocytes Relative: 7 %
Lymphs Abs: 0.9 10*3/uL (ref 0.7–4.0)
MCH: 29.1 pg (ref 26.0–34.0)
MCHC: 32.4 g/dL (ref 30.0–36.0)
MCV: 89.9 fL (ref 80.0–100.0)
Monocytes Absolute: 0.7 10*3/uL (ref 0.1–1.0)
Monocytes Relative: 6 %
Neutro Abs: 10.3 10*3/uL — ABNORMAL HIGH (ref 1.7–7.7)
Neutrophils Relative %: 86 %
Platelets: 344 10*3/uL (ref 150–400)
RBC: 4.64 MIL/uL (ref 3.87–5.11)
RDW: 13.5 % (ref 11.5–15.5)
WBC: 12 10*3/uL — ABNORMAL HIGH (ref 4.0–10.5)
nRBC: 0 % (ref 0.0–0.2)

## 2018-12-30 LAB — URINALYSIS, ROUTINE W REFLEX MICROSCOPIC
Bilirubin Urine: NEGATIVE
Glucose, UA: NEGATIVE mg/dL
Hgb urine dipstick: NEGATIVE
Ketones, ur: NEGATIVE mg/dL
Leukocytes,Ua: NEGATIVE
Nitrite: NEGATIVE
Protein, ur: NEGATIVE mg/dL
Specific Gravity, Urine: >1.046 — ABNORMAL HIGH (ref 1.005–1.030)
pH: 5 (ref 5.0–8.0)

## 2018-12-30 LAB — COMPREHENSIVE METABOLIC PANEL
ALT: 29 U/L (ref 0–44)
AST: 32 U/L (ref 15–41)
Albumin: 3.4 g/dL — ABNORMAL LOW (ref 3.5–5.0)
Alkaline Phosphatase: 86 U/L (ref 38–126)
Anion gap: 10 (ref 5–15)
BUN: 14 mg/dL (ref 6–20)
CO2: 22 mmol/L (ref 22–32)
Calcium: 9.1 mg/dL (ref 8.9–10.3)
Chloride: 108 mmol/L (ref 98–111)
Creatinine, Ser: 0.81 mg/dL (ref 0.44–1.00)
GFR calc Af Amer: >60 mL/min (ref >60)
GFR calc non Af Amer: >60 mL/min (ref >60)
Glucose, Bld: 107 mg/dL — ABNORMAL HIGH (ref 70–99)
Potassium: 3.3 mmol/L — ABNORMAL LOW (ref 3.5–5.1)
Sodium: 140 mmol/L (ref 135–145)
Total Bilirubin: 0.3 mg/dL (ref 0.3–1.2)
Total Protein: 7.9 g/dL (ref 6.5–8.1)

## 2018-12-30 LAB — LACTIC ACID, PLASMA
Lactic Acid, Venous: 2.3 mmol/L (ref 0.5–1.9)
Lactic Acid, Venous: 2.4 mmol/L (ref 0.5–1.9)
Lactic Acid, Venous: 1.7 mmol/L (ref 0.5–1.9)

## 2018-12-30 LAB — I-STAT BETA HCG BLOOD, ED (MC, WL, AP ONLY): I-stat hCG, quantitative: <5 m[IU]/mL (ref <5)

## 2018-12-30 LAB — BRAIN NATRIURETIC PEPTIDE: B Natriuretic Peptide: 84.7 pg/mL (ref 0.0–100.0)

## 2018-12-30 LAB — GLUCOSE, CAPILLARY: Glucose-Capillary: 132 mg/dL — ABNORMAL HIGH (ref 70–99)

## 2018-12-30 LAB — MRSA PCR SCREENING: MRSA by PCR: NEGATIVE

## 2018-12-30 LAB — TROPONIN I: Troponin I: <0.03 ng/mL (ref <0.03)

## 2018-12-30 LAB — PROCALCITONIN: Procalcitonin: <0.1 ng/mL

## 2018-12-30 LAB — SARS CORONAVIRUS 2 BY RT PCR (HOSPITAL ORDER, PERFORMED IN ~~LOC~~ HOSPITAL LAB): SARS Coronavirus 2: NEGATIVE

## 2018-12-30 MED ORDER — CITALOPRAM HYDROBROMIDE 20 MG PO TABS
40.0000 mg | ORAL_TABLET | Freq: Every day | ORAL | Status: DC
  Administered 2018-12-30 – 2019-01-02 (×4): 40 mg via ORAL
  Filled 2018-12-30 (×6): qty 2

## 2018-12-30 MED ORDER — ACETAMINOPHEN 500 MG PO TABS
1000.0000 mg | ORAL_TABLET | Freq: Once | ORAL | Status: AC
  Administered 2018-12-30: 1000 mg via ORAL
  Filled 2018-12-30: qty 2

## 2018-12-30 MED ORDER — SODIUM CHLORIDE 0.9 % IV BOLUS (SEPSIS)
1000.0000 mL | Freq: Once | INTRAVENOUS | Status: AC
  Administered 2018-12-30: 04:00:00 1000 mL via INTRAVENOUS

## 2018-12-30 MED ORDER — IOHEXOL 350 MG/ML SOLN
100.0000 mL | Freq: Once | INTRAVENOUS | Status: AC | PRN
  Administered 2018-12-30: 06:00:00 100 mL via INTRAVENOUS

## 2018-12-30 MED ORDER — HYDROCODONE-HOMATROPINE 5-1.5 MG/5ML PO SYRP
5.0000 mL | ORAL_SOLUTION | ORAL | Status: DC | PRN
  Administered 2018-12-30 – 2019-01-02 (×5): 5 mL via ORAL
  Filled 2018-12-30 (×5): qty 5

## 2018-12-30 MED ORDER — SODIUM CHLORIDE 0.9 % IV SOLN
2.0000 g | Freq: Three times a day (TID) | INTRAVENOUS | Status: DC
  Filled 2018-12-30 (×2): qty 2

## 2018-12-30 MED ORDER — ACETAMINOPHEN 650 MG RE SUPP: 650.0000 mg | Freq: Four times a day (QID) | RECTAL | Status: DC | PRN

## 2018-12-30 MED ORDER — AMLODIPINE BESYLATE 10 MG PO TABS
10.0000 mg | ORAL_TABLET | Freq: Every day | ORAL | Status: DC
  Administered 2018-12-30 – 2019-01-02 (×4): 10 mg via ORAL
  Filled 2018-12-30 (×4): qty 1

## 2018-12-30 MED ORDER — SODIUM CHLORIDE (PF) 0.9 % IJ SOLN
INTRAMUSCULAR | Status: AC
  Administered 2018-12-30: 06:00:00
  Filled 2018-12-30: qty 50

## 2018-12-30 MED ORDER — ALBUTEROL SULFATE (2.5 MG/3ML) 0.083% IN NEBU: 2.5000 mg | INHALATION_SOLUTION | Freq: Four times a day (QID) | RESPIRATORY_TRACT | Status: DC | PRN

## 2018-12-30 MED ORDER — SODIUM CHLORIDE 0.9 % IV SOLN
2.0000 g | Freq: Three times a day (TID) | INTRAVENOUS | Status: DC
  Administered 2018-12-30 – 2019-01-02 (×9): 2 g via INTRAVENOUS
  Filled 2018-12-30 (×11): qty 2

## 2018-12-30 MED ORDER — ARIPIPRAZOLE 5 MG PO TABS
5.0000 mg | ORAL_TABLET | Freq: Every day | ORAL | Status: DC
  Administered 2018-12-30 – 2019-01-02 (×4): 5 mg via ORAL
  Filled 2018-12-30 (×5): qty 1

## 2018-12-30 MED ORDER — VANCOMYCIN HCL 10 G IV SOLR
2000.0000 mg | Freq: Once | INTRAVENOUS | Status: AC
  Administered 2018-12-30: 06:00:00 2000 mg via INTRAVENOUS
  Filled 2018-12-30: qty 2000

## 2018-12-30 MED ORDER — SODIUM CHLORIDE 0.9 % IV SOLN
Freq: Once | INTRAVENOUS | Status: AC
  Administered 2018-12-30: 12:00:00 via INTRAVENOUS

## 2018-12-30 MED ORDER — HYDRALAZINE HCL 20 MG/ML IJ SOLN
5.0000 mg | Freq: Four times a day (QID) | INTRAMUSCULAR | Status: DC | PRN
  Administered 2018-12-30 – 2018-12-31 (×3): 5 mg via INTRAVENOUS
  Filled 2018-12-30 (×3): qty 1

## 2018-12-30 MED ORDER — ENOXAPARIN SODIUM 40 MG/0.4ML ~~LOC~~ SOLN
40.0000 mg | SUBCUTANEOUS | Status: DC
  Administered 2018-12-30: 13:00:00 40 mg via SUBCUTANEOUS
  Filled 2018-12-30: qty 0.4

## 2018-12-30 MED ORDER — VANCOMYCIN HCL IN DEXTROSE 750-5 MG/150ML-% IV SOLN
750.0000 mg | Freq: Two times a day (BID) | INTRAVENOUS | Status: DC
  Administered 2018-12-30 – 2019-01-02 (×6): 750 mg via INTRAVENOUS
  Filled 2018-12-30 (×6): qty 150

## 2018-12-30 MED ORDER — SODIUM CHLORIDE 0.9 % IV SOLN
500.0000 mg | Freq: Once | INTRAVENOUS | Status: AC
  Administered 2018-12-30: 04:00:00 500 mg via INTRAVENOUS
  Filled 2018-12-30: qty 500

## 2018-12-30 MED ORDER — ONDANSETRON HCL 4 MG/2ML IJ SOLN
4.0000 mg | Freq: Four times a day (QID) | INTRAMUSCULAR | Status: DC | PRN
  Administered 2018-12-31 – 2019-01-02 (×3): 4 mg via INTRAVENOUS
  Filled 2018-12-30 (×3): qty 2

## 2018-12-30 MED ORDER — ONDANSETRON HCL 4 MG PO TABS: 4.0000 mg | ORAL_TABLET | Freq: Four times a day (QID) | ORAL | Status: DC | PRN

## 2018-12-30 MED ORDER — SODIUM CHLORIDE 0.9% FLUSH
3.0000 mL | Freq: Once | INTRAVENOUS | Status: AC
  Administered 2018-12-30: 3 mL via INTRAVENOUS

## 2018-12-30 MED ORDER — SODIUM CHLORIDE 0.9 % IV BOLUS (SEPSIS)
600.0000 mL | Freq: Once | INTRAVENOUS | Status: AC
  Administered 2018-12-30: 600 mL via INTRAVENOUS

## 2018-12-30 MED ORDER — HYDROCODONE-ACETAMINOPHEN 5-325 MG PO TABS
1.0000 | ORAL_TABLET | ORAL | Status: DC | PRN
  Administered 2018-12-30 – 2019-01-02 (×4): 1 via ORAL
  Filled 2018-12-30 (×4): qty 1

## 2018-12-30 MED ORDER — PANTOPRAZOLE SODIUM 40 MG PO TBEC
40.0000 mg | DELAYED_RELEASE_TABLET | Freq: Every day | ORAL | Status: DC
  Administered 2018-12-30 – 2019-01-02 (×4): 40 mg via ORAL
  Filled 2018-12-30 (×4): qty 1

## 2018-12-30 MED ORDER — POLYETHYLENE GLYCOL 3350 17 G PO PACK: 17.0000 g | PACK | Freq: Every day | ORAL | Status: DC | PRN

## 2018-12-30 MED ORDER — SODIUM CHLORIDE 0.9 % IV SOLN
2.0000 g | INTRAVENOUS | Status: AC
  Administered 2018-12-30: 04:00:00 2 g via INTRAVENOUS
  Filled 2018-12-30: qty 2

## 2018-12-30 MED ORDER — DOXEPIN HCL 25 MG PO CAPS
25.0000 mg | ORAL_CAPSULE | Freq: Every evening | ORAL | Status: DC | PRN
  Administered 2018-12-30: 22:00:00 25 mg via ORAL
  Filled 2018-12-30 (×2): qty 1

## 2018-12-30 MED ORDER — ACETAMINOPHEN 325 MG PO TABS
650.0000 mg | ORAL_TABLET | Freq: Four times a day (QID) | ORAL | Status: DC | PRN
  Administered 2018-12-31 – 2019-01-01 (×5): 650 mg via ORAL
  Filled 2018-12-30 (×6): qty 2

## 2018-12-30 NOTE — ED Notes (Signed)
Pt. Made aware for the need of urine specimen. 

## 2018-12-30 NOTE — ED Triage Notes (Signed)
Arrives with C/C SOB, cough x2-3 months and tremors. Started prednisone yesterday. COVID-19 tested last week and it was negative. Hx of pneumonia and bronchitis. 100 % RA.

## 2018-12-30 NOTE — ED Notes (Signed)
EKG given to EDP,Wickline,MD. For review. 

## 2018-12-30 NOTE — ED Notes (Signed)
Attempt to call report. Lauren RN on floor verbalizes "needs 20 minutes."

## 2018-12-30 NOTE — ED Notes (Signed)
ED TO INPATIENT HANDOFF REPORT  Name/Age/Gender Anna Lucero 43 y.o. female  Code Status   Home/SNF/Other Home  Chief Complaint Shortness of Breathe  Level of Care/Admitting Diagnosis ED Disposition    ED Disposition Condition [place]: [place] [100102]  Level of Care: Stepdown [14]  Admit to SDU based on following criteria: Respiratory Distress:  Frequent assessment and/or intervention to maintain adequate ventilation/respiration, pulmonary toilet, and respiratory treatment.  Covid Evaluation: Confirmed COVID Negative  Diagnosis: SIRS (systemic inflammatory response syndrome) [facility]) [[id number]]  Admitting Physician: Mariel Aloe [phone]  Attending Physician: Mariel Aloe [phone]  Estimated length of stay: past midnight tomorrow  Certification:: I certify this patient will need inpatient services for at least 2 midnights  PT Class (Do Not Modify): Inpatient [101]  PT Acc Code (Do Not Modify): Private [1]       Medical History Past Medical History:  Diagnosis Date  . Anemia   . Anxiety   . Bronchitis   . Depression   . GERD (gastroesophageal reflux disease)   . Gestational diabetes   . Iritis   . PCOS (polycystic ovarian syndrome)   . RA (rheumatoid arthritis) (HCC)     Allergies Allergies  Allergen Reactions  . 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 Celebrex [Celecoxib] Other (See Comments)    Chest pain  . Spironolactone Other (See Comments)    Bleeding    IV Location/Drains/Wounds Patient Lines/Drains/Airways Status   Active Line/Drains/Airways    Name:   Placement date:   Placement time:   Site:   Days:   Peripheral IV 12/30/18 Right Antecubital   12/30/18    0250    Antecubital   less than 1   Peripheral IV 12/30/18   12/30/18    1046    -   less than 1   Incision (Closed) 08/09/17 Other (Comment) Other (Comment)   08/09/17    1107     508           Labs/Imaging Results for orders placed or performed during the hospital encounter of 12/30/18 (from the past 48 hour(s))  Lactic acid, plasma     Status: Abnormal   Collection Time: 12/30/18  2:49 AM  Result Value Ref Range   Lactic Acid, Venous 2.4 (HH) 0.5 - 1.9 mmol/L    Comment: CRITICAL RESULT CALLED TO, READ BACK BY AND VERIFIED WITH: S,HODGES AT 0409 ON 12/30/18 BY A,MOHAMED Performed at [facility], [place] [address] [place], [place] [address]   Lactic acid, plasma     Status: Abnormal   Collection Time: 12/30/18  2:49 AM  Result Value Ref Range   Lactic Acid, Venous 2.3 (HH) 0.5 - 1.9 mmol/L    Comment: CRITICAL RESULT CALLED TO, READ BACK BY AND VERIFIED WITH: [facility] AT 0417 ON 12/30/18 BY A,MOHAMED Performed at [facility], [place] [address] [place], [place] [address]   Comprehensive metabolic panel     Status: Abnormal   Collection Time: 12/30/18  2:49 AM  Result Value Ref Range   Sodium 140 135 - 145 mmol/L   Potassium 3.3 (L) 3.5 - 5.1 mmol/L   Chloride 108 98 - 111 mmol/L   CO2 22 22 - 32 mmol/L   Glucose, Bld 107 (H) 70 - 99 mg/dL   BUN 14 6 - 20 mg/dL   Creatinine, Ser 0.81 0.44 - 1.00 mg/dL  Calcium 9.1 8.9 - 10.3 mg/dL   Total Protein 7.9 6.5 - 8.1 g/dL   Albumin 3.4 (L) 3.5 - 5.0 g/dL   AST 32 15 - 41 U/L   ALT 29 0 - 44 U/L   Alkaline Phosphatase 86 38 - 126 U/L   Total Bilirubin 0.3 0.3 - 1.2 mg/dL   GFR calc non Af Amer >60 >60 mL/min   GFR calc Af Amer >60 >60 mL/min   Anion gap 10 5 - 15    Comment: Performed at [facility], [place] [address], [place], [place] [address]  CBC with Differential     Status: Abnormal   Collection Time: 12/30/18  2:49 AM  Result Value Ref Range   WBC 12.0 (H) 4.0 - 10.5 K/uL   RBC 4.64 3.87 - 5.11 MIL/uL   Hemoglobin 13.5 12.0 - 15.0 g/dL   HCT 41.7 36.0 - 46.0 %   MCV 89.9 80.0 - 100.0 fL   MCH 29.1 26.0 - 34.0 pg   MCHC 32.4 30.0 - 36.0 g/dL   RDW 13.5  11.5 - 15.5 %   Platelets 344 150 - 400 K/uL   nRBC 0.0 0.0 - 0.2 %   Neutrophils Relative % 86 %   Neutro Abs 10.3 (H) 1.7 - 7.7 K/uL   Lymphocytes Relative 7 %   Lymphs Abs 0.9 0.7 - 4.0 K/uL   Monocytes Relative 6 %   Monocytes Absolute 0.7 0.1 - 1.0 K/uL   Eosinophils Relative 0 %   Eosinophils Absolute 0.0 0.0 - 0.5 K/uL   Basophils Relative 0 %   Basophils Absolute 0.0 0.0 - 0.1 K/uL   Immature Granulocytes 1 %   Abs Immature Granulocytes 0.07 0.00 - 0.07 K/uL    Comment: Performed at [facility], [place] [address], [place], [place] [address]  Urinalysis, Routine w reflex microscopic     Status: Abnormal   Collection Time: 12/30/18  2:49 AM  Result Value Ref Range   Color, Urine YELLOW YELLOW   APPearance CLEAR CLEAR   Specific Gravity, Urine >1.046 (H) 1.005 - 1.030   pH 5.0 5.0 - 8.0   Glucose, UA NEGATIVE NEGATIVE mg/dL   Hgb urine dipstick NEGATIVE NEGATIVE   Bilirubin Urine NEGATIVE NEGATIVE   Ketones, ur NEGATIVE NEGATIVE mg/dL   Protein, ur NEGATIVE NEGATIVE mg/dL   Nitrite NEGATIVE NEGATIVE   Leukocytes,Ua NEGATIVE NEGATIVE    Comment: Performed at [place] [address] [place], [place] [address]  Brain natriuretic peptide     Status: None   Collection Time: 12/30/18  3:01 AM  Result Value Ref Range   B Natriuretic Peptide 84.7 0.0 - 100.0 pg/mL    Comment: Performed at [facility], [place] [address], [place], [place] [address]  Troponin I - ONCE - STAT     Status: None   Collection Time: 12/30/18  3:02 AM  Result Value Ref Range   Troponin I <0.03 <0.03 ng/mL    Comment: Performed at [facility], [place] [address] [place], [place] [address]  SARS Coronavirus 2 (CEPHEID- Performed in [place] hospital lab), Hosp Order     Status: None   Collection Time: 12/30/18  3:03 AM  Result Value Ref Range   SARS Coronavirus 2 NEGATIVE NEGATIVE    Comment: (NOTE) If result is  NEGATIVE SARS-CoV-2 target nucleic acids are NOT DETECTED. The SARS-CoV-2 RNA is generally detectable in upper and lower  respiratory specimens  during the acute phase of infection. The lowest  concentration of SARS-CoV-2 viral copies this assay can detect is 250  copies / mL. A negative result does not preclude SARS-CoV-2 infection  and should not be used as the sole basis for treatment or other  patient management decisions.  A negative result may occur with  improper specimen collection / handling, submission of specimen other  than nasopharyngeal swab, presence of viral mutation(s) within the  areas targeted by this assay, and inadequate number of viral copies  (<250 copies / mL). A negative result must be combined with clinical  observations, patient history, and epidemiological information. If result is POSITIVE SARS-CoV-2 target nucleic acids are DETECTED. The SARS-CoV-2 RNA is generally detectable in upper and lower  respiratory specimens dur ing the acute phase of infection.  Positive  results are indicative of active infection with SARS-CoV-2.  Clinical  correlation with patient history and other diagnostic information is  necessary to determine patient infection status.  Positive results do  not rule out bacterial infection or co-infection with other viruses. If result is PRESUMPTIVE POSTIVE SARS-CoV-2 nucleic acids MAY BE PRESENT.   A presumptive positive result was obtained on the submitted specimen  and confirmed on repeat testing.  While 2019 novel coronavirus  (SARS-CoV-2) nucleic acids may be present in the submitted sample  additional confirmatory testing may be necessary for epidemiological  and / or clinical management purposes  to differentiate between  SARS-CoV-2 and other Sarbecovirus currently known to infect humans.  If clinically indicated additional testing with an alternate test  methodology [phone]) is advised. The SARS-CoV-2 RNA is generally  detectable  in upper and lower respiratory sp ecimens during the acute  phase of infection. The expected result is Negative. Fact Sheet for Patients:  StrictlyIdeas.no Fact Sheet for Healthcare Providers: BankingDealers.co.za This test is not yet approved or cleared by the [place] FDA and has been authorized for detection and/or diagnosis of SARS-CoV-2 by FDA under an Emergency Use Authorization (EUA).  This EUA will remain in effect (meaning this test can be used) for the duration of the COVID-19 declaration under Section 564(b)(1) of the Act, 21 U.S.C. section 360bbb-3(b)(1), unless the authorization is terminated or revoked sooner. Performed at [facility], [place] [address], [place], [place] [address]   I-Stat beta hCG blood, ED     Status: None   Collection Time: 12/30/18  3:24 AM  Result Value Ref Range   I-stat hCG, quantitative <5.0 <5 mIU/mL   Comment 3            Comment:   GEST. AGE      CONC.  (mIU/mL)   <=1 WEEK        5 - 50     2 WEEKS       50 - 500     3 WEEKS       100 - 10,000     4 WEEKS     1,000 - 30,000        FEMALE AND NON-PREGNANT FEMALE:     LESS THAN 5 mIU/mL   Procalcitonin - Baseline     Status: None   Collection Time: 12/30/18  8:32 AM  Result Value Ref Range   Procalcitonin <0.10 ng/mL    Comment:        Interpretation: PCT (Procalcitonin) <= 0.5 ng/mL: Systemic infection (sepsis) is not likely. Local bacterial infection is possible. (NOTE)       Sepsis PCT Algorithm  Lower Respiratory Tract                                      Infection PCT Algorithm    ----------------------------     ----------------------------         PCT < 0.25 ng/mL                PCT < 0.10 ng/mL         Strongly encourage             Strongly discourage   discontinuation of antibiotics    initiation of antibiotics    ----------------------------     -----------------------------       PCT 0.25 -  0.50 ng/mL            PCT 0.10 - 0.25 ng/mL               OR       >80% decrease in PCT            Discourage initiation of                                            antibiotics      Encourage discontinuation           of antibiotics    ----------------------------     -----------------------------         PCT >= 0.50 ng/mL              PCT 0.26 - 0.50 ng/mL               AND        <80% decrease in PCT             Encourage initiation of                                             antibiotics       Encourage continuation           of antibiotics    ----------------------------     -----------------------------        PCT >= 0.50 ng/mL                  PCT > 0.50 ng/mL               AND         increase in PCT                  Strongly encourage                                      initiation of antibiotics    Strongly encourage escalation           of antibiotics                                     -----------------------------  PCT <= 0.25 ng/mL                                                 OR                                        > 80% decrease in PCT                                     Discontinue / Do not initiate                                             antibiotics Performed at [place] [address], [place], [place] [address]   Lactic acid, plasma     Status: None   Collection Time: 12/30/18  8:41 AM  Result Value Ref Range   Lactic Acid, Venous 1.7 0.5 - 1.9 mmol/L    Comment: Performed at [facility], [place] [address] [place], [place] [address]   Dg Chest 2 View  Result Date: 12/30/2018 CLINICAL DATA:  Cough and shortness of breath for 3 months. Rheumatoid arthritis. EXAM: CHEST - 2 VIEW COMPARISON:  None. FINDINGS: Heart size is normal. Bilateral hilar lymphadenopathy is seen. Mild tracheal deviation to the left is suspicious for right paratracheal lymphadenopathy. Both lungs  are clear. No evidence of pleural effusion. IMPRESSION: No active lung disease. Bilateral hilar lymphadenopathy and suspected right paratracheal lymphadenopathy. Consider chest CT with contrast for further evaluation. Electronically Signed   By: Earle Gell M.D.   On: 12/30/2018 01:59   Ct Angio Chest Pe W And/or Wo Contrast  Result Date: 12/30/2018 CLINICAL DATA:  Cough, shortness of breath EXAM: CT ANGIOGRAPHY CHEST WITH CONTRAST TECHNIQUE: Multidetector CT imaging of the chest was performed using the standard protocol during bolus administration of intravenous contrast. Multiplanar CT image reconstructions and MIPs were obtained to evaluate the vascular anatomy. CONTRAST:  171mL OMNIPAQUE IOHEXOL 350 MG/ML SOLN COMPARISON:  Chest radiographs dated 12/30/2018 FINDINGS: Cardiovascular: Evaluation is mildly constrained by respiratory motion. Satisfactory opacification the bilateral pulmonary arteries to the segmental level. No evidence of pulmonary embolism. Heart is normal in size.  No pericardial effusion. No evidence thoracic aortic aneurysm or dissection. Mediastinum/Nodes: Extensive thoracic lymphadenopathy, including: --1.4 cm short axis right supraclavicular node (series 4/image 3) --2.2 cm short axis high right paratracheal node (series 4/image 12) --2.1 cm short axis prevascular node (series 4/image 21) --2.7 cm short axis right hilar node (series 4/image 30) --2.4 cm short axis subcarinal node (series 4/image 32) --2.2 cm short axis left infrahilar node (series 4/image 35) This appearance is nonspecific, with sarcoidosis and lymphoma considered most likely. No suspicious axillary lymphadenopathy. Visualized thyroid is unremarkable. Lungs/Pleura: Evaluation of the lung parenchyma is constrained by respiratory motion. Thickening of the peribronchovascular interstitium with peribronchovascular nodularity in the lungs bilaterally, upper lobe predominant (for example, series 8/image 41). Dominant nodule  measures 10 mm in the central right upper lobe (series 8/image 30). This appearance favors sarcoidosis. No focal consolidation. No pleural effusion or pneumothorax. Upper  Abdomen: Visualized upper abdomen is grossly unremarkable. Spleen is normal in size. Musculoskeletal: Visualized osseous structures are within normal limits. Review of the MIP images confirms the above findings. IMPRESSION: No evidence of pulmonary embolism. Peribronchovascular nodularity with thickening of the peribronchovascular interstitium in the lungs bilaterally. Associated thoracic lymphadenopathy, as described above. This combination of findings favors sarcoidosis. If tissue diagnosis is required, consider excision/sampling of the right supraclavicular node. Lymphoma is considered possible but considered less likely given the overall constellation of findings. Electronically Signed   By: Julian Hy M.D.   On: 12/30/2018 06:44    Pending Labs Unresulted Labs (From admission, onward)    Start     Ordered   12/31/18 0500  Procalcitonin  Daily,   R     12/30/18 0729   12/30/18 0910  Culture, Urine  Once,   R     12/30/18 0909   12/30/18 0301  Blood Culture (routine x 2)  BLOOD CULTURE X 2,   STAT    Question:  Patient immune status  Answer:  Immunocompromised   12/30/18 0303   Signed and Held  HIV antibody (Routine Testing)  Once,   R     Signed and Held   Signed and Held  Creatinine, serum  (enoxaparin (LOVENOX)    CrCl >/= 30 ml/min)  Weekly,   R    Comments:  while on enoxaparin therapy    Signed and Held   Signed and Held  Basic metabolic panel  Tomorrow morning,   R     Signed and Held   Signed and Held  CBC  Tomorrow morning,   R     Signed and Held          Vitals/Pain Today's Vitals   12/30/18 0700 12/30/18 0703 12/30/18 0805 12/30/18 0927  BP:   (!) 144/105 (!) 145/97  Pulse: (!) 111  (!) 107 (!) 120  Resp: (!) 27  (!) 23 (!) 28  Temp:   99.2 F (37.3 C)   TempSrc:   Oral   SpO2: 98%  98% 97%   Weight:      Height:      PainSc:  6  0-No pain     Isolation Precautions Droplet and Contact precautions  Medications Medications  sodium chloride (PF) 0.9 % injection (has no administration in time range)  sodium chloride flush (NS) 0.9 % injection 3 mL (3 mLs Intravenous Given 12/30/18 0258)  acetaminophen (TYLENOL) tablet 1,000 mg (1,000 mg Oral Given 12/30/18 0258)  sodium chloride 0.9 % bolus 1,000 mL (0 mLs Intravenous Stopped 12/30/18 0532)    And  sodium chloride 0.9 % bolus 600 mL (0 mLs Intravenous Stopped 12/30/18 0623)  azithromycin (ZITHROMAX) 500 mg in sodium chloride 0.9 % 250 mL IVPB (0 mg Intravenous Stopped 12/30/18 0532)  ceFEPIme (MAXIPIME) 2 g in sodium chloride 0.9 % 100 mL IVPB (0 g Intravenous Stopped 12/30/18 0422)  vancomycin (VANCOCIN) 2,000 mg in sodium chloride 0.9 % 500 mL IVPB (0 mg Intravenous Stopped 12/30/18 0738)  iohexol (OMNIPAQUE) 350 MG/ML injection 100 mL (100 mLs Intravenous Contrast Given 12/30/18 0617)    Mobility walks

## 2018-12-30 NOTE — Plan of Care (Signed)
  Problem: Safety: Goal: Ability to remain free from injury will improve Outcome: Progressing   

## 2018-12-30 NOTE — ED Notes (Signed)
Patient transported to CT 

## 2018-12-30 NOTE — ED Provider Notes (Signed)
[facility] DEPT Provider Note   CSN: [id number] Arrival date & time: 12/30/18  0125    History   Chief Complaint Chief Complaint  Patient presents with  . Cough  . Shortness of Breath    HPI Anna Lucero is a 43 y.o. female with a history of rheumatoid arthritis on Remicade infusions, PCOS, COPD, GERD, bronchitis, depression, and anxiety who presents to the emergency department with a chief complaint of chills.  The patient reports that she developed shaking chills at approximately midnight.  She did not check her temperature at home prior to arrival.  She reports that she has been having cough, wheezing, shortness of breath for the last 2 months.  She reports that she previously completed a course of Augmentin and a Z-Pak around the time that the symptoms began.  She reports that she is also recently been started on Qvar, albuterol, Singulair, and most recently was started on a course of prednisone 2 days ago.  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  She reports that she tested negative for COVID-19 last week.  She reports that over the last 2 months that her shortness of breath, wheezing, cough have worsened.  She is also endorsing right-sided, nonradiating, constant chest pain, characterized as sharp.  Pain is not pleuritic.  No history of similar pain.  No known aggravating or alleviating factors.  She denies dizziness, lightheadedness, headache, abdominal pain, nausea, vomiting, diarrhea, urinary symptoms, leg swelling, palpitations, rash, numbness, or weakness.  She is unsure if she has a family history of VTE.  No personal history of DVT or PE.     The history is provided by the patient and medical records. No language interpreter was used.    Past Medical History:  Diagnosis Date  . Anemia   . Anxiety   . Bronchitis   . Depression   . GERD (gastroesophageal reflux disease)   . Gestational  diabetes   . Iritis   . PCOS (polycystic ovarian syndrome)   . RA (rheumatoid arthritis) [facility])     Patient Active Problem List   Diagnosis Date Noted  . SIRS (systemic inflammatory response syndrome) ([place]) 12/30/2018  . Class 2 obesity due to excess calories without serious comorbidity in adult 07/04/2018  . PTSD (post-traumatic stress disorder) 07/02/2018  . Iron deficiency anemia due to chronic blood loss 06/27/2018  . Nuclear sclerotic cataract of both eyes 04/09/2018  . 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 Retinal edema 04/09/2018  . Maternal age 45+, multigravida, antepartum 01/03/2018  . Drug therapy 10/27/2017  . Uterine prolapse 09/26/2017  . Miscarriage 08/30/2017  . Anemia 05/25/2017  . Gastro-esophageal reflux disease with esophagitis 05/25/2017  . Polycystic ovaries 05/25/2017  . Rheumatoid arthritis with positive rheumatoid factor ([place]) 05/25/2017    Past Surgical History:  Procedure Laterality Date  . abdominal plasty    . BREAST REDUCTION SURGERY    . DILATION AND CURETTAGE OF UTERUS    . DILATION AND EVACUATION N/A 08/09/2017   Procedure: DILATATION AND EVACUATION;  Surgeon: Allyn Kenner, DO;  Location: [facility];  Service: Gynecology;  Laterality: N/A;  . filopian tube removal    . REDUCTION MAMMAPLASTY Bilateral      OB History    Gravida  6   Para  3   Term  3   Preterm      AB  3   Living  3     SAB  1   TAB  1   Ectopic  1   Multiple      Live Births           Obstetric Comments  08/2017- D&C for MAB 12/2017- surgical TAB @ ~10 wks         Home Medications    Prior to Admission medications   Medication Sig Start Date End Date Taking? Authorizing Provider  ARIPiprazole (ABILIFY) 5 MG tablet Take 1 tablet (5 mg total) by mouth daily. 10/25/18 10/25/19  Arfeen, Arlyce Harman, MD  citalopram (CELEXA) 40 MG tablet Take 1 tablet (40 mg total) by mouth daily. 10/25/18 10/25/19  Kathlee Nations, MD   cyclobenzaprine (FLEXERIL) 10 MG tablet  09/04/18   [provider]  doxepin (SINEQUAN) 25 MG capsule Take 1 capsule (25 mg total) by mouth at bedtime as needed (insomnia). 10/25/18   Arfeen, Arlyce Harman, MD  inFLIXimab (REMICADE IV) Inject into the vein.    [provider]  meloxicam (MOBIC) 7.5 MG tablet Take by mouth. 10/01/18   [provider]  NUVARING 0.12-0.015 MG/24HR vaginal ring I 1 RING VAGINALLY Q MONTH AS DIRECTED 06/18/18   [provider]  omeprazole (PRILOSEC) 20 MG capsule Take 20 mg by mouth daily.     [provider]  predniSONE (DELTASONE) 10 MG tablet Take 10 mg by mouth daily with breakfast.    [provider]    Family History Family History  Problem Relation Age of Onset  . Alcohol abuse Mother   . Drug abuse Mother   . Bipolar disorder Mother   . Anxiety disorder Mother   . Depression Mother   . Alcohol abuse Father   . Drug abuse Father   . Anxiety disorder Sister   . Depression Sister   . Breast cancer Neg Hx     Social History Social History   Tobacco Use  . Smoking status: Never Smoker  . Smokeless tobacco: Never Used  Substance Use Topics  . Alcohol use: Yes    Alcohol/week: 3.0 standard drinks    Types: 3 Glasses of wine per week    Comment: 3x a week  . Drug use: No     Allergies   Lamictal [lamotrigine]; Celebrex [celecoxib]; and Spironolactone   Review of Systems Review of Systems  Constitutional: Positive for chills and fever. Negative for activity change.  HENT: Negative for congestion and sore throat.   Eyes: Negative for visual disturbance.  Respiratory: Positive for cough, chest tightness, shortness of breath and wheezing.   Cardiovascular: Positive for chest pain. Negative for palpitations and leg swelling.  Gastrointestinal: Negative for abdominal pain.  Genitourinary: Negative for dysuria, flank pain, hematuria and urgency.  Musculoskeletal: Negative for back pain.  Skin:  Negative for rash.  Allergic/Immunologic: Negative for immunocompromised state.  Neurological: Negative for dizziness, weakness, numbness and headaches.  Psychiatric/Behavioral: Negative for confusion.     Physical Exam Updated Vital Signs BP (!) 144/105 (BP Location: Left Arm)   Pulse (!) 107   Temp 99.2 F (37.3 C) (Oral)   Resp (!) 23   Ht 5\' 2"  (1.575 m)   Wt 97.5 kg   LMP 12/16/2018   SpO2 98%   BMI 39.32 kg/m   Physical Exam Vitals signs and nursing note reviewed.  Constitutional:      General: She is not in acute distress.    Appearance: She is obese. She is ill-appearing.  HENT:  Head: Normocephalic.  Eyes:     Conjunctiva/sclera: Conjunctivae normal.  Neck:     Musculoskeletal: Neck supple.  Cardiovascular:     Rate and Rhythm: Regular rhythm. Tachycardia present.     Heart sounds: No murmur. No friction rub. No gallop.   Pulmonary:     Effort: Pulmonary effort is normal. No respiratory distress.     Comments: Mildly tachypneic, but on room air.  No retractions, accessory muscle use, or nasal flaring.  Coarse, wet breath sounds noted with inspiration and expiration bilaterally in all fields.  Good air movement bilaterally. Abdominal:     General: There is no distension.     Palpations: Abdomen is soft. There is no mass.     Tenderness: There is no abdominal tenderness. There is no right CVA tenderness, left CVA tenderness, guarding or rebound.     Hernia: No hernia is present.  Musculoskeletal:     Right lower leg: She exhibits no tenderness. No edema.     Left lower leg: She exhibits no tenderness. No edema.  Skin:    General: Skin is warm.     Capillary Refill: Capillary refill takes less than 2 seconds.     Findings: No rash.  Neurological:     Mental Status: She is alert.  Psychiatric:        Behavior: Behavior normal.    Sepsis - Repeat Assessment  Performed at:    06:30 AM  Vitals     Blood pressure (!) 144/105, pulse (!) 107, temperature  99.2 F (37.3 C), temperature source Oral, resp. rate (!) 23, height 5\' 2"  (1.575 m), weight 97.5 kg, last menstrual period 12/16/2018, SpO2 98 %, unknown if currently breastfeeding.  Heart:     Tachycardic  Lungs:    Coarse breath sounds throughout  Capillary Refill:   <2 sec  Peripheral Pulse:   Radial pulse palpable  Skin:     Normal Color  ED Treatments / Results  Labs (all labs ordered are listed, but only abnormal results are displayed) Labs Reviewed  LACTIC ACID, PLASMA - Abnormal; Notable for the following components:      Result Value   Lactic Acid, Venous 2.4 (*)    All other components within normal limits  LACTIC ACID, PLASMA - Abnormal; Notable for the following components:   Lactic Acid, Venous 2.3 (*)    All other components within normal limits  COMPREHENSIVE METABOLIC PANEL - Abnormal; Notable for the following components:   Potassium 3.3 (*)    Glucose, Bld 107 (*)    Albumin 3.4 (*)    All other components within normal limits  CBC WITH DIFFERENTIAL/PLATELET - Abnormal; Notable for the following components:   WBC 12.0 (*)    Neutro Abs 10.3 (*)    All other components within normal limits  SARS CORONAVIRUS 2 (HOSPITAL ORDER, [place] [facility])  CULTURE, BLOOD (ROUTINE X 2)  CULTURE, BLOOD (ROUTINE X 2)  BRAIN NATRIURETIC PEPTIDE  TROPONIN I  URINALYSIS, ROUTINE W REFLEX MICROSCOPIC  PROCALCITONIN  LACTIC ACID, PLASMA  I-STAT BETA HCG BLOOD, ED ([facility], [facility], [facility] ONLY)    EKG EKG Interpretation  Date/Time:  Sunday Dec 30 2018 03:23:03 EDT Ventricular Rate:  124 PR Interval:    QRS Duration: 87 QT Interval:  315 QTC Calculation: 453 R Axis:   22 Text Interpretation:  Sinus tachycardia Low voltage, precordial leads Borderline repolarization abnormality No previous ECGs available Confirmed by Ripley Fraise [id number]) on 12/30/2018 3:37:43 AM  Radiology Dg Chest 2 View  Result Date: 12/30/2018 CLINICAL DATA:  Cough and shortness  of breath for 3 months. Rheumatoid arthritis. EXAM: CHEST - 2 VIEW COMPARISON:  None. FINDINGS: Heart size is normal. Bilateral hilar lymphadenopathy is seen. Mild tracheal deviation to the left is suspicious for right paratracheal lymphadenopathy. Both lungs are clear. No evidence of pleural effusion. IMPRESSION: No active lung disease. Bilateral hilar lymphadenopathy and suspected right paratracheal lymphadenopathy. Consider chest CT with contrast for further evaluation. Electronically Signed   By: Earle Gell M.D.   On: 12/30/2018 01:59   Ct Angio Chest Pe W And/or Wo Contrast  Result Date: 12/30/2018 CLINICAL DATA:  Cough, shortness of breath EXAM: CT ANGIOGRAPHY CHEST WITH CONTRAST TECHNIQUE: Multidetector CT imaging of the chest was performed using the standard protocol during bolus administration of intravenous contrast. Multiplanar CT image reconstructions and MIPs were obtained to evaluate the vascular anatomy. CONTRAST:  11mL OMNIPAQUE IOHEXOL 350 MG/ML SOLN COMPARISON:  Chest radiographs dated 12/30/2018 FINDINGS: Cardiovascular: Evaluation is mildly constrained by respiratory motion. Satisfactory opacification the bilateral pulmonary arteries to the segmental level. No evidence of pulmonary embolism. Heart is normal in size.  No pericardial effusion. No evidence thoracic aortic aneurysm or dissection. Mediastinum/Nodes: Extensive thoracic lymphadenopathy, including: --1.4 cm short axis right supraclavicular node (series 4/image 3) --2.2 cm short axis high right paratracheal node (series 4/image 12) --2.1 cm short axis prevascular node (series 4/image 21) --2.7 cm short axis right hilar node (series 4/image 30) --2.4 cm short axis subcarinal node (series 4/image 32) --2.2 cm short axis left infrahilar node (series 4/image 35) This appearance is nonspecific, with sarcoidosis and lymphoma considered most likely. No suspicious axillary lymphadenopathy. Visualized thyroid is unremarkable. Lungs/Pleura:  Evaluation of the lung parenchyma is constrained by respiratory motion. Thickening of the peribronchovascular interstitium with peribronchovascular nodularity in the lungs bilaterally, upper lobe predominant (for example, series 8/image 41). Dominant nodule measures 10 mm in the central right upper lobe (series 8/image 30). This appearance favors sarcoidosis. No focal consolidation. No pleural effusion or pneumothorax. Upper Abdomen: Visualized upper abdomen is grossly unremarkable. Spleen is normal in size. Musculoskeletal: Visualized osseous structures are within normal limits. Review of the MIP images confirms the above findings. IMPRESSION: No evidence of pulmonary embolism. Peribronchovascular nodularity with thickening of the peribronchovascular interstitium in the lungs bilaterally. Associated thoracic lymphadenopathy, as described above. This combination of findings favors sarcoidosis. If tissue diagnosis is required, consider excision/sampling of the right supraclavicular node. Lymphoma is considered possible but considered less likely given the overall constellation of findings. Electronically Signed   By: Julian Hy M.D.   On: 12/30/2018 06:44    Procedures .Critical Care Performed by: Joanne Gavel, PA-C Authorized by: Joanne Gavel, PA-C   Critical care provider statement:    Critical care time (minutes):  45   Critical care time was exclusive of:  Separately billable procedures and treating other patients and teaching time   Critical care was necessary to treat or prevent imminent or life-threatening deterioration of the following conditions:  Sepsis   Critical care was time spent personally by me on the following activities:  Ordering and performing treatments and interventions, ordering and review of laboratory studies, ordering and review of radiographic studies, pulse oximetry, re-evaluation of patient's condition, review of old charts, development of treatment plan with  patient or surrogate, evaluation of patient's response to treatment, examination of patient and obtaining history from patient or surrogate   (including critical care time)  Medications Ordered  in ED Medications  sodium chloride (PF) 0.9 % injection (has no administration in time range)  sodium chloride flush (NS) 0.9 % injection 3 mL (3 mLs Intravenous Given 12/30/18 0258)  acetaminophen (TYLENOL) tablet 1,000 mg (1,000 mg Oral Given 12/30/18 0258)  sodium chloride 0.9 % bolus 1,000 mL (0 mLs Intravenous Stopped 12/30/18 0532)    And  sodium chloride 0.9 % bolus 600 mL (0 mLs Intravenous Stopped 12/30/18 0623)  azithromycin (ZITHROMAX) 500 mg in sodium chloride 0.9 % 250 mL IVPB (0 mg Intravenous Stopped 12/30/18 0532)  ceFEPIme (MAXIPIME) 2 g in sodium chloride 0.9 % 100 mL IVPB (0 g Intravenous Stopped 12/30/18 0422)  vancomycin (VANCOCIN) 2,000 mg in sodium chloride 0.9 % 500 mL IVPB (0 mg Intravenous Stopped 12/30/18 0738)  iohexol (OMNIPAQUE) 350 MG/ML injection 100 mL (100 mLs Intravenous Contrast Given 12/30/18 0617)     Initial Impression / Assessment and Plan / ED Course  I have reviewed the triage vital signs and the nursing notes.  Pertinent labs & imaging results that were available during my care of the patient were reviewed by me and considered in my medical decision making (see chart for details).        43 year old female with a history of rheumatoid arthritis on Remicade infusions, PCOS, COPD, GERD, bronchitis, depression, and anxiety presenting with shaking chills that began last night.  She was found to be tachycardic to 101.6 with heart rate in the 130s and tachypneic.  She is not hypoxic.  She is mildly hypertensive.  She reports that she has been having shortness of breath, cough, and wheezing for the last 2 months despite treatment with Augmentin, azithromycin, prednisone that she started 2 days ago, albuterol, Qvar, and Singulair.  The patient was discussed with Dr.  Christy Gentles, attending physician.   Patient has a leukocytosis of 12.  Lactate is elevated at 2.3.  Sepsis order set was initiated.  She was given a 30 cc/kg fluid bolus.  Blood cultures x2 were ordered.  She was given cefepime and vancomycin for suspected pulmonary source of sepsis.  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  Tylenol given for fever.  On temperature recheck, fever is improving, but patient continues to be tachycardic 115-125.  As COVID test is still pending, with continued tachycardia there is concern for hypercoagulable state since the patient is high risk for COVID-19.  Chest x-ray demonstrating bilateral hilar lymphadenopathy and suspected right paratracheal lymphadenopathy noted from mild tracheal deviation to the left.  CT chest with contrast is recommended for further evaluation.  EKG with sinus tachycardia.  Troponin is negative.  She has mild hypokalemia of 3.3.  Given continued tachycardia, CT PE study was ordered, which demonstrated peribronchovascular nodularity with thickening of the peribronchovascular interstitium in the lungs bilaterally.  There is also associated thoracic lymphadenopathy as described above.  Combination of findings favor sarcoidosis.  Lymphoma is also possible but considered less likely given the overall constellation of findings.  No infiltrates or pneumonia mentioned.  Consider sepsis secondary to initial sarcoidosis flare versus sepsis of unknown etiology  Consulted the hospitalist team and spoke with Dr. Lonny Prude who will accept the patient for admission and further work-up. The patient appears reasonably stabilized for admission considering the current resources, flow, and capabilities available in the ED at this time, and I doubt any other [facility] requiring further screening and/or treatment in the ED prior to admission.   Final Clinical Impressions(s) /  ED Diagnoses   Final diagnoses:  Sepsis, due to  unspecified organism, unspecified whether acute organ dysfunction present [facility])    ED Discharge Orders    None       Joanne Gavel, PA-C 12/30/18 0840    Ripley Fraise, MD 12/31/18 [phone]

## 2018-12-30 NOTE — ED Notes (Signed)
Date and time results received: 12/30/18 0416 (use smartphrase ".now" to insert current time)  Test: lactic acid Critical Value: 2.3  Name of Provider Notified: wickline md  Orders Received? Or Actions Taken?: waiting on orders

## 2018-12-30 NOTE — H&P (Addendum)
History and Physical    Anna Lucero PYP:[id number] DOB: [birth year] DOA: 12/30/2018  PCP: Precious Gilding, PA Patient coming from: Home  Chief Complaint: Fever, malaise  HPI: Anna Lucero is a 42 y.o. female with medical history significant of GERD, Obesity, rheumatoid arthritis.  Patient presented secondary to fever and myalgias overnight.  She also had associated Reiger's with concern for seizure activity.  She reports a 1-monthhistory of worsening cough and wheezing. She has been treated for bronchitis multiple times in this time frame with antibiotics and prednisone bursts without improvement. She developed some shortness of breath yesterday with her episode of fever myalgias.  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  ED Course: Vitals: Tmax of , pulse of 110-120s, RR in mid-20s, BP of 140-150s/99-100s, SpO2 of 98% on room Labs: Potassium of 3.3, lactic acid 2.4 -> 2.3, WBC 12 Imaging: Chest x-ray without infiltrates.  CT PE with evidence of peribronchovascular nodularity, lymphadenopathy concerning for sarcoidosis Medications/Course: Tylenol 1000 mg, Vancomycin, Cefepime, 1.6 L NS bolus  Review of Systems: Review of Systems  Constitutional: Positive for chills and fever. Negative for malaise/fatigue.  Respiratory: Positive for cough, sputum production, shortness of breath and wheezing.   Cardiovascular: Negative for chest pain.  Gastrointestinal: Negative for abdominal pain, constipation, diarrhea, nausea and vomiting.    Past Medical History:  Diagnosis Date   Anemia    Anxiety    Bronchitis    Depression    GERD (gastroesophageal reflux disease)    Gestational diabetes    Iritis    PCOS (polycystic ovarian syndrome)    RA (rheumatoid arthritis) ([place]     Past Surgical History:  Procedure Laterality Date   abdominal plasty     BREAST REDUCTION SURGERY     DILATION AND CURETTAGE OF UTERUS     DILATION AND EVACUATION N/A  08/09/2017   Procedure: DILATATION AND EVACUATION;  Surgeon: CAllyn Kenner DO;  [facility];  Service: Gynecology;  Laterality: N/A;   filopian tube removal     REDUCTION MAMMAPLASTY Bilateral      reports that she has never smoked. She has never used smokeless tobacco. She reports current alcohol use of about 3.0 standard drinks of alcohol per week. She reports that she does not use drugs.  Allergies  Allergen Reactions   Lamictal [Lamotrigine]     Skin rash, likely not SJS but concerning   Celebrex [Celecoxib] Other (See Comments)    Chest pain   Spironolactone Other (See Comments)    Bleeding    Family History  Problem Relation Age of Onset   Alcohol abuse Mother    Drug abuse Mother    Bipolar disorder Mother    Anxiety disorder Mother    Depression Mother    Alcohol abuse Father    Drug abuse Father    Anxiety disorder Sister    Depression Sister    Breast cancer Neg Hx    Prior to Admission medications   Medication Sig Start Date End Date Taking? Authorizing Provider  ARIPiprazole (ABILIFY) 5 MG tablet Take 1 tablet (5 mg total) by mouth daily. 10/25/18 10/25/19  Arfeen, SArlyce Harman MD  citalopram (CELEXA) 40 MG tablet Take 1 tablet (40 mg total) by mouth daily. 10/25/18 10/25/19  AKathlee Nations MD  cyclobenzaprine (FLEXERIL) 10 MG tablet  09/04/18   [provider]  doxepin (SINEQUAN) 25 MG capsule Take 1 capsule (25 mg total) by mouth at bedtime  as needed (insomnia). 10/25/18   Arfeen, Arlyce Harman, MD  inFLIXimab (REMICADE IV) Inject into the vein.    [provider]  meloxicam (MOBIC) 7.5 MG tablet Take by mouth. 10/01/18   [provider]  NUVARING 0.12-0.015 MG/24HR vaginal ring I 1 RING VAGINALLY Q MONTH AS DIRECTED 06/18/18   [provider]  omeprazole (PRILOSEC) 20 MG capsule Take 20 mg by mouth daily.     [provider]    Physical Exam:  Physical Exam Constitutional:      General: She is not in acute  distress.    Appearance: She is well-developed. She is not diaphoretic.  Eyes:     Conjunctiva/sclera: Conjunctivae normal.     Pupils: Pupils are equal, round, and reactive to light.  Neck:     Musculoskeletal: Normal range of motion.  Cardiovascular:     Rate and Rhythm: Regular rhythm. Tachycardia present.     Heart sounds: Normal heart sounds. No murmur.  Pulmonary:     Effort: Pulmonary effort is normal. No tachypnea, accessory muscle usage or respiratory distress.     Breath sounds: Wheezing present. No rales.  Abdominal:     General: Bowel sounds are normal. There is no distension.     Palpations: Abdomen is soft.     Tenderness: There is no abdominal tenderness. There is no guarding or rebound.  Musculoskeletal: Normal range of motion.        General: No tenderness.  Lymphadenopathy:     Cervical: No cervical adenopathy.  Skin:    General: Skin is warm and dry.  Neurological:     Mental Status: She is alert and oriented to person, place, and time.  Psychiatric:        Mood and Affect: Mood normal. Mood is not anxious.    Labs on Admission: I have personally reviewed following labs and imaging studies  CBC: Recent Labs  Lab 12/30/18 0249  WBC 12.0*  NEUTROABS 10.3*  HGB 13.5  HCT 41.7  MCV 89.9  PLT 300    Basic Metabolic Panel: Recent Labs  Lab 12/30/18 0249  NA 140  K 3.3*  CL 108  CO2 22  GLUCOSE 107*  BUN 14  CREATININE 0.81  CALCIUM 9.1    GFR: Estimated Creatinine Clearance: 97.7 mL/min (by C-G formula based on SCr of 0.81 mg/dL).  Liver Function Tests: Recent Labs  Lab 12/30/18 0249  AST 32  ALT 29  ALKPHOS 86  BILITOT 0.3  PROT 7.9  ALBUMIN 3.4*   No results for input(s): LIPASE, AMYLASE in the last 168 hours. No results for input(s): AMMONIA in the last 168 hours.  Coagulation Profile: No results for input(s): INR, PROTIME in the last 168 hours.  Cardiac Enzymes: Recent Labs  Lab 12/30/18 0302  TROPONINI <0.03    BNP  (last 3 results) No results for input(s): PROBNP in the last 8760 hours.  HbA1C: No results for input(s): HGBA1C in the last 72 hours.  CBG: No results for input(s): GLUCAP in the last 168 hours.  Lipid Profile: No results for input(s): CHOL, HDL, LDLCALC, TRIG, CHOLHDL, LDLDIRECT in the last 72 hours.  Thyroid Function Tests: No results for input(s): TSH, T4TOTAL, FREET4, T3FREE, THYROIDAB in the last 72 hours.  Anemia Panel: No results for input(s): VITAMINB12, FOLATE, FERRITIN, TIBC, IRON, RETICCTPCT in the last 72 hours.  Urine analysis:    Component Value Date/Time   COLORURINE YELLOW 03/11/2018 1616   APPEARANCEUR HAZY (A) 03/11/2018 1616  LABSPEC 1.028 03/11/2018 1616   PHURINE 6.0 03/11/2018 1616   GLUCOSEU NEGATIVE 03/11/2018 1616   HGBUR MODERATE (A) 03/11/2018 1616   BILIRUBINUR NEGATIVE 03/11/2018 1616   KETONESUR NEGATIVE 03/11/2018 1616   PROTEINUR 30 (A) 03/11/2018 1616   NITRITE NEGATIVE 03/11/2018 1616   LEUKOCYTESUR TRACE (A) 03/11/2018 1616     Radiological Exams on Admission: Dg Chest 2 View  Result Date: 12/30/2018 CLINICAL DATA:  Cough and shortness of breath for 3 months. Rheumatoid arthritis. EXAM: CHEST - 2 VIEW COMPARISON:  None. FINDINGS: Heart size is normal. Bilateral hilar lymphadenopathy is seen. Mild tracheal deviation to the left is suspicious for right paratracheal lymphadenopathy. Both lungs are clear. No evidence of pleural effusion. IMPRESSION: No active lung disease. Bilateral hilar lymphadenopathy and suspected right paratracheal lymphadenopathy. Consider chest CT with contrast for further evaluation. Electronically Signed   By: Earle Gell M.D.   On: 12/30/2018 01:59   Ct Angio Chest Pe W And/or Wo Contrast  Result Date: 12/30/2018 CLINICAL DATA:  Cough, shortness of breath EXAM: CT ANGIOGRAPHY CHEST WITH CONTRAST TECHNIQUE: Multidetector CT imaging of the chest was performed using the standard protocol during bolus administration of  intravenous contrast. Multiplanar CT image reconstructions and MIPs were obtained to evaluate the vascular anatomy. CONTRAST:  129m OMNIPAQUE IOHEXOL 350 MG/ML SOLN COMPARISON:  Chest radiographs dated 12/30/2018 FINDINGS: Cardiovascular: Evaluation is mildly constrained by respiratory motion. Satisfactory opacification the bilateral pulmonary arteries to the segmental level. No evidence of pulmonary embolism. Heart is normal in size.  No pericardial effusion. No evidence thoracic aortic aneurysm or dissection. Mediastinum/Nodes: Extensive thoracic lymphadenopathy, including: --1.4 cm short axis right supraclavicular node (series 4/image 3) --2.2 cm short axis high right paratracheal node (series 4/image 12) --2.1 cm short axis prevascular node (series 4/image 21) --2.7 cm short axis right hilar node (series 4/image 30) --2.4 cm short axis subcarinal node (series 4/image 32) --2.2 cm short axis left infrahilar node (series 4/image 35) This appearance is nonspecific, with sarcoidosis and lymphoma considered most likely. No suspicious axillary lymphadenopathy. Visualized thyroid is unremarkable. Lungs/Pleura: Evaluation of the lung parenchyma is constrained by respiratory motion. Thickening of the peribronchovascular interstitium with peribronchovascular nodularity in the lungs bilaterally, upper lobe predominant (for example, series 8/image 41). Dominant nodule measures 10 mm in the central right upper lobe (series 8/image 30). This appearance favors sarcoidosis. No focal consolidation. No pleural effusion or pneumothorax. Upper Abdomen: Visualized upper abdomen is grossly unremarkable. Spleen is normal in size. Musculoskeletal: Visualized osseous structures are within normal limits. Review of the MIP images confirms the above findings. IMPRESSION: No evidence of pulmonary embolism. Peribronchovascular nodularity with thickening of the peribronchovascular interstitium in the lungs bilaterally. Associated thoracic  lymphadenopathy, as described above. This combination of findings favors sarcoidosis. If tissue diagnosis is required, consider excision/sampling of the right supraclavicular node. Lymphoma is considered possible but considered less likely given the overall constellation of findings. Electronically Signed   By: SJulian HyM.D.   On: 12/30/2018 06:44    EKG: Independently reviewed. Sinus tachycardia  Assessment/Plan Principal Problem:   SIRS (systemic inflammatory response syndrome) (HCC) Active Problems:   Gastro-esophageal reflux disease with esophagitis   Rheumatoid arthritis with positive rheumatoid factor (HCC)   Obesity (BMI 30-39.9)    SIRS Criteria met on admission with tachycardia, tachypnea, fever and leukocytosis. Possible pulmonary source, however, no active disease found.  No other symptoms.  Blood cultures are pending.  COVID-19 testing is negative. -Urine culture (obtained after empiric antibiotics given) and  follow-up blood cultures -Continue Vancomycin/Cefepime -Repeat lactic acid -Procalcitonin  Possible sarcoidosis This could be contributing to presentation.  Per CT report, the right supraclavicular node would serve as a good biopsy location  Rheumatoid arthritis Patient is on Remicade as an outpatient.  Elevated blood pressure No history of hypertension per patient.  She does get hypertensive with prednisone use. -Hydralazine IV as needed  Iron deficiency anemia Appears to have resolved.  Obesity Body mass index is 39.32 kg/m.  DVT prophylaxis: Lovenox Code Status: Full code Family Communication: None Disposition Plan: Stepdown Consults called: None Admission status: Inpatient   Cordelia Poche, MD [facility] 12/30/2018, 8:59 AM  If 7PM-7AM, please contact night-coverage [email or web address] Password [email or web address]

## 2018-12-30 NOTE — Progress Notes (Signed)
Pharmacy Antibiotic Note  Anna Lucero is a 43 y.o. female with hx RA on MTX and infliximab PTA, presented to the ED on 12/30/2018 with c/o SOB, fever and cough. To start broad abx with vancomycin and cefepime for suspected sepsis.  Plan: - cefepime 2gm IV q8h - vancomycin 2000 mg IV x1 given in the ED, then 750 mg IV q12h for est AUC 489  ________________________________________  Height: 5\' 2"  (157.5 cm) Weight: 215 lb (97.5 kg) IBW/kg (Calculated) : 50.1  Temp (24hrs), Avg:99.5 F (37.5 C), Min:98.1 F (36.7 C), Max:101.6 F (38.7 C)  Recent Labs  Lab 12/30/18 0249 12/30/18 0841  WBC 12.0*  --   CREATININE 0.81  --   LATICACIDVEN 2.3*  2.4* 1.7    Estimated Creatinine Clearance: 97.7 mL/min (by C-G formula based on SCr of 0.81 mg/dL).    Allergies  Allergen Reactions  . 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 Celebrex [Celecoxib] Other (See Comments)    Chest pain  . Spironolactone Other (See Comments)    Bleeding    Thank you for allowing pharmacy to be a part of this patient's care.  Lynelle Doctor 12/30/2018 11:54 AM

## 2018-12-30 NOTE — ED Notes (Signed)
Date and time results received: 12/30/18 4:09 AM  (use smartphrase ".now" to insert current time)  Test: Lactic Acid Critical Value: 2.4  Name of Provider Notified: Mia, PA  Orders Received? Or Actions Taken?: no additional orders at this time

## 2018-12-30 NOTE — ED Notes (Signed)
Bed: WTR6 Expected date:  Expected time:  Means of arrival:  Comments: 

## 2018-12-31 ENCOUNTER — Inpatient Hospital Stay (HOSPITAL_COMMUNITY): Payer: BLUE CROSS/BLUE SHIELD

## 2018-12-31 ENCOUNTER — Encounter (HOSPITAL_COMMUNITY): Payer: Self-pay | Admitting: Nephrology

## 2018-12-31 DIAGNOSIS — R590 Localized enlarged lymph nodes: Secondary | ICD-10-CM | POA: Diagnosis present

## 2018-12-31 DIAGNOSIS — M05771 Rheumatoid arthritis with rheumatoid factor of right ankle and foot without organ or systems involvement: Secondary | ICD-10-CM

## 2018-12-31 LAB — BASIC METABOLIC PANEL
Anion gap: 10 (ref 5–15)
BUN: 8 mg/dL (ref 6–20)
CO2: 21 mmol/L — ABNORMAL LOW (ref 22–32)
Calcium: 8.8 mg/dL — ABNORMAL LOW (ref 8.9–10.3)
Chloride: 104 mmol/L (ref 98–111)
Creatinine, Ser: 0.72 mg/dL (ref 0.44–1.00)
GFR calc Af Amer: >60 mL/min (ref >60)
GFR calc non Af Amer: >60 mL/min (ref >60)
Glucose, Bld: 108 mg/dL — ABNORMAL HIGH (ref 70–99)
Potassium: 3.3 mmol/L — ABNORMAL LOW (ref 3.5–5.1)
Sodium: 135 mmol/L (ref 135–145)

## 2018-12-31 LAB — URINE CULTURE: Culture: NO GROWTH

## 2018-12-31 LAB — CBC
HCT: 42.8 % (ref 36.0–46.0)
Hemoglobin: 12.9 g/dL (ref 12.0–15.0)
MCH: 27.2 pg (ref 26.0–34.0)
MCHC: 30.1 g/dL (ref 30.0–36.0)
MCV: 90.3 fL (ref 80.0–100.0)
Platelets: 333 10*3/uL (ref 150–400)
RBC: 4.74 MIL/uL (ref 3.87–5.11)
RDW: 13.6 % (ref 11.5–15.5)
WBC: 8.6 10*3/uL (ref 4.0–10.5)
nRBC: 0 % (ref 0.0–0.2)

## 2018-12-31 LAB — PROCALCITONIN: Procalcitonin: <0.1 ng/mL

## 2018-12-31 LAB — PROTIME-INR
INR: 1 (ref 0.8–1.2)
Prothrombin Time: 12.6 seconds (ref 11.4–15.2)

## 2018-12-31 LAB — HIV ANTIBODY (ROUTINE TESTING W REFLEX): HIV Screen 4th Generation wRfx: NONREACTIVE

## 2018-12-31 MED ORDER — ENOXAPARIN SODIUM 40 MG/0.4ML ~~LOC~~ SOLN
40.0000 mg | SUBCUTANEOUS | Status: DC
  Administered 2019-01-01: 40 mg via SUBCUTANEOUS
  Filled 2018-12-31 (×2): qty 0.4

## 2018-12-31 MED ORDER — MIDAZOLAM HCL 2 MG/2ML IJ SOLN
INTRAMUSCULAR | Status: AC | PRN
  Administered 2018-12-31 (×2): 1 mg via INTRAVENOUS

## 2018-12-31 MED ORDER — LIDOCAINE HCL (PF) 1 % IJ SOLN
INTRAMUSCULAR | Status: AC | PRN
  Administered 2018-12-31: 10 mL

## 2018-12-31 MED ORDER — FENTANYL CITRATE (PF) 100 MCG/2ML IJ SOLN
INTRAMUSCULAR | Status: AC | PRN
  Administered 2018-12-31 (×2): 50 ug via INTRAVENOUS

## 2018-12-31 MED ORDER — SODIUM CHLORIDE (PF) 0.9 % IJ SOLN
INTRAMUSCULAR | Status: AC
  Filled 2018-12-31: qty 50

## 2018-12-31 MED ORDER — FENTANYL CITRATE (PF) 100 MCG/2ML IJ SOLN
INTRAMUSCULAR | Status: AC
  Filled 2018-12-31: qty 2

## 2018-12-31 MED ORDER — IOHEXOL 300 MG/ML  SOLN
100.0000 mL | Freq: Once | INTRAMUSCULAR | Status: AC | PRN
  Administered 2018-12-31: 100 mL via INTRAVENOUS

## 2018-12-31 MED ORDER — MIDAZOLAM HCL 2 MG/2ML IJ SOLN
INTRAMUSCULAR | Status: AC
  Filled 2018-12-31: qty 2

## 2018-12-31 NOTE — Procedures (Signed)
Pre Procedure Dx: Lymphadenopathy Post Procedural Dx: Same  Technically successful US guided biopsy of enlarged right cervical lymph node.   EBL: None  No immediate complications.   Ronny Bacon, MD Pager #: [phone]

## 2018-12-31 NOTE — Progress Notes (Signed)
[facility] Progress Note  Subjective: states she feels much better today, chills/ fevers better.  No n/v/d.   Vitals:   12/31/18 0900 12/31/18 1000 12/31/18 1100 12/31/18 1110  BP: (!) 142/91 132/81 124/71   Pulse: 98 (!) 105 (!) 108   Resp: 17 18 20    Temp: 98.5 F (36.9 C)   98.4 F (36.9 C)  TempSrc: Oral   Oral  SpO2: 93% 96% 94%   Weight:      Height:        Inpatient medications: . amLODipine  10 mg Oral Daily  . ARIPiprazole  5 mg Oral Daily  . citalopram  40 mg Oral Daily  . enoxaparin (LOVENOX) injection  40 mg Subcutaneous Q24H  . pantoprazole  40 mg Oral Daily   . ceFEPime (MAXIPIME) IV Stopped (12/31/18 1214)  . vancomycin Stopped (12/31/18 3664)   acetaminophen **OR** acetaminophen, albuterol, doxepin, hydrALAZINE, HYDROcodone-acetaminophen, HYDROcodone-homatropine, ondansetron **OR** ondansetron (ZOFRAN) IV, polyethylene glycol  Exam:  alert, no distress, calm  no jvd Chest cta bilat  Cor reg no mrg  Abd soft ntnd +bs  Ext no edema  NF, Ox 3    Presentation Summary: Anna Lucero is a 43 y.o. female with medical history significant of GERD, Obesity, rheumatoid arthritis.  Patient presented secondary to fever and myalgias overnight.  She also had associated Reiger's with concern for seizure activity.  She reports a 25-monthhistory of worsening cough and wheezing. She has been treated for bronchitis multiple times in this time frame with antibiotics and prednisone bursts without improvement. She developed some shortness of breath yesterday with her episode of fever myalgias.  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  ED Course:  Vitals: Tmax of , pulse of 110-120s, RR in mid-20s, BP of 140-150s/99-100s, SpO2 of 98% on room air Labs: Potassium of 3.3, lactic acid 2.4 -> 2.3, WBC 12 Imaging: Chest x-ray without infiltrates.  CT PE with evidence of peribronchovascular nodularity, lymphadenopathy concerning for  sarcoidosis Medications/Course: Tylenol 1000 mg, Vancomycin, Cefepime, 1.6 L NS bolus        Hospital Problems/ Course:  SIRS Criteria met on admission with tachycardia, tachypnea, fever and leukocytosis. Possible pulmonary source, however, no active disease found.  No other symptoms. CXR negative and blood and urine cultures are no growth.  COVID-19 testing is negative. - patient feeling better and fever resolving - f/u cultures - PC and lactic wnl now - Continue Vancomycin/Cefepime - transfer to floor bed  Lymphadenopathy: in chest, significant LAD, per radiology report favors sarcoid over lymphoma.  Have d/w IR, they recommend CT abd to complete imaging and they will see pt for biopsy of the R supraclavicular node.  - IR consult for LN biopsy - ACE level  Rheumatoid arthritis Patient is on Remicade as an outpatient.  Elevated blood pressure No history of hypertension per patient.  She does get hypertensive with prednisone use. -Hydralazine IV as needed - BP's coming down  Iron deficiency anemia Appears to have resolved.  Hb 12.9 today.   Obesity Body mass index is 39.32 kg/m.  DVT prophylaxis: Lovenox Code Status: Full code Family Communication: None Disposition: home 1-2 days,  after biopsy done and fevers better  Consults called: IR Dr WPascal LuxAdmission status: Inpatient     RKelly Splinter/ [facility] [id number]/08/2018, 12:31 PM   Recent Labs  Lab 12/30/18 0249 12/31/18 0323  NA 140 135  K 3.3* 3.3*  CL 108 104  CO2 22 21*  GLUCOSE 107* 108*  BUN 14 8  CREATININE 0.81 0.72  CALCIUM 9.1 8.8*   Recent Labs  Lab 12/30/18 0249  AST 32  ALT 29  ALKPHOS 86  BILITOT 0.3  PROT 7.9  ALBUMIN 3.4*   Recent Labs  Lab 12/30/18 0249 12/31/18 0323  WBC 12.0* 8.6  NEUTROABS 10.3*  --   HGB 13.5 12.9  HCT 41.7 42.8  MCV 89.9 90.3  PLT 344 333   Iron/TIBC/Ferritin/ %Sat No results found for: IRON, TIBC, FERRITIN, IRONPCTSAT

## 2018-12-31 NOTE — Progress Notes (Signed)
Pharmacy Brief Note - Post-IR Anticoagulation Assessment:  Patient was on enoxaparin 40 mg subQ daily for DVT ppx. Last dose 5/31 @ 1311  Pt underwent US guided right supraclavicular lymph node biopsy today by IR.   Bleed risk: Standard Time to resume LMWH ppx: Day +1 (next AM)  Will resume enoxaparin 40 mg subQ daily tomorrow morning @ 1000.  Pharmacy to sign off.  Lenis Noon, PharmD 12/31/18 4:36 PM

## 2018-12-31 NOTE — Consult Note (Signed)
Chief Complaint: Patient was seen in consultation today for US guided right supraclavicular lymph node biopsy Chief Complaint  Patient presents with  . Cough  . Shortness of Breath    Referring Physician(s): Schertz,R   Supervising Physician: Sandi Mariscal  Patient Status: [facility] - In-pt  History of Present Illness: Anna Lucero is a 43 y.o. female , non-smoker, with history of rheumatoid arthritis, polycystic ovarian syndrome, GERD, depression/anxiety, obesity who was admitted to [facility] yesterday with chills, cough, dyspnea, wheezing, chest discomfort.  She was COVID-19 negative, troponin negative. Blood/urine cultures negative to date.  Subsequent imaging revealed no PE, peribronchovascular nodularity with thickening of the peribronchovascular interstitium in the lungs bilaterally and associated thoracic lymphadenopathy concerning for sarcoidosis versus lymphoma.  She has no prior history of cancer.  Request now received for ultrasound-guided right supraclavicular lymph node biopsy for further evaluation.  Past Medical History:  Diagnosis Date  . Anemia   . Anxiety   . Bronchitis   . Depression   . GERD (gastroesophageal reflux disease)   . Gestational diabetes   . Iritis   . PCOS (polycystic ovarian syndrome)   . RA (rheumatoid arthritis) ([place])     Past Surgical History:  Procedure Laterality Date  . abdominal plasty    . BREAST REDUCTION SURGERY    . DILATION AND CURETTAGE OF UTERUS    . DILATION AND EVACUATION N/A 08/09/2017   Procedure: DILATATION AND EVACUATION;  Surgeon: Allyn Kenner, DO;  Location: [facility];  Service: Gynecology;  Laterality: N/A;  . filopian tube removal    . REDUCTION MAMMAPLASTY Bilateral     Allergies: Lamictal [lamotrigine]; Celebrex [celecoxib]; and Spironolactone  Medications: Prior to Admission medications   Medication Sig Start Date End Date Taking? Authorizing Provider  acetaminophen (TYLENOL) 500 MG tablet Take  1,000 mg by mouth every 6 (six) hours as needed for moderate pain.   Yes [provider]  Albuterol Sulfate (PROAIR RESPICLICK) 629 (90 Base) MCG/ACT AEPB Inhale 2 puffs into the lungs every 6 (six) hours as needed (shortness of breath).  11/30/18  Yes [provider]  ARIPiprazole (ABILIFY) 5 MG tablet Take 1 tablet (5 mg total) by mouth daily. 10/25/18 10/25/19 Yes Arfeen, Arlyce Harman, MD  beclomethasone (QVAR) 40 MCG/ACT inhaler Inhale 1 puff into the lungs 2 (two) times daily.   Yes [provider]  cyclobenzaprine (FLEXERIL) 10 MG tablet Take 10 mg by mouth 3 (three) times daily as needed for muscle spasms.  09/04/18  Yes [provider]  diclofenac (VOLTAREN) 75 MG EC tablet Take 150 mg by mouth 2 (two) times a day. 12/07/18  Yes [provider]  doxepin (SINEQUAN) 25 MG capsule Take 1 capsule (25 mg total) by mouth at bedtime as needed (insomnia). 10/25/18  Yes Arfeen, Arlyce Harman, MD  fexofenadine (ALLEGRA) 180 MG tablet Take 180 mg by mouth daily. 11/30/18  Yes [provider]  fluticasone (FLONASE) 50 MCG/ACT nasal spray Place 1 spray into the nose 2 (two) times a day. 11/30/18 11/30/19 Yes [provider]  folic acid (FOLVITE) 1 MG tablet Take 1 mg by mouth daily.   Yes [provider]  inFLIXimab (REMICADE IV) Inject into the vein every 6 (six) weeks.    Yes [provider]  methotrexate (RHEUMATREX) 2.5 MG tablet Take 20 mg by mouth every Saturday. 12/07/18  Yes [provider]  montelukast (SINGULAIR) 10 MG tablet Take 10 mg by mouth daily.   Yes [provider]  Multiple Vitamins-Minerals (EMERGEN-C IMMUNE) PACK Take 1 Package by mouth 3 (three) times a week.   Yes [provider]  Multiple Vitamins-Minerals (MULTIVITAMIN GUMMIES ADULT) CHEW Chew 2 each by mouth daily.   Yes [provider]  omeprazole (PRILOSEC) 40 MG capsule Take 40 mg by mouth daily.   Yes [provider]  predniSONE  (STERAPRED UNI-PAK 21 TAB) 10 MG (21) TBPK tablet Take 10-60 mg by mouth See admin instructions. Taper as directed on pack for 6 days Take 60 mg on day 1, 50 mg on day 2, 40 mg on day 3, 30 mg on day 4, 20 mg on day 5 and 10 mg on day 6   Yes [provider]  citalopram (CELEXA) 40 MG tablet Take 1 tablet (40 mg total) by mouth daily. Patient not taking: Reported on 12/30/2018 10/25/18 10/25/19  Kathlee Nations, MD     Family History  Problem Relation Age of Onset  . Alcohol abuse Mother   . Drug abuse Mother   . Bipolar disorder Mother   . Anxiety disorder Mother   . Depression Mother   . Alcohol abuse Father   . Drug abuse Father   . Anxiety disorder Sister   . Depression Sister   . Breast cancer Neg Hx     Social History   Socioeconomic History  . Marital status: Married    Spouse name: Not on file  . Number of children: 3  . Years of education: Not on file  . Highest education level: Some college, no degree  Occupational History    Comment: full time  Social Needs  . Financial resource strain: Not hard at all  . Food insecurity:    Worry: Never true    Inability: Never true  . Transportation needs:    Medical: No    Non-medical: No  Tobacco Use  . Smoking status: Never Smoker  . Smokeless tobacco: Never Used  Substance and Sexual Activity  . Alcohol use: Yes    Alcohol/week: 3.0 standard drinks    Types: 3 Glasses of wine per week    Comment: 3x a week  . Drug use: No  . Sexual activity: Yes    Birth control/protection: I.U.D.  Lifestyle  . Physical activity:    Days per week: 0 days    Minutes per session: 0 min  . Stress: Very much  Relationships  . Social connections:    Talks on phone: Not on file    Gets together: Not on file    Attends religious service: More than 4 times per year    Active member of club or organization: No    Attends meetings of clubs or organizations: Never    Relationship status: Married  Other Topics Concern  . Not on  file  Social History Narrative  . Not on file     Review of Systems she currently denies fever, headache, chest pain, dyspnea, cough, abdominal/back pain, nausea, vomiting or bleeding.  She is anxious.  Vital Signs: BP 124/71   Pulse (!) 108   Temp 98.4 F (36.9 C) (Oral)   Resp 20   Ht 5\' 2"  (1.575 m)   Wt 222 lb 3.6 oz (100.8 kg)   LMP 12/16/2018   SpO2 94%   BMI 40.65 kg/m   Physical Exam awake, alert.  Chest clear to auscultation bilaterally.  Heart with slightly tachycardic but regular rhythm.  Abdomen obese, soft, positive bowel sounds, nontender.  Extremities with full  range of motion.  Imaging: Dg Chest 2 View  Result Date: 12/30/2018 CLINICAL DATA:  Cough and shortness of breath for 3 months. Rheumatoid arthritis. EXAM: CHEST - 2 VIEW COMPARISON:  None. FINDINGS: Heart size is normal. Bilateral hilar lymphadenopathy is seen. Mild tracheal deviation to the left is suspicious for right paratracheal lymphadenopathy. Both lungs are clear. No evidence of pleural effusion. IMPRESSION: No active lung disease. Bilateral hilar lymphadenopathy and suspected right paratracheal lymphadenopathy. Consider chest CT with contrast for further evaluation. Electronically Signed   By: Earle Gell M.D.   On: 12/30/2018 01:59   Ct Angio Chest Pe W And/or Wo Contrast  Result Date: 12/30/2018 CLINICAL DATA:  Cough, shortness of breath EXAM: CT ANGIOGRAPHY CHEST WITH CONTRAST TECHNIQUE: Multidetector CT imaging of the chest was performed using the standard protocol during bolus administration of intravenous contrast. Multiplanar CT image reconstructions and MIPs were obtained to evaluate the vascular anatomy. CONTRAST:  164mL OMNIPAQUE IOHEXOL 350 MG/ML SOLN COMPARISON:  Chest radiographs dated 12/30/2018 FINDINGS: Cardiovascular: Evaluation is mildly constrained by respiratory motion. Satisfactory opacification the bilateral pulmonary arteries to the segmental level. No evidence of pulmonary embolism.  Heart is normal in size.  No pericardial effusion. No evidence thoracic aortic aneurysm or dissection. Mediastinum/Nodes: Extensive thoracic lymphadenopathy, including: --1.4 cm short axis right supraclavicular node (series 4/image 3) --2.2 cm short axis high right paratracheal node (series 4/image 12) --2.1 cm short axis prevascular node (series 4/image 21) --2.7 cm short axis right hilar node (series 4/image 30) --2.4 cm short axis subcarinal node (series 4/image 32) --2.2 cm short axis left infrahilar node (series 4/image 35) This appearance is nonspecific, with sarcoidosis and lymphoma considered most likely. No suspicious axillary lymphadenopathy. Visualized thyroid is unremarkable. Lungs/Pleura: Evaluation of the lung parenchyma is constrained by respiratory motion. Thickening of the peribronchovascular interstitium with peribronchovascular nodularity in the lungs bilaterally, upper lobe predominant (for example, series 8/image 41). Dominant nodule measures 10 mm in the central right upper lobe (series 8/image 30). This appearance favors sarcoidosis. No focal consolidation. No pleural effusion or pneumothorax. Upper Abdomen: Visualized upper abdomen is grossly unremarkable. Spleen is normal in size. Musculoskeletal: Visualized osseous structures are within normal limits. Review of the MIP images confirms the above findings. IMPRESSION: No evidence of pulmonary embolism. Peribronchovascular nodularity with thickening of the peribronchovascular interstitium in the lungs bilaterally. Associated thoracic lymphadenopathy, as described above. This combination of findings favors sarcoidosis. If tissue diagnosis is required, consider excision/sampling of the right supraclavicular node. Lymphoma is considered possible but considered less likely given the overall constellation of findings. Electronically Signed   By: Julian Hy M.D.   On: 12/30/2018 06:44    Labs:  CBC: Recent Labs    03/11/18 1827  12/30/18 0249 12/31/18 0323  WBC 10.2 12.0* 8.6  HGB 8.6* 13.5 12.9  HCT 28.3* 41.7 42.8  PLT 301 344 333    COAGS: No results for input(s): INR, APTT in the last 8760 hours.  BMP: Recent Labs    12/30/18 0249 12/31/18 0323  NA 140 135  K 3.3* 3.3*  CL 108 104  CO2 22 21*  GLUCOSE 107* 108*  BUN 14 8  CALCIUM 9.1 8.8*  CREATININE 0.81 0.72  GFRNONAA >60 >60  GFRAA >60 >60    LIVER FUNCTION TESTS: Recent Labs    12/30/18 0249  BILITOT 0.3  AST 32  ALT 29  ALKPHOS 86  PROT 7.9  ALBUMIN 3.4*    TUMOR MARKERS: No results for input(s): AFPTM, CEA,  CA199, CHROMGRNA in the last 8760 hours.  Assessment and Plan: 43 y.o. female , non-smoker, with history of rheumatoid arthritis, polycystic ovarian syndrome, GERD, depression/anxiety, obesity who was admitted to [facility] yesterday with chills, cough, dyspnea, wheezing, chest discomfort.  She was COVID-19 negative, troponin negative. CBC/creat nl. Blood/urine cultures negative to date.  Subsequent imaging revealed no PE, peribronchovascular nodularity with thickening of the peribronchovascular interstitium in the lungs bilaterally and associated thoracic lymphadenopathy concerning for sarcoidosis versus lymphoma.  She has no prior history of cancer.  Request now received for ultrasound-guided right supraclavicular lymph node biopsy for further evaluation.  Imaging studies have been reviewed by Dr. Pascal Lux.Risks and benefits of procedure was discussed with the patient  including, but not limited to bleeding, infection, damage to adjacent structures or low yield requiring additional tests.  All of the questions were answered and there is agreement to proceed.  Consent signed and in chart.  Procedure scheduled for this afternoon   Thank you for this interesting consult.  I greatly enjoyed meeting Anna Lucero and look forward to participating in their care.  A copy of this report was sent to the requesting provider  on this date.  Electronically Signed: D. Rowe Robert, PA-C 12/31/2018, 1:04 PM   I spent a total of 25 minutes  in face to face in clinical consultation, greater than 50% of which was counseling/coordinating care for ultrasound-guided right supraclavicular lymph node biopsy

## 2019-01-01 LAB — BASIC METABOLIC PANEL
Anion gap: 7 (ref 5–15)
BUN: 9 mg/dL (ref 6–20)
CO2: 25 mmol/L (ref 22–32)
Calcium: 8.7 mg/dL — ABNORMAL LOW (ref 8.9–10.3)
Chloride: 105 mmol/L (ref 98–111)
Creatinine, Ser: 0.77 mg/dL (ref 0.44–1.00)
GFR calc Af Amer: >60 mL/min (ref >60)
GFR calc non Af Amer: >60 mL/min (ref >60)
Glucose, Bld: 131 mg/dL — ABNORMAL HIGH (ref 70–99)
Potassium: 3.3 mmol/L — ABNORMAL LOW (ref 3.5–5.1)
Sodium: 137 mmol/L (ref 135–145)

## 2019-01-01 LAB — CBC
HCT: 41 % (ref 36.0–46.0)
Hemoglobin: 12.6 g/dL (ref 12.0–15.0)
MCH: 28.1 pg (ref 26.0–34.0)
MCHC: 30.7 g/dL (ref 30.0–36.0)
MCV: 91.3 fL (ref 80.0–100.0)
Platelets: 309 10*3/uL (ref 150–400)
RBC: 4.49 MIL/uL (ref 3.87–5.11)
RDW: 13.4 % (ref 11.5–15.5)
WBC: 8.2 10*3/uL (ref 4.0–10.5)
nRBC: 0 % (ref 0.0–0.2)

## 2019-01-01 LAB — TSH: TSH: 1.391 u[IU]/mL (ref 0.350–4.500)

## 2019-01-01 MED ORDER — SODIUM CHLORIDE 0.9 % IV SOLN: INTRAVENOUS | Status: DC

## 2019-01-01 MED ORDER — POTASSIUM CHLORIDE CRYS ER 20 MEQ PO TBCR
40.0000 meq | EXTENDED_RELEASE_TABLET | Freq: Once | ORAL | Status: AC
  Administered 2019-01-01: 40 meq via ORAL
  Filled 2019-01-01: qty 2

## 2019-01-01 MED ORDER — ALBUTEROL SULFATE (2.5 MG/3ML) 0.083% IN NEBU: 2.5000 mg | INHALATION_SOLUTION | RESPIRATORY_TRACT | Status: DC | PRN

## 2019-01-01 MED ORDER — SODIUM CHLORIDE 0.9 % IV SOLN
INTRAVENOUS | Status: DC
  Administered 2019-01-01 – 2019-01-02 (×2): via INTRAVENOUS

## 2019-01-01 MED ORDER — IPRATROPIUM BROMIDE 0.02 % IN SOLN
0.5000 mg | Freq: Four times a day (QID) | RESPIRATORY_TRACT | Status: DC
  Administered 2019-01-01 (×2): 0.5 mg via RESPIRATORY_TRACT
  Filled 2019-01-01 (×2): qty 2.5

## 2019-01-01 MED ORDER — IPRATROPIUM-ALBUTEROL 0.5-2.5 (3) MG/3ML IN SOLN
3.0000 mL | Freq: Four times a day (QID) | RESPIRATORY_TRACT | Status: DC
  Administered 2019-01-02: 3 mL via RESPIRATORY_TRACT
  Filled 2019-01-01 (×2): qty 3

## 2019-01-01 NOTE — Progress Notes (Signed)
PROGRESS NOTE    Anna Lucero  VPX:[id number] DOB: [birth year] DOA: 12/30/2018 PCP: Precious Gilding, PA   Brief Narrative: Patient is a 43 year old female with history of GERD, obesity, rheumatoid arthritis who presented with fever, myalgia.  She reported 84-month history of worsening cough and wheezing.  She gives report that she was treated for bronchitis multiple times.  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  When she presented she was febrile, tachycardic.  Chest x-ray did not show any pneumonia.  CT chest showed evidence of peribronchovascular nodularity, lymphadenopathy concerning for sarcoidosis.  She was started on broad-spectrum antibiotics.  She underwent right cervical lymph node biopsy by IR on 12/31/18.  Assessment & Plan:   Principal Problem:   SIRS (systemic inflammatory response syndrome) (HCC) Active Problems:   Gastro-esophageal reflux disease with esophagitis   Rheumatoid arthritis with positive rheumatoid factor (HCC)   Obesity (BMI 30-39.9)   Lymphadenopathy, thoracic   SIRS: She presented with tachycardia, tachypnea, fever, leukocytosis.  Chest x-ray did not show any pneumonia.  COVID-19  test negative.  Overall status is improved.  She still has mild grade fever today.  Tachycardic today.  Will start on some IV fluids.  Continue broad-spectrum antibiotics for today.  Cultures pending.  Lymphadenopathy:CT showed Peribronchovascular nodularity with thickening of the peribronchovascular interstitium in the lungs bilaterally. Associated thoracic lymphadenopathy.R/O sarcoidosis.  Underwent right cervical lymph node biopsy.  CT did not show any abdominal pelvic lymphadenopathy. We will check ACE level .  Rheumatoid arthritis: Follows with rheumatology.  On Remicade as an outpatient  Hypertension: Blood pressure stable this morning.  Mildly hypertensive.  Continue PRN meds  History of iron deficiency anemia: Hemoglobin stable at 12.9.  Sinus tachycardia: Most likely  associated with fever.  Continue gentle IV fluids.Will check TSH.  Obesity: BMI of 39.3        DVT prophylaxis: Lovenox Code Status: Full Family Communication: None present at the bedside Disposition Plan: Home tomorrow   Consultants: None  Procedures: Lymph node biopsy  Antimicrobials:  Anti-infectives (From admission, onward)   Start     Dose/Rate Route Frequency Ordered Stop   12/30/18 1700  vancomycin (VANCOCIN) IVPB 750 mg/150 ml premix     750 mg 150 mL/hr over 60 Minutes Intravenous Every 12 hours 12/30/18 1202     12/30/18 1230  ceFEPIme (MAXIPIME) 2 g in sodium chloride 0.9 % 100 mL IVPB     2 g 200 mL/hr over 30 Minutes Intravenous Every 8 hours 12/30/18 1204     12/30/18 1215  ceFEPIme (MAXIPIME) 2 g in sodium chloride 0.9 % 100 mL IVPB  Status:  Discontinued     2 g 200 mL/hr over 30 Minutes Intravenous Every 8 hours 12/30/18 1202 12/30/18 1204   12/30/18 0345  vancomycin (VANCOCIN) 2,000 mg in sodium chloride 0.9 % 500 mL IVPB     2,000 mg 250 mL/hr over 120 Minutes Intravenous  Once 12/30/18 0311 12/30/18 0738   12/30/18 0315  azithromycin (ZITHROMAX) 500 mg in sodium chloride 0.9 % 250 mL IVPB     500 mg 250 mL/hr over 60 Minutes Intravenous  Once 12/30/18 0303 12/30/18 0532   12/30/18 0315  ceFEPIme (MAXIPIME) 2 g in sodium chloride 0.9 % 100 mL IVPB     2 g 200 mL/hr over 30 Minutes Intravenous NOW 12/30/18 0309 12/30/18 0422      Subjective: Patient seen and examined the bedside this morning.  Mild grade fever persists.  Tachycardic.  Denies any  chest pain or shortness of breath.  Objective: Vitals:   12/31/18 1828 12/31/18 2054 01/01/19 0410 01/01/19 1319  BP: (!) 124/96 109/83 120/63 (!) 136/107  Pulse: (!) 113 95 (!) 101 (!) 131  Resp: 20 16 20  (!) 22  Temp: 99.5 F (37.5 C) 98.9 F (37.2 C) 99.3 F (37.4 C) 99.5 F (37.5 C)  TempSrc: Oral Oral  Oral  SpO2: 96% 96% 92% 95%  Weight:      Height:        Intake/Output Summary (Last 24  hours) at 01/01/2019 1328 Last data filed at 01/01/2019 0615 Gross per 24 hour  Intake 836.98 ml  Output 300 ml  Net 536.98 ml   Filed Weights   12/30/18 0133 12/30/18 1200  Weight: 97.5 kg 100.8 kg    Examination:  General exam: Appears calm and comfortable ,Not in distress,obese HEENT:PERRL,Oral mucosa moist, Ear/Nose normal on gross exam Respiratory system: Mild expiratory wheezes bilaterally. Cardiovascular system: Sinus tachycardia, No JVD, murmurs, rubs, gallops or clicks. No pedal edema. Gastrointestinal system: Abdomen is nondistended, soft and nontender. No organomegaly or masses felt. Normal bowel sounds heard. Central nervous system: Alert and oriented. No focal neurological deficits. Extremities: No edema, no clubbing ,no cyanosis, distal peripheral pulses palpable. Skin: No rashes, lesions or ulcers,no icterus ,no pallor MSK: Normal muscle bulk,tone ,power Psychiatry: Judgement and insight appear normal. Mood & affect appropriate.     Data Reviewed: I have personally reviewed following labs and imaging studies  CBC: Recent Labs  Lab 12/30/18 0249 12/31/18 0323 01/01/19 0549  WBC 12.0* 8.6 8.2  NEUTROABS 10.3*  --   --   HGB 13.5 12.9 12.6  HCT 41.7 42.8 41.0  MCV 89.9 90.3 91.3  PLT 344 333 315   Basic Metabolic Panel: Recent Labs  Lab 12/30/18 0249 12/31/18 0323 01/01/19 0549  NA 140 135 137  K 3.3* 3.3* 3.3*  CL 108 104 105  CO2 22 21* 25  GLUCOSE 107* 108* 131*  BUN 14 8 9   CREATININE 0.81 0.72 0.77  CALCIUM 9.1 8.8* 8.7*   GFR: Estimated Creatinine Clearance: 100.8 mL/min (by C-G formula based on SCr of 0.77 mg/dL). Liver Function Tests: Recent Labs  Lab 12/30/18 0249  AST 32  ALT 29  ALKPHOS 86  BILITOT 0.3  PROT 7.9  ALBUMIN 3.4*   No results for input(s): LIPASE, AMYLASE in the last 168 hours. No results for input(s): AMMONIA in the last 168 hours. Coagulation Profile: Recent Labs  Lab 12/31/18 1229  INR 1.0   Cardiac  Enzymes: Recent Labs  Lab 12/30/18 0302  TROPONINI <0.03   BNP (last 3 results) No results for input(s): PROBNP in the last 8760 hours. HbA1C: No results for input(s): HGBA1C in the last 72 hours. CBG: Recent Labs  Lab 12/30/18 1759  GLUCAP 132*   Lipid Profile: No results for input(s): CHOL, HDL, LDLCALC, TRIG, CHOLHDL, LDLDIRECT in the last 72 hours. Thyroid Function Tests: No results for input(s): TSH, T4TOTAL, FREET4, T3FREE, THYROIDAB in the last 72 hours. Anemia Panel: No results for input(s): VITAMINB12, FOLATE, FERRITIN, TIBC, IRON, RETICCTPCT in the last 72 hours. Sepsis Labs: Recent Labs  Lab 12/30/18 0249 12/30/18 1761 12/30/18 0841 12/31/18 0323  PROCALCITON  --  <0.10  --  <0.10  LATICACIDVEN 2.3*   2.4*  --  1.7  --     Recent Results (from the past 240 hour(s))  SARS Coronavirus 2 (CEPHEID- Performed in [facility] hospital lab), [facility]  Status: None   Collection Time: 12/30/18  3:03 AM  Result Value Ref Range Status   SARS Coronavirus 2 NEGATIVE NEGATIVE Final    Comment: (NOTE) If result is NEGATIVE SARS-CoV-2 target nucleic acids are NOT DETECTED. The SARS-CoV-2 RNA is generally detectable in upper and lower  respiratory specimens during the acute phase of infection. The lowest  concentration of SARS-CoV-2 viral copies this assay can detect is 250  copies / mL. A negative result does not preclude SARS-CoV-2 infection  and should not be used as the sole basis for treatment or other  patient management decisions.  A negative result may occur with  improper specimen collection / handling, submission of specimen other  than nasopharyngeal swab, presence of viral mutation(s) within the  areas targeted by this assay, and inadequate number of viral copies  (<250 copies / mL). A negative result must be combined with clinical  observations, patient history, and epidemiological information. If result is POSITIVE SARS-CoV-2 target nucleic acids are  DETECTED. The SARS-CoV-2 RNA is generally detectable in upper and lower  respiratory specimens dur ing the acute phase of infection.  Positive  results are indicative of active infection with SARS-CoV-2.  Clinical  correlation with patient history and other diagnostic information is  necessary to determine patient infection status.  Positive results do  not rule out bacterial infection or co-infection with other viruses. If result is PRESUMPTIVE POSTIVE SARS-CoV-2 nucleic acids MAY BE PRESENT.   A presumptive positive result was obtained on the submitted specimen  and confirmed on repeat testing.  While 2019 novel coronavirus  (SARS-CoV-2) nucleic acids may be present in the submitted sample  additional confirmatory testing may be necessary for epidemiological  and / or clinical management purposes  to differentiate between  SARS-CoV-2 and other Sarbecovirus currently known to infect humans.  If clinically indicated additional testing with an alternate test  methodology [phone]) is advised. The SARS-CoV-2 RNA is generally  detectable in upper and lower respiratory sp ecimens during the acute  phase of infection. The expected result is Negative. Fact Sheet for Patients:  StrictlyIdeas.no Fact Sheet for Healthcare Providers: BankingDealers.co.za This test is not yet approved or cleared by the [place] FDA and has been authorized for detection and/or diagnosis of SARS-CoV-2 by FDA under an Emergency Use Authorization (EUA).  This EUA will remain in effect (meaning this test can be used) for the duration of the COVID-19 declaration under Section 564(b)(1) of the Act, 21 U.S.C. section 360bbb-3(b)(1), unless the authorization is terminated or revoked sooner. Performed at [facility], [place] [address] [place], [place] [address]   Blood Culture (routine x 2)     Status: None (Preliminary result)   Collection Time:  12/30/18  3:24 AM  Result Value Ref Range Status   Specimen Description   Final    BLOOD RIGHT ANTECUBITAL Performed at [facility] [address] [place], [place] [address]    Special Requests   Final    BOTTLES DRAWN AEROBIC AND ANAEROBIC Blood Culture adequate volume Performed at [place] [address], [place], [place] [address]    Culture   Final    NO GROWTH 2 DAYS Performed at [facility] [address], [place], [place] [address]    Report Status PENDING  Incomplete  Blood Culture (routine x 2)     Status: None (Preliminary result)   Collection Time: 12/30/18  3:24 AM  Result Value Ref Range Status  Specimen Description   Final    BLOOD LEFT HAND Performed at [facility] [place] [address] [place], [place] [address]    Special Requests   Final    BOTTLES DRAWN AEROBIC AND ANAEROBIC Blood Culture adequate volume Performed at [place] [address], [place], [place] [address]    Culture   Final    NO GROWTH 2 DAYS Performed at [facility] [address], [place], [place] [address]    Report Status PENDING  Incomplete  Culture, Urine     Status: None   Collection Time: 12/30/18  9:24 AM  Result Value Ref Range Status   Specimen Description   Final    URINE, RANDOM Performed at [facility] [address], [place], [place] [address]    Special Requests   Final    NONE Performed at [facility], [place] [address] [place], [place] [address]    Culture   Final    NO GROWTH Performed at [facility], [place] [address], [place], [place] [address]    Report Status 12/31/2018 FINAL  Final  MRSA PCR Screening     Status: None   Collection Time: 12/30/18  1:00 PM  Result Value Ref Range Status   MRSA by PCR NEGATIVE NEGATIVE Final    Comment:        The GeneXpert MRSA Assay (FDA approved for NASAL specimens only), is one  component of a comprehensive MRSA colonization surveillance program. It is not intended to diagnose MRSA infection nor to guide or monitor treatment for MRSA infections. Performed at [facility], [place] [address] [place],  [address]          Radiology Studies: Ct Abdomen Pelvis W Contrast  Result Date: 12/31/2018 CLINICAL DATA:  Enlarged lymph nodes EXAM: CT ABDOMEN AND PELVIS WITH CONTRAST TECHNIQUE: Multidetector CT imaging of the abdomen and pelvis was performed using the standard protocol following bolus administration of intravenous contrast. CONTRAST:  174mL OMNIPAQUE IOHEXOL 300 MG/ML  SOLN COMPARISON:  CT chest, 12/30/2018 FINDINGS: Lower chest: Small bilateral pleural effusions and associated atelectasis or consolidation. Enlarged hilar and subcarinal lymph nodes partially imaged in the lower chest. Hepatobiliary: No focal liver abnormality is seen. No gallstones, gallbladder wall thickening, or biliary dilatation. Pancreas: Unremarkable. No pancreatic ductal dilatation or surrounding inflammatory changes. Spleen: Normal in size without focal abnormality. Adrenals/Urinary Tract: Adrenal glands are unremarkable. Kidneys are normal, without renal calculi, focal lesion, or hydronephrosis. Bladder is unremarkable. Stomach/Bowel: Stomach is within normal limits. Appendix appears normal. No evidence of bowel wall thickening, distention, or inflammatory changes. Vascular/Lymphatic: No significant vascular findings are present. No enlarged abdominal or pelvic lymph nodes. Reproductive: No mass or other abnormality. Bilateral ovarian cysts or follicles. Other: No abdominal wall hernia or abnormality. No abdominopelvic ascites. Musculoskeletal: No acute or significant osseous findings. IMPRESSION: 1.  No evidence of abdominal or pelvic lymphadenopathy. 2. Small bilateral pleural effusions and associated atelectasis or consolidation. Enlarged hilar and subcarinal lymph nodes  partially imaged in the lower chest, as seen on prior examination of the chest. Electronically Signed   By: Eddie Candle M.D.   On: 12/31/2018 13:32   [place] Core Biopsy (lymph Nodes)  Result Date: 12/31/2018 INDICATION: No known primary, now with mediastinal, hilar and right supraclavicular ink adenopathy worrisome for either sarcoid versus lymphoma. Please perform ultrasound-guided biopsy for tissue diagnostic purposes. EXAM: ULTRASOUND-GUIDED RIGHT SUPRACLAVICULAR LYMPH NODE BIOPSY COMPARISON:  Chest  CT - 12/30/2018; CT of the abdomen and pelvis-12/31/2018 MEDICATIONS: None ANESTHESIA/SEDATION: Moderate (conscious) sedation was employed during this procedure. A total of Versed 2 mg and Fentanyl 100 mcg was administered intravenously. Moderate Sedation Time: 10 minutes. The patient's level of consciousness and vital signs were monitored continuously by radiology nursing throughout the procedure under my direct supervision. COMPLICATIONS: None immediate. TECHNIQUE: Informed written consent was obtained from the patient after a discussion of the risks, benefits and alternatives to treatment. Questions regarding the procedure were encouraged and answered. Initial ultrasound scanning demonstrated an approximately 1.5 x 1.0 cm right supraclavicular lymph node correlating with the dominant lymph node seen on preceding chest CT image 3, series 4). An ultrasound image was saved for documentation purposes. The procedure was planned. A timeout was performed prior to the initiation of the procedure. The operative was prepped and draped in the usual sterile fashion, and a sterile drape was applied covering the operative field. A timeout was performed prior to the initiation of the procedure. Local anesthesia was provided with 1% lidocaine with epinephrine. Under direct ultrasound guidance, an 18 gauge core needle device was utilized to obtain to obtain 4 core needle biopsies of the dominant right supraclavicular lymph node.  The samples were placed in saline and submitted to pathology. The needle was removed and hemostasis was achieved with manual compression. Post procedure scan was negative for significant hematoma. A dressing was placed. The patient tolerated the procedure well without immediate postprocedural complication. IMPRESSION: Technically successful ultrasound guided biopsy of dominant right supraclavicular lymph node. Note, procedure somewhat challenging due to patient body habitus as well as significant respiratory excursion. If additional tissue is required for diagnosis, further evaluation with PET-CT and/or bronchoscopy could be performed as indicated. Electronically Signed   By: Sandi Mariscal M.D.   On: 12/31/2018 17:08        Scheduled Meds:  amLODipine  10 mg Oral Daily   ARIPiprazole  5 mg Oral Daily   citalopram  40 mg Oral Daily   enoxaparin (LOVENOX) injection  40 mg Subcutaneous Q24H   pantoprazole  40 mg Oral Daily   Continuous Infusions:  sodium chloride     ceFEPime (MAXIPIME) IV 2 g (01/01/19 1149)   vancomycin 750 mg (01/01/19 0511)     LOS: 2 days    Time spent: 35 mins.More than 50% of that time was spent in counseling and/or coordination of care.      Shelly Coss, MD [facility] Pager [phone]  If 7PM-7AM, please contact night-coverage [email or web address] Password [email or web address] 01/01/2019, 1:28 PM

## 2019-01-02 LAB — BASIC METABOLIC PANEL
Anion gap: 5 (ref 5–15)
BUN: 10 mg/dL (ref 6–20)
CO2: 24 mmol/L (ref 22–32)
Calcium: 8.9 mg/dL (ref 8.9–10.3)
Chloride: 109 mmol/L (ref 98–111)
Creatinine, Ser: 0.81 mg/dL (ref 0.44–1.00)
GFR calc Af Amer: >60 mL/min (ref >60)
GFR calc non Af Amer: >60 mL/min (ref >60)
Glucose, Bld: 155 mg/dL — ABNORMAL HIGH (ref 70–99)
Potassium: 3.9 mmol/L (ref 3.5–5.1)
Sodium: 138 mmol/L (ref 135–145)

## 2019-01-02 LAB — ANGIOTENSIN CONVERTING ENZYME: Angiotensin-Converting Enzyme: 56 U/L (ref 14–82)

## 2019-01-02 MED ORDER — AMLODIPINE BESYLATE 5 MG PO TABS: 5.0000 mg | ORAL_TABLET | Freq: Every day | ORAL | 0 refills | Status: DC

## 2019-01-02 MED ORDER — AMOXICILLIN-POT CLAVULANATE 875-125 MG PO TABS: 1.0000 | ORAL_TABLET | Freq: Two times a day (BID) | ORAL | 0 refills | Status: AC

## 2019-01-02 NOTE — Discharge Summary (Signed)
Physician Discharge Summary  ZENORA KARPEL DGL:[id number] DOB: [birth year] DOA: 12/30/2018  PCP: Precious Gilding, PA  Admit date: 12/30/2018 Discharge date: 01/02/2019  Admitted From: Home Disposition:  Home  Discharge Condition:Stable CODE STATUS:FULL Diet recommendation: Heart Healthy   Brief/Interim Summary: Patient is a 43 year old female with history of GERD, obesity, rheumatoid arthritis who presented with fever, myalgia.  She reported 41-month history of worsening cough and wheezing.  She gives report that she was treated for bronchitis multiple times.  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  When she presented she was febrile, tachycardic.  Chest x-ray did not show any pneumonia.  CT chest showed evidence of peribronchovascular nodularity, lymphadenopathy concerning for sarcoidosis.  She was started on broad-spectrum antibiotics.  She underwent right cervical lymph node biopsy by IR on 12/31/18.Biospy is still pending.  She is hemodynamically stable for discharge to home today.  Her biopsy report needs to be followed.  Following problems were addressed during hospitalization:  SIRS: She presented with tachycardia, tachypnea, fever, leukocytosis.  Chest x-ray did not show any pneumonia.  COVID-19  test negative.  Overall status has improved.  Afebrile this morning.  She was on  broad-spectrum antibiotics .Antibiotics changed to oral.  Cultures pending.  Lymphadenopathy:CT showed Peribronchovascular nodularity with thickening of the peribronchovascular interstitium in the lungs bilaterally. Associated thoracic lymphadenopathy.R/O sarcoidosis vs lymphoma.  Underwent right cervical lymph node biopsy.  CT did not show any abdominal pelvic lymphadenopathy. Pending ACE level ,biopsy report.  Rheumatoid arthritis: Follows with rheumatology.  On Remicade as an outpatient  Hypertension: Blood pressure stable this morning.  Started on amlodipine here.  History of iron deficiency anemia: Hemoglobin  stable at 12.9.  Sinus tachycardia: Most likely associated with fever.Much improve now.Normal TSH.  Obesity: BMI of 39.3    Discharge Diagnoses:  Principal Problem:   SIRS (systemic inflammatory response syndrome) (HCC) Active Problems:   Gastro-esophageal reflux disease with esophagitis   Rheumatoid arthritis with positive rheumatoid factor (HCC)   Obesity (BMI 30-39.9)   Lymphadenopathy, thoracic    Discharge Instructions  Discharge Instructions    Diet - low sodium heart healthy   Complete by:  As directed    Discharge instructions   Complete by:  As directed    1)Please take prescribed medications as instructed. 2)Follow up with your PCP in a week.   Increase activity slowly   Complete by:  As directed      Allergies as of 01/02/2019      Reactions   Lamictal [lamotrigine]    Skin rash, likely not SJS but concerning   Celebrex [celecoxib] Other (See Comments)   Chest pain   Spironolactone Other (See Comments)   Bleeding      Medication List    TAKE these medications   acetaminophen 500 MG tablet Commonly known as:  TYLENOL Take 1,000 mg by mouth every 6 (six) hours as needed for moderate pain.   amLODipine 5 MG tablet Commonly known as:  NORVASC Take 1 tablet (5 mg total) by mouth daily.   amoxicillin-clavulanate 875-125 MG tablet Commonly known as:  Augmentin Take 1 tablet by mouth 2 (two) times daily for 3 days.   ARIPiprazole 5 MG tablet Commonly known as:  ABILIFY Take 1 tablet (5 mg total) by mouth daily.   beclomethasone 40 MCG/ACT inhaler Commonly known as:  QVAR Inhale 1 puff into the lungs 2 (two) times daily.   citalopram 40 MG tablet Commonly known as:  CeleXA Take 1 tablet (40 mg  total) by mouth daily.   cyclobenzaprine 10 MG tablet Commonly known as:  FLEXERIL Take 10 mg by mouth 3 (three) times daily as needed for muscle spasms.   diclofenac 75 MG EC tablet Commonly known as:  VOLTAREN Take 150 mg by mouth 2 (two) times a  day.   doxepin 25 MG capsule Commonly known as:  SINEQUAN Take 1 capsule (25 mg total) by mouth at bedtime as needed (insomnia).   Emergen-C Immune Pack Take 1 Package by mouth 3 (three) times a week.   Multivitamin Gummies Adult Chew Chew 2 each by mouth daily.   fexofenadine 180 MG tablet Commonly known as:  ALLEGRA Take 180 mg by mouth daily.   fluticasone 50 MCG/ACT nasal spray Commonly known as:  FLONASE Place 1 spray into the nose 2 (two) times a day.   folic acid 1 MG tablet Commonly known as:  FOLVITE Take 1 mg by mouth daily.   methotrexate 2.5 MG tablet Commonly known as:  RHEUMATREX Take 20 mg by mouth every Saturday.   montelukast 10 MG tablet Commonly known as:  SINGULAIR Take 10 mg by mouth daily.   omeprazole 40 MG capsule Commonly known as:  PRILOSEC Take 40 mg by mouth daily.   predniSONE 10 MG (21) Tbpk tablet Commonly known as:  STERAPRED UNI-PAK 21 TAB Take 10-60 mg by mouth See admin instructions. Taper as directed on pack for 6 days Take 60 mg on day 1, 50 mg on day 2, 40 mg on day 3, 30 mg on day 4, 20 mg on day 5 and 10 mg on day 6   ProAir RespiClick 093 (90 Base) MCG/ACT Aepb Generic drug:  Albuterol Sulfate Inhale 2 puffs into the lungs every 6 (six) hours as needed (shortness of breath).   REMICADE IV Inject into the vein every 6 (six) weeks.      Follow-up Information    [place], [place], [place]. Schedule an appointment as soon as possible for a visit in 1 week(s).   Specialty:  Physician Assistant Contact information: [place] [address] [phone]          Allergies  Allergen Reactions  . 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 Celebrex [Celecoxib] Other (See Comments)    Chest pain  . Spironolactone Other (See Comments)    Bleeding    Consultations:  None   Procedures/Studies: Dg Chest 2 View  Result Date: 12/30/2018 CLINICAL DATA:  Cough and shortness of  breath for 3 months. Rheumatoid arthritis. EXAM: CHEST - 2 VIEW COMPARISON:  None. FINDINGS: Heart size is normal. Bilateral hilar lymphadenopathy is seen. Mild tracheal deviation to the left is suspicious for right paratracheal lymphadenopathy. Both lungs are clear. No evidence of pleural effusion. IMPRESSION: No active lung disease. Bilateral hilar lymphadenopathy and suspected right paratracheal lymphadenopathy. Consider chest CT with contrast for further evaluation. Electronically Signed   By: Earle Gell M.D.   On: 12/30/2018 01:59   Ct Angio Chest Pe W And/or Wo Contrast  Result Date: 12/30/2018 CLINICAL DATA:  Cough, shortness of breath EXAM: CT ANGIOGRAPHY CHEST WITH CONTRAST TECHNIQUE: Multidetector CT imaging of the chest was performed using the standard protocol during bolus administration of intravenous contrast. Multiplanar CT image reconstructions and MIPs were obtained to evaluate the vascular anatomy. CONTRAST:  196mL OMNIPAQUE IOHEXOL 350 MG/ML SOLN COMPARISON:  Chest radiographs dated 12/30/2018 FINDINGS: Cardiovascular: Evaluation is mildly constrained by respiratory motion. Satisfactory opacification the bilateral  pulmonary arteries to the segmental level. No evidence of pulmonary embolism. Heart is normal in size.  No pericardial effusion. No evidence thoracic aortic aneurysm or dissection. Mediastinum/Nodes: Extensive thoracic lymphadenopathy, including: --1.4 cm short axis right supraclavicular node (series 4/image 3) --2.2 cm short axis high right paratracheal node (series 4/image 12) --2.1 cm short axis prevascular node (series 4/image 21) --2.7 cm short axis right hilar node (series 4/image 30) --2.4 cm short axis subcarinal node (series 4/image 32) --2.2 cm short axis left infrahilar node (series 4/image 35) This appearance is nonspecific, with sarcoidosis and lymphoma considered most likely. No suspicious axillary lymphadenopathy. Visualized thyroid is unremarkable. Lungs/Pleura:  Evaluation of the lung parenchyma is constrained by respiratory motion. Thickening of the peribronchovascular interstitium with peribronchovascular nodularity in the lungs bilaterally, upper lobe predominant (for example, series 8/image 41). Dominant nodule measures 10 mm in the central right upper lobe (series 8/image 30). This appearance favors sarcoidosis. No focal consolidation. No pleural effusion or pneumothorax. Upper Abdomen: Visualized upper abdomen is grossly unremarkable. Spleen is normal in size. Musculoskeletal: Visualized osseous structures are within normal limits. Review of the MIP images confirms the above findings. IMPRESSION: No evidence of pulmonary embolism. Peribronchovascular nodularity with thickening of the peribronchovascular interstitium in the lungs bilaterally. Associated thoracic lymphadenopathy, as described above. This combination of findings favors sarcoidosis. If tissue diagnosis is required, consider excision/sampling of the right supraclavicular node. Lymphoma is considered possible but considered less likely given the overall constellation of findings. Electronically Signed   By: Julian Hy M.D.   On: 12/30/2018 06:44   Ct Abdomen Pelvis W Contrast  Result Date: 12/31/2018 CLINICAL DATA:  Enlarged lymph nodes EXAM: CT ABDOMEN AND PELVIS WITH CONTRAST TECHNIQUE: Multidetector CT imaging of the abdomen and pelvis was performed using the standard protocol following bolus administration of intravenous contrast. CONTRAST:  135mL OMNIPAQUE IOHEXOL 300 MG/ML  SOLN COMPARISON:  CT chest, 12/30/2018 FINDINGS: Lower chest: Small bilateral pleural effusions and associated atelectasis or consolidation. Enlarged hilar and subcarinal lymph nodes partially imaged in the lower chest. Hepatobiliary: No focal liver abnormality is seen. No gallstones, gallbladder wall thickening, or biliary dilatation. Pancreas: Unremarkable. No pancreatic ductal dilatation or surrounding inflammatory  changes. Spleen: Normal in size without focal abnormality. Adrenals/Urinary Tract: Adrenal glands are unremarkable. Kidneys are normal, without renal calculi, focal lesion, or hydronephrosis. Bladder is unremarkable. Stomach/Bowel: Stomach is within normal limits. Appendix appears normal. No evidence of bowel wall thickening, distention, or inflammatory changes. Vascular/Lymphatic: No significant vascular findings are present. No enlarged abdominal or pelvic lymph nodes. Reproductive: No mass or other abnormality. Bilateral ovarian cysts or follicles. Other: No abdominal wall hernia or abnormality. No abdominopelvic ascites. Musculoskeletal: No acute or significant osseous findings. IMPRESSION: 1.  No evidence of abdominal or pelvic lymphadenopathy. 2. Small bilateral pleural effusions and associated atelectasis or consolidation. Enlarged hilar and subcarinal lymph nodes partially imaged in the lower chest, as seen on prior examination of the chest. Electronically Signed   By: Eddie Candle M.D.   On: 12/31/2018 13:32   [place] Core Biopsy (lymph Nodes)  Result Date: 12/31/2018 INDICATION: No known primary, now with mediastinal, hilar and right supraclavicular ink adenopathy worrisome for either sarcoid versus lymphoma. Please perform ultrasound-guided biopsy for tissue diagnostic purposes. EXAM: ULTRASOUND-GUIDED RIGHT SUPRACLAVICULAR LYMPH NODE BIOPSY COMPARISON:  Chest CT - 12/30/2018; CT of the abdomen and pelvis-12/31/2018 MEDICATIONS: None ANESTHESIA/SEDATION: Moderate (conscious) sedation was employed during this procedure. A total of Versed 2 mg and Fentanyl 100 mcg was administered intravenously. Moderate  Sedation Time: 10 minutes. The patient's level of consciousness and vital signs were monitored continuously by radiology nursing throughout the procedure under my direct supervision. COMPLICATIONS: None immediate. TECHNIQUE: Informed written consent was obtained from the patient after a discussion of the  risks, benefits and alternatives to treatment. Questions regarding the procedure were encouraged and answered. Initial ultrasound scanning demonstrated an approximately 1.5 x 1.0 cm right supraclavicular lymph node correlating with the dominant lymph node seen on preceding chest CT image 3, series 4). An ultrasound image was saved for documentation purposes. The procedure was planned. A timeout was performed prior to the initiation of the procedure. The operative was prepped and draped in the usual sterile fashion, and a sterile drape was applied covering the operative field. A timeout was performed prior to the initiation of the procedure. Local anesthesia was provided with 1% lidocaine with epinephrine. Under direct ultrasound guidance, an 18 gauge core needle device was utilized to obtain to obtain 4 core needle biopsies of the dominant right supraclavicular lymph node. The samples were placed in saline and submitted to pathology. The needle was removed and hemostasis was achieved with manual compression. Post procedure scan was negative for significant hematoma. A dressing was placed. The patient tolerated the procedure well without immediate postprocedural complication. IMPRESSION: Technically successful ultrasound guided biopsy of dominant right supraclavicular lymph node. Note, procedure somewhat challenging due to patient body habitus as well as significant respiratory excursion. If additional tissue is required for diagnosis, further evaluation with PET-CT and/or bronchoscopy could be performed as indicated. Electronically Signed   By: Sandi Mariscal M.D.   On: 12/31/2018 17:08       Subjective: Patient seen and examined the bedside this morning.  Feels better today.  Afebrile.  Blood pressure stable.  Sinus tachycardia has improved.  Stable for discharge.  Discharge Exam: Vitals:   01/01/19 2214 01/02/19 0539  BP: 137/75 109/68  Pulse: (!) 103 (!) 105  Resp: 20 20  Temp: 98.3 F (36.8 C) 98.9  F (37.2 C)  SpO2: 95% 99%   Vitals:   01/01/19 1830 01/01/19 1950 01/01/19 2214 01/02/19 0539  BP: 117/79  137/75 109/68  Pulse: (!) 105  (!) 103 (!) 105  Resp: (!) 22  20 20   Temp: 99.4 F (37.4 C)  98.3 F (36.8 C) 98.9 F (37.2 C)  TempSrc: Oral  Oral Oral  SpO2: 92% 93% 95% 99%  Weight:      Height:        General: Pt is alert, awake, not in acute distress Cardiovascular: RRR, S1/S2 +, no rubs, no gallops Respiratory: CTA bilaterally, no wheezing, no rhonchi Abdominal: Soft, NT, ND, bowel sounds + Extremities: no edema, no cyanosis    The results of significant diagnostics from this hospitalization (including imaging, microbiology, ancillary and laboratory) are listed below for reference.     Microbiology: Recent Results (from the past 240 hour(s))  SARS Coronavirus 2 (CEPHEID- Performed in [facility] hospital lab), Hosp Order     Status: None   Collection Time: 12/30/18  3:03 AM  Result Value Ref Range Status   SARS Coronavirus 2 NEGATIVE NEGATIVE Final    Comment: (NOTE) If result is NEGATIVE SARS-CoV-2 target nucleic acids are NOT DETECTED. The SARS-CoV-2 RNA is generally detectable in upper and lower  respiratory specimens during the acute phase of infection. The lowest  concentration of SARS-CoV-2 viral copies this assay can detect is 250  copies / mL. A negative result does not preclude SARS-CoV-2  infection  and should not be used as the sole basis for treatment or other  patient management decisions.  A negative result may occur with  improper specimen collection / handling, submission of specimen other  than nasopharyngeal swab, presence of viral mutation(s) within the  areas targeted by this assay, and inadequate number of viral copies  (<250 copies / mL). A negative result must be combined with clinical  observations, patient history, and epidemiological information. If result is POSITIVE SARS-CoV-2 target nucleic acids are DETECTED. The SARS-CoV-2  RNA is generally detectable in upper and lower  respiratory specimens dur ing the acute phase of infection.  Positive  results are indicative of active infection with SARS-CoV-2.  Clinical  correlation with patient history and other diagnostic information is  necessary to determine patient infection status.  Positive results do  not rule out bacterial infection or co-infection with other viruses. If result is PRESUMPTIVE POSTIVE SARS-CoV-2 nucleic acids MAY BE PRESENT.   A presumptive positive result was obtained on the submitted specimen  and confirmed on repeat testing.  While 2019 novel coronavirus  (SARS-CoV-2) nucleic acids may be present in the submitted sample  additional confirmatory testing may be necessary for epidemiological  and / or clinical management purposes  to differentiate between  SARS-CoV-2 and other Sarbecovirus currently known to infect humans.  If clinically indicated additional testing with an alternate test  methodology [phone]) is advised. The SARS-CoV-2 RNA is generally  detectable in upper and lower respiratory sp ecimens during the acute  phase of infection. The expected result is Negative. Fact Sheet for Patients:  StrictlyIdeas.no Fact Sheet for Healthcare Providers: BankingDealers.co.za This test is not yet approved or cleared by the [place] FDA and has been authorized for detection and/or diagnosis of SARS-CoV-2 by FDA under an Emergency Use Authorization (EUA).  This EUA will remain in effect (meaning this test can be used) for the duration of the COVID-19 declaration under Section 564(b)(1) of the Act, 21 U.S.C. section 360bbb-3(b)(1), unless the authorization is terminated or revoked sooner. Performed at [facility], [place] [address], [place], [place] [address]   Blood Culture (routine x 2)     Status: None (Preliminary result)   Collection Time: 12/30/18  3:24 AM  Result  Value Ref Range Status   Specimen Description   Final    BLOOD RIGHT ANTECUBITAL Performed at [facility] [address] [place], [place] [address]    Special Requests   Final    BOTTLES DRAWN AEROBIC AND ANAEROBIC Blood Culture adequate volume Performed at [facility] [address], [place], [place] [address]    Culture   Final    NO GROWTH 3 DAYS Performed at [facility], [place] [address] [place], [place] [address]    Report Status PENDING  Incomplete  Blood Culture (routine x 2)     Status: None (Preliminary result)   Collection Time: 12/30/18  3:24 AM  Result Value Ref Range Status   Specimen Description   Final    BLOOD LEFT HAND Performed at [facility] [address] [place], [place] [address]    Special Requests   Final    BOTTLES DRAWN AEROBIC AND ANAEROBIC Blood Culture adequate volume Performed at [facility] [address] [place], [place] [address]    Culture   Final    NO GROWTH 3 DAYS Performed at [facility], [place],  [place] [address]    Report Status PENDING  Incomplete  Culture, Urine     Status: None   Collection Time: 12/30/18  9:24 AM  Result Value Ref Range Status   Specimen Description   Final    URINE, RANDOM Performed at [facility] [address] [place], [place] [address]    Special Requests   Final    NONE Performed at [facility], [place] [address], [place], [place] [address]    Culture   Final    NO GROWTH Performed at [facility], [place] [address], [place], [place] [address]    Report Status 12/31/2018 FINAL  Final  MRSA PCR Screening     Status: None   Collection Time: 12/30/18  1:00 PM  Result Value Ref Range Status   MRSA by PCR NEGATIVE NEGATIVE Final    Comment:        The GeneXpert MRSA Assay (FDA approved for NASAL specimens only), is one component of a comprehensive  MRSA colonization surveillance program. It is not intended to diagnose MRSA infection nor to guide or monitor treatment for MRSA infections. Performed at [facility], [place] [address], [place], [place] [address]      Labs: BNP (last 3 results) Recent Labs    12/30/18 0301  BNP 56.3   Basic Metabolic Panel: Recent Labs  Lab 12/30/18 0249 12/31/18 0323 01/01/19 0549 01/02/19 0556  NA 140 135 137 138  K 3.3* 3.3* 3.3* 3.9  CL 108 104 105 109  CO2 22 21* 25 24  GLUCOSE 107* 108* 131* 155*  BUN 14 8 9 10   CREATININE 0.81 0.72 0.77 0.81  CALCIUM 9.1 8.8* 8.7* 8.9   Liver Function Tests: Recent Labs  Lab 12/30/18 0249  AST 32  ALT 29  ALKPHOS 86  BILITOT 0.3  PROT 7.9  ALBUMIN 3.4*   No results for input(s): LIPASE, AMYLASE in the last 168 hours. No results for input(s): AMMONIA in the last 168 hours. CBC: Recent Labs  Lab 12/30/18 0249 12/31/18 0323 01/01/19 0549  WBC 12.0* 8.6 8.2  NEUTROABS 10.3*  --   --   HGB 13.5 12.9 12.6  HCT 41.7 42.8 41.0  MCV 89.9 90.3 91.3  PLT 344 333 309   Cardiac Enzymes: Recent Labs  Lab 12/30/18 0302  TROPONINI <0.03   BNP: Invalid input(s): POCBNP CBG: Recent Labs  Lab 12/30/18 1759  GLUCAP 132*   D-Dimer No results for input(s): DDIMER in the last 72 hours. Hgb A1c No results for input(s): HGBA1C in the last 72 hours. Lipid Profile No results for input(s): CHOL, HDL, LDLCALC, TRIG, CHOLHDL, LDLDIRECT in the last 72 hours. Thyroid function studies Recent Labs    01/01/19 1352  TSH 1.391   Anemia work up No results for input(s): VITAMINB12, FOLATE, FERRITIN, TIBC, IRON, RETICCTPCT in the last 72 hours. Urinalysis    Component Value Date/Time   COLORURINE YELLOW 12/30/2018 0249   APPEARANCEUR CLEAR 12/30/2018 0249   LABSPEC >1.046 (H) 12/30/2018 0249   PHURINE 5.0 12/30/2018 0249   GLUCOSEU NEGATIVE 12/30/2018 0249   HGBUR NEGATIVE 12/30/2018 0249   BILIRUBINUR NEGATIVE  12/30/2018 0249   KETONESUR NEGATIVE 12/30/2018 0249   PROTEINUR NEGATIVE 12/30/2018 0249   NITRITE NEGATIVE 12/30/2018 0249   LEUKOCYTESUR NEGATIVE 12/30/2018 0249   Sepsis Labs Invalid input(s): PROCALCITONIN,  WBC,  LACTICIDVEN Microbiology Recent Results (from the past 240 hour(s))  SARS Coronavirus 2 (CEPHEID- Performed in [facility]  hospital lab), Hosp Order     Status: None   Collection Time: 12/30/18  3:03 AM  Result Value Ref Range Status   SARS Coronavirus 2 NEGATIVE NEGATIVE Final    Comment: (NOTE) If result is NEGATIVE SARS-CoV-2 target nucleic acids are NOT DETECTED. The SARS-CoV-2 RNA is generally detectable in upper and lower  respiratory specimens during the acute phase of infection. The lowest  concentration of SARS-CoV-2 viral copies this assay can detect is 250  copies / mL. A negative result does not preclude SARS-CoV-2 infection  and should not be used as the sole basis for treatment or other  patient management decisions.  A negative result may occur with  improper specimen collection / handling, submission of specimen other  than nasopharyngeal swab, presence of viral mutation(s) within the  areas targeted by this assay, and inadequate number of viral copies  (<250 copies / mL). A negative result must be combined with clinical  observations, patient history, and epidemiological information. If result is POSITIVE SARS-CoV-2 target nucleic acids are DETECTED. The SARS-CoV-2 RNA is generally detectable in upper and lower  respiratory specimens dur ing the acute phase of infection.  Positive  results are indicative of active infection with SARS-CoV-2.  Clinical  correlation with patient history and other diagnostic information is  necessary to determine patient infection status.  Positive results do  not rule out bacterial infection or co-infection with other viruses. If result is PRESUMPTIVE POSTIVE SARS-CoV-2 nucleic acids MAY BE PRESENT.   A presumptive  positive result was obtained on the submitted specimen  and confirmed on repeat testing.  While 2019 novel coronavirus  (SARS-CoV-2) nucleic acids may be present in the submitted sample  additional confirmatory testing may be necessary for epidemiological  and / or clinical management purposes  to differentiate between  SARS-CoV-2 and other Sarbecovirus currently known to infect humans.  If clinically indicated additional testing with an alternate test  methodology [phone]) is advised. The SARS-CoV-2 RNA is generally  detectable in upper and lower respiratory sp ecimens during the acute  phase of infection. The expected result is Negative. Fact Sheet for Patients:  StrictlyIdeas.no Fact Sheet for Healthcare Providers: BankingDealers.co.za This test is not yet approved or cleared by the [place] FDA and has been authorized for detection and/or diagnosis of SARS-CoV-2 by FDA under an Emergency Use Authorization (EUA).  This EUA will remain in effect (meaning this test can be used) for the duration of the COVID-19 declaration under Section 564(b)(1) of the Act, 21 U.S.C. section 360bbb-3(b)(1), unless the authorization is terminated or revoked sooner. Performed at [facility], [place] [address], [place], [place] [address]   Blood Culture (routine x 2)     Status: None (Preliminary result)   Collection Time: 12/30/18  3:24 AM  Result Value Ref Range Status   Specimen Description   Final    BLOOD RIGHT ANTECUBITAL Performed at [place] [facility] [address] [place], [place] [address]    Special Requests   Final    BOTTLES DRAWN AEROBIC AND ANAEROBIC Blood Culture adequate volume Performed at [facility] [address] [place], [place] [address]    Culture   Final    NO GROWTH 3 DAYS Performed at [facility], [place] [address], [place], [place] [address]    Report Status  PENDING  Incomplete  Blood Culture (routine x 2)     Status: None (Preliminary result)   Collection Time: 12/30/18  3:24 AM  Result Value Ref Range Status   Specimen Description   Final    BLOOD LEFT HAND Performed at [facility] [address], [place], [place] [address]    Special Requests   Final    BOTTLES DRAWN AEROBIC AND ANAEROBIC Blood Culture adequate volume Performed at [facility] [address], [place], [place] [address]    Culture   Final    NO GROWTH 3 DAYS Performed at [facility], [place] [address], [place], [place] [address]    Report Status PENDING  Incomplete  Culture, Urine     Status: None   Collection Time: 12/30/18  9:24 AM  Result Value Ref Range Status   Specimen Description   Final    URINE, RANDOM Performed at [facility] [address], [place], [place] [address]    Special Requests   Final    NONE Performed at [facility], [place] [address], [place], [place] [address]    Culture   Final    NO GROWTH Performed at [facility], [place] [address] [place], [place] [address]    Report Status 12/31/2018 FINAL  Final  MRSA PCR Screening     Status: None   Collection Time: 12/30/18  1:00 PM  Result Value Ref Range Status   MRSA by PCR NEGATIVE NEGATIVE Final    Comment:        The GeneXpert MRSA Assay (FDA approved for NASAL specimens only), is one component of a comprehensive MRSA colonization surveillance program. It is not intended to diagnose MRSA infection nor to guide or monitor treatment for MRSA infections. Performed at [facility], [place] [address], [place], [place] [address]     Please note: You were cared for by a hospitalist during your hospital stay. Once you are discharged, your primary care physician will handle any further medical issues. Please note that NO REFILLS for any discharge medications will be authorized once you  are discharged, as it is imperative that you return to your primary care physician (or establish a relationship with a primary care physician if you do not have one) for your post hospital discharge needs so that they can reassess your need for medications and monitor your lab values.    Time coordinating discharge: 40 minutes  SIGNED:   Shelly Coss, MD  [facility] 01/02/2019, 10:40 AM Pager [phone]  If 7PM-7AM, please contact night-coverage [email or web address] Password [email or web address]

## 2019-01-04 LAB — CULTURE, BLOOD (ROUTINE X 2)
Culture: NO GROWTH
Culture: NO GROWTH
Special Requests: ADEQUATE
Special Requests: ADEQUATE

## 2019-01-12 ENCOUNTER — Emergency Department (HOSPITAL_COMMUNITY): Payer: BC Managed Care – PPO

## 2019-01-12 ENCOUNTER — Other Ambulatory Visit: Payer: Self-pay

## 2019-01-12 ENCOUNTER — Encounter (HOSPITAL_COMMUNITY): Payer: Self-pay | Admitting: Emergency Medicine

## 2019-01-12 ENCOUNTER — Inpatient Hospital Stay (HOSPITAL_COMMUNITY)
Admission: EM | Admit: 2019-01-12 | Discharge: 2019-01-18 | DRG: 871 | Disposition: A | Discharge status: Home / Self Care | Payer: BC Managed Care – PPO | Attending: Family Medicine | Admitting: Family Medicine

## 2019-01-12 DIAGNOSIS — Z79899 Other long term (current) drug therapy: Secondary | ICD-10-CM

## 2019-01-12 DIAGNOSIS — R0602 Shortness of breath: Secondary | ICD-10-CM | POA: Diagnosis not present

## 2019-01-12 DIAGNOSIS — F4310 Post-traumatic stress disorder, unspecified: Secondary | ICD-10-CM | POA: Diagnosis present

## 2019-01-12 DIAGNOSIS — J9601 Acute respiratory failure with hypoxia: Secondary | ICD-10-CM | POA: Diagnosis present

## 2019-01-12 DIAGNOSIS — R059 Cough, unspecified: Secondary | ICD-10-CM

## 2019-01-12 DIAGNOSIS — E282 Polycystic ovarian syndrome: Secondary | ICD-10-CM | POA: Diagnosis present

## 2019-01-12 DIAGNOSIS — R05 Cough: Secondary | ICD-10-CM

## 2019-01-12 DIAGNOSIS — D869 Sarcoidosis, unspecified: Secondary | ICD-10-CM

## 2019-01-12 DIAGNOSIS — K219 Gastro-esophageal reflux disease without esophagitis: Secondary | ICD-10-CM | POA: Diagnosis present

## 2019-01-12 DIAGNOSIS — Z818 Family history of other mental and behavioral disorders: Secondary | ICD-10-CM

## 2019-01-12 DIAGNOSIS — A419 Sepsis, unspecified organism: Secondary | ICD-10-CM | POA: Diagnosis not present

## 2019-01-12 DIAGNOSIS — Z886 Allergy status to analgesic agent status: Secondary | ICD-10-CM

## 2019-01-12 DIAGNOSIS — I10 Essential (primary) hypertension: Secondary | ICD-10-CM | POA: Diagnosis present

## 2019-01-12 DIAGNOSIS — E669 Obesity, unspecified: Secondary | ICD-10-CM | POA: Diagnosis present

## 2019-01-12 DIAGNOSIS — F419 Anxiety disorder, unspecified: Secondary | ICD-10-CM | POA: Diagnosis present

## 2019-01-12 DIAGNOSIS — Z888 Allergy status to other drugs, medicaments and biological substances status: Secondary | ICD-10-CM

## 2019-01-12 DIAGNOSIS — Z20828 Contact with and (suspected) exposure to other viral communicable diseases: Secondary | ICD-10-CM | POA: Diagnosis present

## 2019-01-12 DIAGNOSIS — J440 Chronic obstructive pulmonary disease with acute lower respiratory infection: Secondary | ICD-10-CM | POA: Diagnosis present

## 2019-01-12 DIAGNOSIS — Y95 Nosocomial condition: Secondary | ICD-10-CM | POA: Diagnosis present

## 2019-01-12 DIAGNOSIS — Z813 Family history of other psychoactive substance abuse and dependence: Secondary | ICD-10-CM

## 2019-01-12 DIAGNOSIS — M069 Rheumatoid arthritis, unspecified: Secondary | ICD-10-CM | POA: Diagnosis present

## 2019-01-12 DIAGNOSIS — Z7951 Long term (current) use of inhaled steroids: Secondary | ICD-10-CM

## 2019-01-12 DIAGNOSIS — Z811 Family history of alcohol abuse and dependence: Secondary | ICD-10-CM

## 2019-01-12 DIAGNOSIS — E876 Hypokalemia: Secondary | ICD-10-CM | POA: Diagnosis not present

## 2019-01-12 DIAGNOSIS — J189 Pneumonia, unspecified organism: Secondary | ICD-10-CM | POA: Diagnosis present

## 2019-01-12 DIAGNOSIS — Z6839 Body mass index (BMI) 39.0-39.9, adult: Secondary | ICD-10-CM

## 2019-01-12 DIAGNOSIS — F329 Major depressive disorder, single episode, unspecified: Secondary | ICD-10-CM | POA: Diagnosis present

## 2019-01-12 LAB — URINALYSIS, ROUTINE W REFLEX MICROSCOPIC
Bilirubin Urine: NEGATIVE
Glucose, UA: NEGATIVE mg/dL
Hgb urine dipstick: NEGATIVE
Ketones, ur: NEGATIVE mg/dL
Leukocytes,Ua: NEGATIVE
Nitrite: NEGATIVE
Protein, ur: NEGATIVE mg/dL
Specific Gravity, Urine: 1.019 (ref 1.005–1.030)
pH: 5 (ref 5.0–8.0)

## 2019-01-12 LAB — CBC WITH DIFFERENTIAL/PLATELET
Abs Immature Granulocytes: 0.04 10*3/uL (ref 0.00–0.07)
Basophils Absolute: 0 10*3/uL (ref 0.0–0.1)
Basophils Relative: 0 %
Eosinophils Absolute: 0.1 10*3/uL (ref 0.0–0.5)
Eosinophils Relative: 1 %
HCT: 45.9 % (ref 36.0–46.0)
Hemoglobin: 14.3 g/dL (ref 12.0–15.0)
Immature Granulocytes: 0 %
Lymphocytes Relative: 18 %
Lymphs Abs: 1.9 10*3/uL (ref 0.7–4.0)
MCH: 27.8 pg (ref 26.0–34.0)
MCHC: 31.2 g/dL (ref 30.0–36.0)
MCV: 89.3 fL (ref 80.0–100.0)
Monocytes Absolute: 0.8 10*3/uL (ref 0.1–1.0)
Monocytes Relative: 8 %
Neutro Abs: 7.5 10*3/uL (ref 1.7–7.7)
Neutrophils Relative %: 73 %
Platelets: 369 10*3/uL (ref 150–400)
RBC: 5.14 MIL/uL — ABNORMAL HIGH (ref 3.87–5.11)
RDW: 13.2 % (ref 11.5–15.5)
WBC: 10.4 10*3/uL (ref 4.0–10.5)
nRBC: 0 % (ref 0.0–0.2)

## 2019-01-12 LAB — COMPREHENSIVE METABOLIC PANEL
ALT: 25 U/L (ref 0–44)
AST: 26 U/L (ref 15–41)
Albumin: 3.8 g/dL (ref 3.5–5.0)
Alkaline Phosphatase: 99 U/L (ref 38–126)
Anion gap: 10 (ref 5–15)
BUN: 11 mg/dL (ref 6–20)
CO2: 23 mmol/L (ref 22–32)
Calcium: 9.1 mg/dL (ref 8.9–10.3)
Chloride: 105 mmol/L (ref 98–111)
Creatinine, Ser: 0.72 mg/dL (ref 0.44–1.00)
GFR calc Af Amer: >60 mL/min (ref >60)
GFR calc non Af Amer: >60 mL/min (ref >60)
Glucose, Bld: 101 mg/dL — ABNORMAL HIGH (ref 70–99)
Potassium: 3.7 mmol/L (ref 3.5–5.1)
Sodium: 138 mmol/L (ref 135–145)
Total Bilirubin: 0.2 mg/dL — ABNORMAL LOW (ref 0.3–1.2)
Total Protein: 8.9 g/dL — ABNORMAL HIGH (ref 6.5–8.1)

## 2019-01-12 LAB — FERRITIN: Ferritin: 40 ng/mL (ref 11–307)

## 2019-01-12 LAB — FIBRINOGEN: Fibrinogen: 562 mg/dL — ABNORMAL HIGH (ref 210–475)

## 2019-01-12 LAB — TRIGLYCERIDES: Triglycerides: 119 mg/dL (ref <150)

## 2019-01-12 LAB — SARS CORONAVIRUS 2 BY RT PCR (HOSPITAL ORDER, PERFORMED IN ~~LOC~~ HOSPITAL LAB): SARS Coronavirus 2: NEGATIVE

## 2019-01-12 LAB — D-DIMER, QUANTITATIVE: D-Dimer, Quant: 0.85 ug/mL-FEU — ABNORMAL HIGH (ref 0.00–0.50)

## 2019-01-12 LAB — BRAIN NATRIURETIC PEPTIDE: B Natriuretic Peptide: 34.2 pg/mL (ref 0.0–100.0)

## 2019-01-12 LAB — LACTATE DEHYDROGENASE: LDH: 213 U/L — ABNORMAL HIGH (ref 98–192)

## 2019-01-12 LAB — LACTIC ACID, PLASMA: Lactic Acid, Venous: 1.8 mmol/L (ref 0.5–1.9)

## 2019-01-12 LAB — C-REACTIVE PROTEIN: CRP: 2.3 mg/dL — ABNORMAL HIGH (ref <1.0)

## 2019-01-12 LAB — TROPONIN I: Troponin I: <0.03 ng/mL (ref <0.03)

## 2019-01-12 LAB — HCG, QUANTITATIVE, PREGNANCY: hCG, Beta Chain, Quant, S: <1 m[IU]/mL (ref <5)

## 2019-01-12 LAB — PROCALCITONIN: Procalcitonin: <0.1 ng/mL

## 2019-01-12 MED ORDER — IOHEXOL 350 MG/ML SOLN
100.0000 mL | Freq: Once | INTRAVENOUS | Status: AC | PRN
  Administered 2019-01-12: 100 mL via INTRAVENOUS

## 2019-01-12 MED ORDER — SODIUM CHLORIDE 0.9 % IV SOLN
2.0000 g | Freq: Once | INTRAVENOUS | Status: AC
  Administered 2019-01-12: 2 g via INTRAVENOUS
  Filled 2019-01-12: qty 2

## 2019-01-12 MED ORDER — SODIUM CHLORIDE 0.9 % IV BOLUS
1000.0000 mL | Freq: Once | INTRAVENOUS | Status: AC
  Administered 2019-01-12: 1000 mL via INTRAVENOUS

## 2019-01-12 MED ORDER — VANCOMYCIN HCL 10 G IV SOLR
1500.0000 mg | Freq: Once | INTRAVENOUS | Status: AC
  Administered 2019-01-12: 1500 mg via INTRAVENOUS
  Filled 2019-01-12: qty 1500

## 2019-01-12 MED ORDER — SODIUM CHLORIDE (PF) 0.9 % IJ SOLN
INTRAMUSCULAR | Status: AC
  Filled 2019-01-12: qty 50

## 2019-01-12 MED ORDER — ACETAMINOPHEN 325 MG PO TABS
650.0000 mg | ORAL_TABLET | Freq: Once | ORAL | Status: AC
  Administered 2019-01-12: 650 mg via ORAL
  Filled 2019-01-12: qty 2

## 2019-01-12 NOTE — ED Triage Notes (Signed)
Patient arrived by EMS from home. PT was seen in ED and diagnosed w/ Sarcoidosis per EMS.  Pt c/o SOB.  Pt was put on Antibiotics per EMS.    EMS VS SpO2 99%, BP 136/76, T 97.7, CBG 124.

## 2019-01-12 NOTE — ED Provider Notes (Signed)
[facility] DEPT Provider Note   CSN: [id number] Arrival date & time: 01/12/19  1829  History   Chief Complaint Shortness of breath  HPI Anna Lucero is a 43 y.o. female with past medical history significant for PCOS, RA Remicade infusions, COPD, GERD, depression, anxiety, recent hospitalization for sepsis who presents for evaluation of fever, cough and shortness of breath.  She states her cough and shortness of breath had not resolved when she was previously discharged from the hospital.  States this is been worsening since her discharge.  Has felt intermittent chills and warm at home however is not taken her temperature.  She is been tolerating p.o. intake at home without difficulty.  States she was discharged with 3 days of amoxicillin and completed this medicine.  She was diagnosed with sarcoid and a recent hospital admission however does not have follow-up with pulmonary until the end of June.  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  States she has had mild right-sided chest pain which does not radiate.  Nonpleuritic in nature.  States she had similar pain on her last hospital admission.  Symptoms have been worsening since her recent hospital discharge.  She denies any aggravating or alleviating factors.  Denies headache, vision changes, sore throat, congestion, rhinorrhea, neck pain, neck stiffness, hemoptysis, abdominal pain, diarrhea, dysuria.  Denies lower extremity pain, redness or warmth.  Denies history of PE or DVT.  History provided by patient and past medical records.  No interpreter was used.     HPI  Past Medical History:  Diagnosis Date  . Anemia   . Anxiety   . Bronchitis   . Depression   . GERD (gastroesophageal reflux disease)   . Gestational diabetes   . Iritis   . PCOS (polycystic ovarian syndrome)   . RA (rheumatoid arthritis) [facility])     Patient Active Problem List   Diagnosis Date Noted  . Sepsis ([place]) 01/13/2019  . Lymphadenopathy, thoracic 12/31/2018  . SIRS (systemic inflammatory response syndrome) ([place]) 12/30/2018  . Obesity (BMI 30-39.9) 12/30/2018  . Class 2 obesity due to excess calories without serious comorbidity in adult 07/04/2018  . PTSD (post-traumatic stress disorder) 07/02/2018  . Nuclear sclerotic cataract of both eyes 04/09/2018  . 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 Retinal edema 04/09/2018  . Maternal age 20+, multigravida, antepartum 01/03/2018  . Drug therapy 10/27/2017  . Uterine prolapse 09/26/2017  . Miscarriage 08/30/2017  . Anemia 05/25/2017  . Gastro-esophageal reflux disease with esophagitis 05/25/2017  . Polycystic ovaries 05/25/2017  . Rheumatoid arthritis with positive rheumatoid factor ([place]) 05/25/2017    Past Surgical History:  Procedure Laterality Date  . abdominal plasty    . BREAST REDUCTION SURGERY    . DILATION AND CURETTAGE OF UTERUS    . DILATION AND EVACUATION N/A 08/09/2017   Procedure: DILATATION AND EVACUATION;  Surgeon: Allyn Kenner, DO;  Location: [facility];  Service: Gynecology;  Laterality: N/A;  . filopian tube removal    . REDUCTION MAMMAPLASTY Bilateral      OB History    Gravida  6   Para  3   Term  3   Preterm      AB  3   Living  3     SAB  1   TAB  1   Ectopic  1   Multiple      Live Births  Obstetric Comments  08/2017- D&C for MAB 12/2017- surgical TAB @ ~10 wks         Home Medications    Prior to Admission medications   Medication Sig Start Date End Date Taking? Authorizing Provider  acetaminophen (TYLENOL) 500 MG tablet Take 1,000 mg by mouth every 6 (six) hours as needed for moderate pain.    [provider]  Albuterol Sulfate (PROAIR RESPICLICK) 867 (90 Base) MCG/ACT AEPB Inhale 2 puffs into the lungs every 6 (six) hours as needed (shortness of breath).  11/30/18   [provider]  amLODipine  (NORVASC) 5 MG tablet Take 1 tablet (5 mg total) by mouth daily. 01/02/19   Shelly Coss, MD  ARIPiprazole (ABILIFY) 5 MG tablet Take 1 tablet (5 mg total) by mouth daily. 10/25/18 10/25/19  Arfeen, Arlyce Harman, MD  beclomethasone (QVAR) 40 MCG/ACT inhaler Inhale 1 puff into the lungs 2 (two) times daily.    [provider]  citalopram (CELEXA) 40 MG tablet Take 1 tablet (40 mg total) by mouth daily. Patient not taking: Reported on 12/30/2018 10/25/18 10/25/19  Arfeen, Arlyce Harman, MD  cyclobenzaprine (FLEXERIL) 10 MG tablet Take 10 mg by mouth 3 (three) times daily as needed for muscle spasms.  09/04/18   [provider]  diclofenac (VOLTAREN) 75 MG EC tablet Take 150 mg by mouth 2 (two) times a day. 12/07/18   [provider]  doxepin (SINEQUAN) 25 MG capsule Take 1 capsule (25 mg total) by mouth at bedtime as needed (insomnia). 10/25/18   Arfeen, Arlyce Harman, MD  fexofenadine (ALLEGRA) 180 MG tablet Take 180 mg by mouth daily. 11/30/18   [provider]  fluticasone (FLONASE) 50 MCG/ACT nasal spray Place 1 spray into the nose 2 (two) times a day. 11/30/18 11/30/19  [provider]  folic acid (FOLVITE) 1 MG tablet Take 1 mg by mouth daily.    [provider]  inFLIXimab (REMICADE IV) Inject into the vein every 6 (six) weeks.     [provider]  methotrexate (RHEUMATREX) 2.5 MG tablet Take 20 mg by mouth every Saturday. 12/07/18   [provider]  montelukast (SINGULAIR) 10 MG tablet Take 10 mg by mouth daily.    [provider]  Multiple Vitamins-Minerals (EMERGEN-C IMMUNE) PACK Take 1 Package by mouth 3 (three) times a week.    [provider]  Multiple Vitamins-Minerals (MULTIVITAMIN GUMMIES ADULT) CHEW Chew 2 each by mouth daily.    [provider]  omeprazole (PRILOSEC) 40 MG capsule Take 40 mg by mouth daily.    [provider]  predniSONE (STERAPRED UNI-PAK 21 TAB) 10 MG (21) TBPK tablet Take 10-60 mg by mouth See  admin instructions. Taper as directed on pack for 6 days Take 60 mg on day 1, 50 mg on day 2, 40 mg on day 3, 30 mg on day 4, 20 mg on day 5 and 10 mg on day 6    [provider]    Family History Family History  Problem Relation Age of Onset  . Alcohol abuse Mother   . Drug abuse Mother   . Bipolar disorder Mother   . Anxiety disorder Mother   . Depression Mother   . Alcohol abuse Father   . Drug abuse Father   . Anxiety disorder Sister   . Depression Sister   . Breast cancer Neg Hx     Social History Social History   Tobacco Use  . Smoking status: Never Smoker  .  Smokeless tobacco: Never Used  Substance Use Topics  . Alcohol use: Yes    Alcohol/week: 3.0 standard drinks    Types: 3 Glasses of wine per week    Comment: 3x a week  . Drug use: No     Allergies   Lamictal [lamotrigine], Celebrex [celecoxib], and Spironolactone   Review of Systems Review of Systems  Constitutional: Positive for chills, fatigue and fever. Negative for activity change, appetite change and diaphoresis.  HENT: Negative.   Eyes: Negative.   Respiratory: Positive for cough and shortness of breath. Negative for apnea, choking, chest tightness, wheezing and stridor.   Cardiovascular: Positive for chest pain. Negative for palpitations and leg swelling.  Genitourinary: Negative.   Musculoskeletal: Negative.   Skin: Negative.   Neurological: Negative.   All other systems reviewed and are negative.  Physical Exam Updated Vital Signs BP (!) 140/83 (BP Location: Right Arm)   Pulse (!) 111   Temp (!) 102.9 F (39.4 C) (Oral)   Resp (!) 17   Ht 5\' 2"  (1.575 m)   Wt 97.5 kg   LMP 12/09/2018   SpO2 98%   BMI 39.32 kg/m   Physical Exam Vitals signs and nursing note reviewed.  Constitutional:      General: She is not in acute distress.    Appearance: She is not ill-appearing, toxic-appearing or diaphoretic.     Interventions: She is not intubated. HENT:     Head:  Normocephalic and atraumatic.     Jaw: There is normal jaw occlusion.     Nose:     Comments: No rhinorrhea and congestion to bilateral nares.  No sinus tenderness.    Mouth/Throat:     Mouth: Mucous membranes are moist.     Pharynx: Oropharynx is clear.     Comments: Posterior oropharynx clear.  Mucous membranes moist.  Tonsils without erythema or exudate.  Uvula midline without deviation.  No evidence of PTA or RPA.  No drooling, dysphasia or trismus.  Phonation normal. Neck:     Trachea: Trachea and phonation normal.     Comments: No Neck stiffness or neck rigidity.  No meningismus.  No cervical lymphadenopathy. Cardiovascular:     Rate and Rhythm: Tachycardia present.     Pulses: Normal pulses.     Heart sounds: Normal heart sounds.     Comments: No murmurs rubs or gallops. Pulmonary:     Effort: Tachypnea present. No accessory muscle usage, respiratory distress or retractions. She is not intubated.     Comments: Mild bilateral wet lung sounds. No wheeze.  No accessory muscle usage.  Able speak in full sentences. Tachypnea present. Abdominal:     Comments: Soft, nontender without rebound or guarding.  No CVA tenderness.  Musculoskeletal:     Comments: Moves all 4 extremities without difficulty.  Lower extremities without edema, erythema or warmth.  Skin:    Comments: Brisk capillary refill.  No rashes or lesions.  Neurological:     Mental Status: She is alert.     Comments: Ambulatory in department without difficulty.  Cranial nerves II through XII grossly intact.  No facial droop.  No aphasia.    ED Treatments / Results  Labs (all labs ordered are listed, but only abnormal results are displayed) Labs Reviewed  URINALYSIS, ROUTINE W REFLEX MICROSCOPIC - Abnormal; Notable for the following components:      Result Value   APPearance HAZY (*)    All other components within normal limits  CBC WITH DIFFERENTIAL/PLATELET -  Abnormal; Notable for the following components:   RBC  5.14 (*)    All other components within normal limits  COMPREHENSIVE METABOLIC PANEL - Abnormal; Notable for the following components:   Glucose, Bld 101 (*)    Total Protein 8.9 (*)    Total Bilirubin 0.2 (*)    All other components within normal limits  D-DIMER, QUANTITATIVE (NOT AT [facility]) - Abnormal; Notable for the following components:   D-Dimer, Quant 0.85 (*)    All other components within normal limits  LACTATE DEHYDROGENASE - Abnormal; Notable for the following components:   LDH 213 (*)    All other components within normal limits  FIBRINOGEN - Abnormal; Notable for the following components:   Fibrinogen 562 (*)    All other components within normal limits  C-REACTIVE PROTEIN - Abnormal; Notable for the following components:   CRP 2.3 (*)    All other components within normal limits  CULTURE, BLOOD (ROUTINE X 2)  CULTURE, BLOOD (ROUTINE X 2)  SARS CORONAVIRUS 2 (HOSPITAL ORDER, [facility])  URINE CULTURE  LACTIC ACID, PLASMA  PROCALCITONIN  FERRITIN  TRIGLYCERIDES  TROPONIN I  BRAIN NATRIURETIC PEPTIDE  HCG, QUANTITATIVE, PREGNANCY  LACTIC ACID, PLASMA  I-STAT BETA HCG BLOOD, ED ([facility], [facility], [facility] ONLY)    EKG None  Radiology Ct Angio Chest Pe W/cm &/or Wo Cm  Result Date: 01/12/2019 CLINICAL DATA:  Shortness of breath EXAM: CT ANGIOGRAPHY CHEST WITH CONTRAST TECHNIQUE: Multidetector CT imaging of the chest was performed using the standard protocol during bolus administration of intravenous contrast. Multiplanar CT image reconstructions and MIPs were obtained to evaluate the vascular anatomy. CONTRAST:  178mL OMNIPAQUE IOHEXOL 350 MG/ML SOLN COMPARISON:  12/30/2018 FINDINGS: Cardiovascular: Evaluation is significantly limited by motion artifact. Given this limitation no definite PE was identified. The heart size is stable from prior study. There is no pericardial effusion. There is significant narrowing of the right upper lobe pulmonary artery which  is stable from prior study. Mediastinum/Nodes: Again identified are enlarged mediastinal and hilar lymph nodes. There are enlarged right supraclavicular lymph nodes. There is a small left-sided thyroid nodule. No significant axillary adenopathy. Lungs/Pleura: The lung volumes are low. Again noted are scattered pulmonary opacities bilaterally. There is significant narrowing of the bilateral lower lobe bronchi secondary to mass effect from the nearby adenopathy. There is no pneumothorax. Upper Abdomen: No acute abnormality. Musculoskeletal: There is an old healed right-sided rib fracture. Review of the MIP images confirms the above findings. IMPRESSION: 1. Examination limited by motion artifact. 2. No definite PE given the limitations described above. 3. Persistent mediastinal adenopathy and scattered bilateral pulmonary opacities is again most consistent with sarcoidosis. The patient's adenopathy causes significant narrowing of the right upper lobe pulmonary artery as well as mild to moderate narrowing of the bilateral lower lobe bronchi. Electronically Signed   By: Constance Holster M.D.   On: 01/12/2019 23:57   Dg Chest Portable 1 View  Result Date: 01/12/2019 CLINICAL DATA:  Cough and shortness of breath EXAM: PORTABLE CHEST 1 VIEW COMPARISON:  Chest x-ray dated 12/30/2018 FINDINGS: Heart size is normal. Again identified is bilateral hilar adenopathy. There is no pneumothorax. No large pleural effusion. No focal area of consolidation. IMPRESSION: Stable appearance of the chest with no active disease. Electronically Signed   By: Constance Holster M.D.   On: 01/12/2019 20:18    Procedures .Critical Care Performed by: Nettie Elm, PA-C Authorized by: Nettie Elm, PA-C   Critical  care provider statement:    Critical care time (minutes):  45   Critical care was necessary to treat or prevent imminent or life-threatening deterioration of the following conditions:  Sepsis   Critical care was  time spent personally by me on the following activities:  Discussions with consultants, evaluation of patient's response to treatment, examination of patient, ordering and performing treatments and interventions, ordering and review of laboratory studies, ordering and review of radiographic studies, pulse oximetry, re-evaluation of patient's condition, obtaining history from patient or surrogate, review of old charts and discussions with primary provider   (including critical care time)  Medications Ordered in ED Medications  sodium chloride (PF) 0.9 % injection (has no administration in time range)  sodium chloride 0.9 % bolus 1,000 mL (0 mLs Intravenous Stopped 01/12/19 2042)  ceFEPIme (MAXIPIME) 2 g in sodium chloride 0.9 % 100 mL IVPB ( Intravenous Stopped 01/12/19 2039)  acetaminophen (TYLENOL) tablet 650 mg (650 mg Oral Given 01/12/19 1940)  vancomycin (VANCOCIN) 1,500 mg in sodium chloride 0.9 % 500 mL IVPB ( Intravenous Stopped 01/13/19 0045)  iohexol (OMNIPAQUE) 350 MG/ML injection 100 mL (100 mLs Intravenous Contrast Given 01/12/19 2319)  sodium chloride 0.9 % bolus 1,000 mL (1,000 mLs Intravenous New Bag/Given 01/13/19 0048)    Initial Impression / Assessment and Plan / ED Course  I have reviewed the triage vital signs and the nursing notes.  Pertinent labs & imaging results that were available during my care of the patient were reviewed by me and considered in my medical decision making (see chart for details).  43 year old presents for evaluation of her, cough shortness of breath.  On arrival temperature 102.9, tachycardic to 135, tachypneic to 22.  Recently hospitalized for similar symptoms 2 weeks ago. No know COVID exposures. Will initiate code sepsis and provide fluids, ABX that cover for HCAP given recent hospitalization and get COVID testing. Right sided CP, no exertional and non pleuritic. No associated diaphoresis, hemoptysis, dizziness, lightheadedness, radiation of pain, N/V.    Labs and imaging personally reviewed. Heart score2 risk factors Ddimer 0.85 CBC: Without leukocytosis CMP: No acute findings Lactic acid :1.8 Blood culture obtained: Troponin: negative Urinalysis: negative, Will culture DG Chest with stable chest. No acute AP findings. COVID negative CT PA -- No definite PE given the limitations described above. 3. Persistent mediastinal adenopathy and scattered bilateral pulmonary opacities is again most consistent with sarcoidosis. The patient's adenopathy causes significant narrowing of the right upper lobe pulmonary artery as well as mild to moderate narrowing of the bilateral lower lobe bronchi. EKG not pulling over into epic.  No ST/T changes.  No STEMI.  Patient continues to be tachycardic and tachypneic.  She is received 2 L IV fluids as well as antibiotics for H CAP.  Original cover test was negative however nursing states this was not a good sample.  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  Urinalysis negative for infection, no urinary symptoms.  Abdomen nontender.  Without diarrhea.  Of low suspicion for AP pathology as cause of her fever.  She has no rashes, lesions, erythema or warmth to suggest skin infectious process as cause of her fever.  No neck stiffness or neck rigidity.  No meningismus.  Low suspicion for meningitis.  No other evidence of infectious process on exam.  Continues to be tachycardic with mild tachypnea despite fluids and IVF. Will consult with hospitalist for admission.  Consulted with Dr. Ara Kussmaul, [facility] who agrees to evaluate patient for admission.  The patient appears reasonably stabilized for admission considering the current resources, flow, and capabilities available in the ED at this time, and I doubt any other [facility] requiring further screening and/or treatment in the ED prior to admission.     Final Clinical Impressions(s) / ED  Diagnoses   Final diagnoses:  Sepsis without acute organ dysfunction, due to unspecified organism [facility])  Cough  Sarcoid    ED Discharge Orders    None       Raymona Boss A, PA-C 01/13/19 0117    Virgel Manifold, MD 01/14/19 1616

## 2019-01-12 NOTE — Progress Notes (Signed)
A consult was received from an ED physician for vancomycin per pharmacy dosing (for an indication other than meningitis). The patient's profile has been reviewed for ht/wt/allergies/indication/available labs. A one time order has been placed for the above antibiotics.  Further antibiotics/pharmacy consults should be ordered by admitting physician if indicated.                       Reuel Boom, PharmD, BCPS [phone] 01/12/2019, 7:29 PM

## 2019-01-12 NOTE — ED Notes (Signed)
Urine culture sent to the lab. 

## 2019-01-12 NOTE — ED Notes (Signed)
Bed: FV49 Expected date:  Expected time:  Means of arrival:  Comments: EMS/URI sx

## 2019-01-13 ENCOUNTER — Encounter (HOSPITAL_COMMUNITY): Payer: Self-pay

## 2019-01-13 ENCOUNTER — Other Ambulatory Visit: Payer: Self-pay

## 2019-01-13 DIAGNOSIS — D869 Sarcoidosis, unspecified: Secondary | ICD-10-CM | POA: Diagnosis present

## 2019-01-13 DIAGNOSIS — D861 Sarcoidosis of lymph nodes: Secondary | ICD-10-CM | POA: Diagnosis not present

## 2019-01-13 DIAGNOSIS — I10 Essential (primary) hypertension: Secondary | ICD-10-CM | POA: Diagnosis present

## 2019-01-13 DIAGNOSIS — K219 Gastro-esophageal reflux disease without esophagitis: Secondary | ICD-10-CM | POA: Diagnosis present

## 2019-01-13 DIAGNOSIS — F4310 Post-traumatic stress disorder, unspecified: Secondary | ICD-10-CM | POA: Diagnosis present

## 2019-01-13 DIAGNOSIS — F419 Anxiety disorder, unspecified: Secondary | ICD-10-CM | POA: Diagnosis present

## 2019-01-13 DIAGNOSIS — J440 Chronic obstructive pulmonary disease with acute lower respiratory infection: Secondary | ICD-10-CM | POA: Diagnosis present

## 2019-01-13 DIAGNOSIS — J189 Pneumonia, unspecified organism: Secondary | ICD-10-CM | POA: Diagnosis present

## 2019-01-13 DIAGNOSIS — E669 Obesity, unspecified: Secondary | ICD-10-CM | POA: Diagnosis present

## 2019-01-13 DIAGNOSIS — Z811 Family history of alcohol abuse and dependence: Secondary | ICD-10-CM | POA: Diagnosis not present

## 2019-01-13 DIAGNOSIS — J9601 Acute respiratory failure with hypoxia: Secondary | ICD-10-CM | POA: Diagnosis present

## 2019-01-13 DIAGNOSIS — Z813 Family history of other psychoactive substance abuse and dependence: Secondary | ICD-10-CM | POA: Diagnosis not present

## 2019-01-13 DIAGNOSIS — R0602 Shortness of breath: Secondary | ICD-10-CM | POA: Diagnosis present

## 2019-01-13 DIAGNOSIS — A419 Sepsis, unspecified organism: Secondary | ICD-10-CM | POA: Diagnosis present

## 2019-01-13 DIAGNOSIS — R590 Localized enlarged lymph nodes: Secondary | ICD-10-CM

## 2019-01-13 DIAGNOSIS — Z818 Family history of other mental and behavioral disorders: Secondary | ICD-10-CM | POA: Diagnosis not present

## 2019-01-13 DIAGNOSIS — E282 Polycystic ovarian syndrome: Secondary | ICD-10-CM | POA: Diagnosis present

## 2019-01-13 DIAGNOSIS — Z888 Allergy status to other drugs, medicaments and biological substances status: Secondary | ICD-10-CM | POA: Diagnosis not present

## 2019-01-13 DIAGNOSIS — Z20828 Contact with and (suspected) exposure to other viral communicable diseases: Secondary | ICD-10-CM | POA: Diagnosis present

## 2019-01-13 DIAGNOSIS — M069 Rheumatoid arthritis, unspecified: Secondary | ICD-10-CM | POA: Diagnosis present

## 2019-01-13 DIAGNOSIS — Z79899 Other long term (current) drug therapy: Secondary | ICD-10-CM | POA: Diagnosis not present

## 2019-01-13 DIAGNOSIS — Z6839 Body mass index (BMI) 39.0-39.9, adult: Secondary | ICD-10-CM | POA: Diagnosis not present

## 2019-01-13 DIAGNOSIS — Z7951 Long term (current) use of inhaled steroids: Secondary | ICD-10-CM | POA: Diagnosis not present

## 2019-01-13 DIAGNOSIS — Y95 Nosocomial condition: Secondary | ICD-10-CM | POA: Diagnosis present

## 2019-01-13 DIAGNOSIS — F329 Major depressive disorder, single episode, unspecified: Secondary | ICD-10-CM | POA: Diagnosis present

## 2019-01-13 DIAGNOSIS — Z886 Allergy status to analgesic agent status: Secondary | ICD-10-CM | POA: Diagnosis not present

## 2019-01-13 DIAGNOSIS — E876 Hypokalemia: Secondary | ICD-10-CM | POA: Diagnosis not present

## 2019-01-13 LAB — CBC
HCT: 41.9 % (ref 36.0–46.0)
Hemoglobin: 12.5 g/dL (ref 12.0–15.0)
MCH: 27.2 pg (ref 26.0–34.0)
MCHC: 29.8 g/dL — ABNORMAL LOW (ref 30.0–36.0)
MCV: 91.1 fL (ref 80.0–100.0)
Platelets: 309 10*3/uL (ref 150–400)
RBC: 4.6 MIL/uL (ref 3.87–5.11)
RDW: 13.2 % (ref 11.5–15.5)
WBC: 8.9 10*3/uL (ref 4.0–10.5)
nRBC: 0 % (ref 0.0–0.2)

## 2019-01-13 LAB — RESPIRATORY PANEL BY PCR

## 2019-01-13 LAB — COMPREHENSIVE METABOLIC PANEL
ALT: 20 U/L (ref 0–44)
AST: 18 U/L (ref 15–41)
Albumin: 3 g/dL — ABNORMAL LOW (ref 3.5–5.0)
Alkaline Phosphatase: 75 U/L (ref 38–126)
Anion gap: 5 (ref 5–15)
BUN: 8 mg/dL (ref 6–20)
CO2: 22 mmol/L (ref 22–32)
Calcium: 8.4 mg/dL — ABNORMAL LOW (ref 8.9–10.3)
Chloride: 111 mmol/L (ref 98–111)
Creatinine, Ser: 0.7 mg/dL (ref 0.44–1.00)
GFR calc Af Amer: >60 mL/min (ref >60)
GFR calc non Af Amer: >60 mL/min (ref >60)
Glucose, Bld: 114 mg/dL — ABNORMAL HIGH (ref 70–99)
Potassium: 3.6 mmol/L (ref 3.5–5.1)
Sodium: 138 mmol/L (ref 135–145)
Total Bilirubin: 0.3 mg/dL (ref 0.3–1.2)
Total Protein: 7.1 g/dL (ref 6.5–8.1)

## 2019-01-13 LAB — LACTIC ACID, PLASMA: Lactic Acid, Venous: 1.2 mmol/L (ref 0.5–1.9)

## 2019-01-13 LAB — MAGNESIUM: Magnesium: 1.6 mg/dL — ABNORMAL LOW (ref 1.7–2.4)

## 2019-01-13 LAB — PHOSPHORUS: Phosphorus: 3 mg/dL (ref 2.5–4.6)

## 2019-01-13 MED ORDER — MAGNESIUM CITRATE PO SOLN: 1.0000 | Freq: Once | ORAL | Status: DC | PRN

## 2019-01-13 MED ORDER — BENZONATATE 100 MG PO CAPS
100.0000 mg | ORAL_CAPSULE | Freq: Three times a day (TID) | ORAL | Status: DC | PRN
  Administered 2019-01-13 – 2019-01-15 (×8): 100 mg via ORAL
  Filled 2019-01-13 (×8): qty 1

## 2019-01-13 MED ORDER — LEVALBUTEROL HCL 0.63 MG/3ML IN NEBU: 0.6300 mg | INHALATION_SOLUTION | Freq: Three times a day (TID) | RESPIRATORY_TRACT | Status: DC

## 2019-01-13 MED ORDER — ENOXAPARIN SODIUM 40 MG/0.4ML ~~LOC~~ SOLN
40.0000 mg | SUBCUTANEOUS | Status: DC
  Administered 2019-01-13 – 2019-01-18 (×6): 40 mg via SUBCUTANEOUS
  Filled 2019-01-13 (×6): qty 0.4

## 2019-01-13 MED ORDER — ACETAMINOPHEN 650 MG RE SUPP: 650.0000 mg | Freq: Four times a day (QID) | RECTAL | Status: DC | PRN

## 2019-01-13 MED ORDER — METHOTREXATE 2.5 MG PO TABS: 20.0000 mg | ORAL_TABLET | ORAL | Status: DC

## 2019-01-13 MED ORDER — PANTOPRAZOLE SODIUM 40 MG PO TBEC
40.0000 mg | DELAYED_RELEASE_TABLET | Freq: Every day | ORAL | Status: DC
  Administered 2019-01-13 – 2019-01-16 (×4): 40 mg via ORAL
  Filled 2019-01-13 (×4): qty 1

## 2019-01-13 MED ORDER — ONDANSETRON HCL 4 MG/2ML IJ SOLN: 4.0000 mg | Freq: Four times a day (QID) | INTRAMUSCULAR | Status: DC | PRN

## 2019-01-13 MED ORDER — SODIUM CHLORIDE 0.9 % IV BOLUS
1000.0000 mL | Freq: Once | INTRAVENOUS | Status: AC
  Administered 2019-01-13: 1000 mL via INTRAVENOUS

## 2019-01-13 MED ORDER — SODIUM CHLORIDE 0.9 % IV SOLN
2.0000 g | Freq: Three times a day (TID) | INTRAVENOUS | Status: DC
  Administered 2019-01-13 – 2019-01-17 (×13): 2 g via INTRAVENOUS
  Filled 2019-01-13 (×15): qty 2

## 2019-01-13 MED ORDER — OXYCODONE HCL 5 MG PO TABS: 5.0000 mg | ORAL_TABLET | ORAL | Status: DC | PRN

## 2019-01-13 MED ORDER — ACETAMINOPHEN 325 MG PO TABS
650.0000 mg | ORAL_TABLET | Freq: Four times a day (QID) | ORAL | Status: DC | PRN
  Administered 2019-01-13 – 2019-01-15 (×4): 650 mg via ORAL
  Filled 2019-01-13 (×4): qty 2

## 2019-01-13 MED ORDER — FOLIC ACID 1 MG PO TABS
1.0000 mg | ORAL_TABLET | Freq: Every day | ORAL | Status: DC
  Administered 2019-01-13 – 2019-01-18 (×6): 1 mg via ORAL
  Filled 2019-01-13 (×6): qty 1

## 2019-01-13 MED ORDER — VANCOMYCIN HCL 10 G IV SOLR
1500.0000 mg | INTRAVENOUS | Status: DC
  Administered 2019-01-13 – 2019-01-14 (×2): 1500 mg via INTRAVENOUS
  Filled 2019-01-13 (×2): qty 1500

## 2019-01-13 MED ORDER — CYCLOBENZAPRINE HCL 10 MG PO TABS
10.0000 mg | ORAL_TABLET | Freq: Three times a day (TID) | ORAL | Status: DC | PRN
  Administered 2019-01-13 – 2019-01-17 (×3): 10 mg via ORAL
  Filled 2019-01-13 (×3): qty 1

## 2019-01-13 MED ORDER — BISACODYL 5 MG PO TBEC: 5.0000 mg | DELAYED_RELEASE_TABLET | Freq: Every day | ORAL | Status: DC | PRN

## 2019-01-13 MED ORDER — LORATADINE 10 MG PO TABS
10.0000 mg | ORAL_TABLET | Freq: Every day | ORAL | Status: DC
  Administered 2019-01-13 – 2019-01-18 (×6): 10 mg via ORAL
  Filled 2019-01-13 (×6): qty 1

## 2019-01-13 MED ORDER — AMLODIPINE BESYLATE 5 MG PO TABS
5.0000 mg | ORAL_TABLET | Freq: Every day | ORAL | Status: DC
  Administered 2019-01-13 – 2019-01-18 (×6): 5 mg via ORAL
  Filled 2019-01-13 (×6): qty 1

## 2019-01-13 MED ORDER — IPRATROPIUM BROMIDE 0.02 % IN SOLN
0.5000 mg | Freq: Four times a day (QID) | RESPIRATORY_TRACT | Status: DC | PRN
  Administered 2019-01-16: 0.5 mg via RESPIRATORY_TRACT
  Filled 2019-01-13: qty 2.5

## 2019-01-13 MED ORDER — ALBUTEROL SULFATE (2.5 MG/3ML) 0.083% IN NEBU: 2.5000 mg | INHALATION_SOLUTION | Freq: Four times a day (QID) | RESPIRATORY_TRACT | Status: DC | PRN

## 2019-01-13 MED ORDER — MAGNESIUM SULFATE 2 GM/50ML IV SOLN
2.0000 g | Freq: Once | INTRAVENOUS | Status: AC
  Administered 2019-01-13: 2 g via INTRAVENOUS
  Filled 2019-01-13: qty 50

## 2019-01-13 MED ORDER — ONDANSETRON HCL 4 MG PO TABS: 4.0000 mg | ORAL_TABLET | Freq: Four times a day (QID) | ORAL | Status: DC | PRN

## 2019-01-13 MED ORDER — MONTELUKAST SODIUM 10 MG PO TABS
10.0000 mg | ORAL_TABLET | Freq: Every day | ORAL | Status: DC
  Administered 2019-01-13 – 2019-01-18 (×6): 10 mg via ORAL
  Filled 2019-01-13 (×6): qty 1

## 2019-01-13 MED ORDER — SENNOSIDES-DOCUSATE SODIUM 8.6-50 MG PO TABS: 1.0000 | ORAL_TABLET | Freq: Every evening | ORAL | Status: DC | PRN

## 2019-01-13 MED ORDER — ARIPIPRAZOLE 5 MG PO TABS
5.0000 mg | ORAL_TABLET | Freq: Every day | ORAL | Status: DC
  Administered 2019-01-13 – 2019-01-18 (×6): 5 mg via ORAL
  Filled 2019-01-13 (×6): qty 1

## 2019-01-13 MED ORDER — PREDNISONE 20 MG PO TABS
40.0000 mg | ORAL_TABLET | Freq: Every day | ORAL | Status: DC
  Administered 2019-01-13 – 2019-01-16 (×4): 40 mg via ORAL
  Filled 2019-01-13 (×4): qty 2

## 2019-01-13 MED ORDER — LEVALBUTEROL HCL 0.63 MG/3ML IN NEBU
0.6300 mg | INHALATION_SOLUTION | Freq: Three times a day (TID) | RESPIRATORY_TRACT | Status: DC
  Administered 2019-01-13 – 2019-01-18 (×15): 0.63 mg via RESPIRATORY_TRACT
  Filled 2019-01-13 (×15): qty 3

## 2019-01-13 NOTE — Progress Notes (Signed)
PROGRESS NOTE  Anna Lucero RJJ:[id number] DOB: 11/25/1975 DOA: 01/12/2019 PCP: Precious Gilding, PA  HPI/Recap of past 24 hours: HPI from Dr Ara Kussmaul Anna Lucero is a 43 y.o. female with a known history of sarcoidosis, bronchitis, GERD, rheumatoid arthritis, depression, anxiety and polycystic ovary syndrome presents to the emergency department for evaluation of shortness of breath.  Patient was in a usual state of health until May 31 when she was hospitalized for Sirs with an unclear source.  She was discharged home with 3 days of amoxicillin which she was compliant. Pt has had a 90-month history of cough associated with shortness of breath but states that her symptoms have become progressively worse since her discharge.  She now complains of fevers, chills, cough, shortness of breath.There has been no recent travel or sick contacts, or weight loss, night sweats. 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 She has been tested for covid twice, with negative results. Of note, pt CTA chest on 2 occassions have shown suspicion of sarcoidosis Vs lymphoma. Pt had a supraclavicular LN biopsy on 12/31/18 which was somewhat inconclusive but ??r/o lymphoma. Pt is scheduled to follow up with pulmonary as an outpt. In the ED, pt was noted to be febrile, tachycardic, and saturating well on RA. Covid negative. Pt was started on cefepime and Vanco for healthcare associated pneumonia. Pt admitted for further management.   Today, pt still coughing, with SOB. Denies any chest pain, abdominal pain, N/V/D.  Assessment/Plan: Active Problems:   Sepsis ([place])  Sepsis likely 2/2 ??HCAP Vs viral PNA with ??underlying ?sarcoidosis Febrile within 24hrs, no leukocytosis LA WNL, procalcitonin negative BC X 2 pending COVID X 2 negative (LDH 213, ferritin 40, CRP 2.3, d-dimer 0.85, fibrinogen 562 Resp viral panel pending UA negative, UC pending CTA chest neg for PE,  with persistent mediastinal adenopathy and scattered bilateral pulmonary opacities most consistent with sarcoidosis Continue cefepime and Vanco Continue O2, nebs as needed, singular, tessalon Pulmonary consulted, appreciate recs Monitor closely  Hypomagnesemia Replace PRN  ?Recent diagnosis of sarcoidosis, r/o lymphoma CTA chest showed possible sarcoidosis versus lymphoma Supraclavicular lymph node biopsy done on 12/31/2018 somewhat ruled out lymphoma, but inconclusive Angiotensin-converting enzyme was WNL Pulmonary consulted for possible bronchoscopy  HTN Continue Norvasc  History of rheumatoid arthritis Held methotrexate due to sepsis  History of depression, anxiety Continue Abilify, hold doxepin  History of GERD Continue Protonix  History of ?bronchitis Continue Allegra, Singulair, hold flonase  Obesity Lifestyle modification advised         Malnutrition Type:      Malnutrition Characteristics:      Nutrition Interventions:       Estimated body mass index is 39.32 kg/m as calculated from the following:   Height as of this encounter: 5\' 2"  (1.575 m).   Weight as of this encounter: 97.5 kg.     Code Status: Full  Family Communication: None at bedside  Disposition Plan: Home once work-up and management is complete   Consultants:  Pulmonology  Procedures:  None  Antimicrobials:  Cefepime  Vancomycin  DVT prophylaxis: Lovenox   Objective: Vitals:   01/13/19 0311 01/13/19 0314 01/13/19 0430 01/13/19 0607  BP: (!) 150/97 (!) 142/99    Pulse: (!) 125 (!) 123    Resp:      Temp: (!) 102.9 F (39.4 C)  (!) 100.8 F (38.2 C) 99 F (37.2 C)  TempSrc: Oral  Oral  SpO2: 100% 100%    Weight:      Height:        Intake/Output Summary (Last 24 hours) at 01/13/2019 1124 Last data filed at 01/13/2019 0604 Gross per 24 hour  Intake 2942.01 ml  Output 300 ml  Net 2642.01 ml   Filed Weights   01/12/19 1900  Weight: 97.5 kg     Exam:  General: NAD   Cardiovascular: S1, S2 present  Respiratory: CTAB  Abdomen: Soft, nontender, nondistended, bowel sounds present  Musculoskeletal: No bilateral pedal edema noted  Skin: Normal  Psychiatry: Normal mood    Data Reviewed: CBC: Recent Labs  Lab 01/12/19 1853 01/13/19 0504  WBC 10.4 8.9  NEUTROABS 7.5  --   HGB 14.3 12.5  HCT 45.9 41.9  MCV 89.3 91.1  PLT 369 956   Basic Metabolic Panel: Recent Labs  Lab 01/12/19 1853 01/13/19 0504  NA 138 138  K 3.7 3.6  CL 105 111  CO2 23 22  GLUCOSE 101* 114*  BUN 11 8  CREATININE 0.72 0.70  CALCIUM 9.1 8.4*  MG  --  1.6*  PHOS  --  3.0   GFR: Estimated Creatinine Clearance: 98.9 mL/min (by C-G formula based on SCr of 0.7 mg/dL). Liver Function Tests: Recent Labs  Lab 01/12/19 1853 01/13/19 0504  AST 26 18  ALT 25 20  ALKPHOS 99 75  BILITOT 0.2* 0.3  PROT 8.9* 7.1  ALBUMIN 3.8 3.0*   No results for input(s): LIPASE, AMYLASE in the last 168 hours. No results for input(s): AMMONIA in the last 168 hours. Coagulation Profile: No results for input(s): INR, PROTIME in the last 168 hours. Cardiac Enzymes: Recent Labs  Lab 01/12/19 1853  TROPONINI <0.03   BNP (last 3 results) No results for input(s): PROBNP in the last 8760 hours. HbA1C: No results for input(s): HGBA1C in the last 72 hours. CBG: No results for input(s): GLUCAP in the last 168 hours. Lipid Profile: Recent Labs    01/12/19 1853  TRIG 119   Thyroid Function Tests: No results for input(s): TSH, T4TOTAL, FREET4, T3FREE, THYROIDAB in the last 72 hours. Anemia Panel: Recent Labs    01/12/19 1853  FERRITIN 40   Urine analysis:    Component Value Date/Time   COLORURINE YELLOW 01/12/2019 1852   APPEARANCEUR HAZY (A) 01/12/2019 1852   LABSPEC 1.019 01/12/2019 1852   PHURINE 5.0 01/12/2019 1852   GLUCOSEU NEGATIVE 01/12/2019 1852   HGBUR NEGATIVE 01/12/2019 1852   BILIRUBINUR NEGATIVE 01/12/2019 1852   KETONESUR  NEGATIVE 01/12/2019 1852   PROTEINUR NEGATIVE 01/12/2019 1852   NITRITE NEGATIVE 01/12/2019 1852   LEUKOCYTESUR NEGATIVE 01/12/2019 1852   Sepsis Labs: @LABRCNTIP (procalcitonin:4,lacticidven:4)  ) Recent Results (from the past 240 hour(s))  Blood culture (routine x 2)     Status: None (Preliminary result)   Collection Time: 01/12/19  6:52 PM   Specimen: BLOOD  Result Value Ref Range Status   Specimen Description   Final    BLOOD RIGHT ANTECUBITAL Performed at [facility], [place] [address] [place], [place] [address]    Special Requests   Final    BOTTLES DRAWN AEROBIC AND ANAEROBIC Blood Culture adequate volume Performed at [facility] [address], [place], [place] [address]    Culture   Final    NO GROWTH < 12 HOURS Performed at [facility] [address] [place], [place] [address]    Report Status PENDING  Incomplete  SARS Coronavirus  2 [facility] order, Performed in [facility] hospital lab)     Status: None   Collection Time: 01/12/19  6:53 PM   Specimen: Nasopharyngeal Swab  Result Value Ref Range Status   SARS Coronavirus 2 NEGATIVE NEGATIVE Final    Comment: (NOTE) If result is NEGATIVE SARS-CoV-2 target nucleic acids are NOT DETECTED. The SARS-CoV-2 RNA is generally detectable in upper and lower  respiratory specimens during the acute phase of infection. The lowest  concentration of SARS-CoV-2 viral copies this assay can detect is 250  copies / mL. A negative result does not preclude SARS-CoV-2 infection  and should not be used as the sole basis for treatment or other  patient management decisions.  A negative result may occur with  improper specimen collection / handling, submission of specimen other  than nasopharyngeal swab, presence of viral mutation(s) within the  areas targeted by this assay, and inadequate number of viral copies  (<250 copies / mL). A negative result must be combined with clinical  observations, patient  history, and epidemiological information. If result is POSITIVE SARS-CoV-2 target nucleic acids are DETECTED. The SARS-CoV-2 RNA is generally detectable in upper and lower  respiratory specimens dur ing the acute phase of infection.  Positive  results are indicative of active infection with SARS-CoV-2.  Clinical  correlation with patient history and other diagnostic information is  necessary to determine patient infection status.  Positive results do  not rule out bacterial infection or co-infection with other viruses. If result is PRESUMPTIVE POSTIVE SARS-CoV-2 nucleic acids MAY BE PRESENT.   A presumptive positive result was obtained on the submitted specimen  and confirmed on repeat testing.  While 2019 novel coronavirus  (SARS-CoV-2) nucleic acids may be present in the submitted sample  additional confirmatory testing may be necessary for epidemiological  and / or clinical management purposes  to differentiate between  SARS-CoV-2 and other Sarbecovirus currently known to infect humans.  If clinically indicated additional testing with an alternate test  methodology [phone]) is advised. The SARS-CoV-2 RNA is generally  detectable in upper and lower respiratory sp ecimens during the acute  phase of infection. The expected result is Negative. Fact Sheet for Patients:  StrictlyIdeas.no Fact Sheet for Healthcare Providers: BankingDealers.co.za This test is not yet approved or cleared by the [place] FDA and has been authorized for detection and/or diagnosis of SARS-CoV-2 by FDA under an Emergency Use Authorization (EUA).  This EUA will remain in effect (meaning this test can be used) for the duration of the COVID-19 declaration under Section 564(b)(1) of the Act, 21 U.S.C. section 360bbb-3(b)(1), unless the authorization is terminated or revoked sooner. Performed at [facility], [place] [address], [place],  [place] [address]   Blood culture (routine x 2)     Status: None (Preliminary result)   Collection Time: 01/12/19  6:57 PM   Specimen: BLOOD  Result Value Ref Range Status   Specimen Description   Final    BLOOD LEFT ANTECUBITAL Performed at [facility], [place] [address] [place], [place] [address]    Special Requests   Final    BOTTLES DRAWN AEROBIC AND ANAEROBIC Blood Culture adequate volume Performed at [place] [address] [place], [place] [address]    Culture   Final    NO GROWTH < 12 HOURS Performed at [facility] [address] [place], [place] [address]    Report Status PENDING  Incomplete  Studies: Ct Angio Chest Pe W/cm &/or Wo Cm  Result Date: 01/12/2019 CLINICAL DATA:  Shortness of breath EXAM: CT ANGIOGRAPHY CHEST WITH CONTRAST TECHNIQUE: Multidetector CT imaging of the chest was performed using the standard protocol during bolus administration of intravenous contrast. Multiplanar CT image reconstructions and MIPs were obtained to evaluate the vascular anatomy. CONTRAST:  134mL OMNIPAQUE IOHEXOL 350 MG/ML SOLN COMPARISON:  12/30/2018 FINDINGS: Cardiovascular: Evaluation is significantly limited by motion artifact. Given this limitation no definite PE was identified. The heart size is stable from prior study. There is no pericardial effusion. There is significant narrowing of the right upper lobe pulmonary artery which is stable from prior study. Mediastinum/Nodes: Again identified are enlarged mediastinal and hilar lymph nodes. There are enlarged right supraclavicular lymph nodes. There is a small left-sided thyroid nodule. No significant axillary adenopathy. Lungs/Pleura: The lung volumes are low. Again noted are scattered pulmonary opacities bilaterally. There is significant narrowing of the bilateral lower lobe bronchi secondary to mass effect from the nearby adenopathy. There is no pneumothorax. Upper Abdomen: No acute abnormality.  Musculoskeletal: There is an old healed right-sided rib fracture. Review of the MIP images confirms the above findings. IMPRESSION: 1. Examination limited by motion artifact. 2. No definite PE given the limitations described above. 3. Persistent mediastinal adenopathy and scattered bilateral pulmonary opacities is again most consistent with sarcoidosis. The patient's adenopathy causes significant narrowing of the right upper lobe pulmonary artery as well as mild to moderate narrowing of the bilateral lower lobe bronchi. Electronically Signed   By: Constance Holster M.D.   On: 01/12/2019 23:57   Dg Chest Portable 1 View  Result Date: 01/12/2019 CLINICAL DATA:  Cough and shortness of breath EXAM: PORTABLE CHEST 1 VIEW COMPARISON:  Chest x-ray dated 12/30/2018 FINDINGS: Heart size is normal. Again identified is bilateral hilar adenopathy. There is no pneumothorax. No large pleural effusion. No focal area of consolidation. IMPRESSION: Stable appearance of the chest with no active disease. Electronically Signed   By: Constance Holster M.D.   On: 01/12/2019 20:18    Scheduled Meds:  amLODipine  5 mg Oral Daily   ARIPiprazole  5 mg Oral Daily   enoxaparin (LOVENOX) injection  40 mg Subcutaneous L93X   folic acid  1 mg Oral Daily   loratadine  10 mg Oral Daily   montelukast  10 mg Oral Daily   pantoprazole  40 mg Oral Daily    Continuous Infusions:  ceFEPime (MAXIPIME) IV 2 g (01/13/19 0604)   vancomycin       LOS: 0 days     Alma Friendly, MD [facility]  If 7PM-7AM, please contact night-coverage [email or web address] 01/13/2019, 11:24 AM

## 2019-01-13 NOTE — Consult Note (Signed)
Name: Anna Lucero MRN: [id number] DOB: Jan 12, 1976    ADMISSION DATE:  01/12/2019 CONSULTATION DATE:  01/13/2019  REFERRING MD :  Alta Corning  CHIEF COMPLAINT: Cough, fever, shortness of breath  HISTORY OF PRESENT ILLNESS: 43 year old never smoker who was initially hospitalized 5/31 with 2 to 3 months of cough and fevers, COVID testing was negative.  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  She denies any other risk factors for COVID's or sick contacts.  CT angiogram showed mediastinal lymphadenopathy and bilateral pulmonary opacities and a question of sarcoidosis was raised.  IR was consulted and she underwent biopsy of right supraclavicular lymph node.  She was treated and discharged with antibiotics and was to follow-up outpatient for biopsy results. She developed fevers and worsening cough and shortness of breath and was readmitted 6/13.  Again cover testing was negative.  Again CT angiogram was performed which showed persistent mediastinal lymphadenopathy.  CT abdomen was negative for any other lymphadenopathy  She reports a history of rheumatoid arthritis since age 25 for which she is undergone numerous treatments including prednisone, methotrexate.  She has just been started on Remicade for 6 months with some relief and she follows up with rheumatology at [facility].  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  Arthritis is mostly in her feet and hands.  She reports papular lesions on her skin along her tattoo marks Prednisone caused weight gain, hypertension  SIGNIFICANT EVENTS  6/1 ultrasound-guided biopsy of right supraclavicular lymph node  STUDIES:  CT angiogram 6/13 5/31 CT angiogram negative for PE, mediastinal lymphadenopathy 6/1 CT abdomen negative for lymph nodes    PAST MEDICAL HISTORY :   has a past medical history of Anemia, Anxiety, Bronchitis, Depression, GERD (gastroesophageal reflux disease), Gestational diabetes,  Iritis, PCOS (polycystic ovarian syndrome), and RA (rheumatoid arthritis) ([place]).  has a past surgical history that includes abdominal plasty; filopian tube removal; Breast reduction surgery; Dilation and evacuation (N/A, 08/09/2017); Dilation and curettage of uterus; and Reduction mammaplasty (Bilateral). Prior to Admission medications   Medication Sig Start Date End Date Taking? Authorizing Provider  acetaminophen (TYLENOL) 500 MG tablet Take 1,000 mg by mouth every 6 (six) hours as needed for moderate pain.   Yes [provider]  albuterol (PROVENTIL) (2.5 MG/3ML) 0.083% nebulizer solution Take 3 mLs by nebulization every 6 (six) hours as needed for wheezing or shortness of breath. 10/15/18  Yes [provider]  Albuterol Sulfate (PROAIR RESPICLICK) 790 (90 Base) MCG/ACT AEPB Inhale 2 puffs into the lungs every 6 (six) hours as needed (shortness of breath).  11/30/18  Yes [provider]  ARIPiprazole (ABILIFY) 5 MG tablet Take 1 tablet (5 mg total) by mouth daily. 10/25/18 10/25/19 Yes Arfeen, Arlyce Harman, MD  beclomethasone (QVAR) 40 MCG/ACT inhaler Inhale 1 puff into the lungs 2 (two) times daily.   Yes [provider]  diclofenac (VOLTAREN) 75 MG EC tablet Take 150 mg by mouth 2 (two) times a day. 12/07/18  Yes [provider]  doxepin (SINEQUAN) 25 MG capsule Take 1 capsule (25 mg total) by mouth at bedtime as needed (insomnia). 10/25/18  Yes Arfeen, Arlyce Harman, MD  inFLIXimab (REMICADE IV) Inject into the vein every 6 (six) weeks.    Yes [provider]  methotrexate (RHEUMATREX) 2.5 MG tablet Take 20 mg by mouth every Saturday. 12/07/18  Yes [provider]  omeprazole (PRILOSEC) 40 MG capsule Take 40 mg by  mouth daily.   Yes [provider]  predniSONE (DELTASONE) 5 MG tablet Take 5-10 mg by mouth See admin instructions. Take 1-2 tablets daily as needed for flare   Yes [provider]  amLODipine (NORVASC) 5 MG tablet Take 1 tablet  (5 mg total) by mouth daily. Patient not taking: Reported on 01/13/2019 01/02/19   Shelly Coss, MD   Allergies  Allergen Reactions  . 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 Celebrex [Celecoxib] Other (See Comments)    Chest pain  . Spironolactone Other (See Comments)    Bleeding    FAMILY HISTORY:  family history includes Alcohol abuse in her father and mother; Anxiety disorder in her mother and sister; Bipolar disorder in her mother; Depression in her mother and sister; Drug abuse in her father and mother. SOCIAL HISTORY:  reports that she has never smoked. She has never used smokeless tobacco. She reports current alcohol use of about 3.0 standard drinks of alcohol per week. She reports that she does not use drugs.  REVIEW OF SYSTEMS:   Positive as above  Constitutional: Negative for weight loss HENT: Negative for hearing loss, ear pain, nosebleeds, congestion, sore throat, neck pain, tinnitus and ear discharge.   Eyes: Negative for blurred vision, double vision, photophobia, pain, discharge and redness.  Respiratory: Negative for  hemoptysis, sputum production and stridor.   Cardiovascular: Negative for chest pain, palpitations, orthopnea, claudication, leg swelling and PND.  Gastrointestinal: Negative for heartburn, nausea, vomiting, abdominal pain, diarrhea, constipation, blood in stool and melena.  Genitourinary: Negative for dysuria, urgency, frequency, hematuria and flank pain.  Musculoskeletal: Negative for myalgias, back pain, joint pain and falls.  Skin: Negative for itching and rash.  Neurological: Negative for dizziness, tingling, tremors, sensory change, speech change, focal weakness, seizures, loss of consciousness, weakness and headaches.  Endo/Heme/Allergies: Negative for environmental allergies and polydipsia. Does not bruise/bleed easily.  SUBJECTIVE:   VITAL SIGNS: Temp:  [99 F (37.2 C)-103 F (39.4 C)] 99 F (37.2 C) (06/14  0607) Pulse Rate:  [106-149] 123 (06/14 0314) Resp:  [13-35] 25 (06/14 0230) BP: (114-187)/(73-129) 142/99 (06/14 0314) SpO2:  [94 %-100 %] 100 % (06/14 0314) Weight:  [97.5 kg] 97.5 kg (06/13 1900)  PHYSICAL EXAMINATION: General: No distress, able to lie supine, obese woman Neuro: Alert and interactive, normal affect HEENT: No pallor, icterus Cardiovascular: S1-S2 normal Lungs: Decreased breath sounds bilateral Abdomen: Soft, nontender Musculoskeletal: No deformity, no joint tenderness Skin: Papular lesions over tattoos  Recent Labs  Lab 01/12/19 1853 01/13/19 0504  NA 138 138  K 3.7 3.6  CL 105 111  CO2 23 22  BUN 11 8  CREATININE 0.72 0.70  GLUCOSE 101* 114*   Recent Labs  Lab 01/12/19 1853 01/13/19 0504  HGB 14.3 12.5  HCT 45.9 41.9  WBC 10.4 8.9  PLT 369 309   Ct Angio Chest Pe W/cm &/or Wo Cm  Result Date: 01/12/2019 CLINICAL DATA:  Shortness of breath EXAM: CT ANGIOGRAPHY CHEST WITH CONTRAST TECHNIQUE: Multidetector CT imaging of the chest was performed using the standard protocol during bolus administration of intravenous contrast. Multiplanar CT image reconstructions and MIPs were obtained to evaluate the vascular anatomy. CONTRAST:  138mL OMNIPAQUE IOHEXOL 350 MG/ML SOLN COMPARISON:  12/30/2018 FINDINGS: Cardiovascular: Evaluation is significantly limited by motion artifact. Given this limitation no definite PE was identified. The heart size is stable from prior study. There is no pericardial effusion. There is significant narrowing of  the right upper lobe pulmonary artery which is stable from prior study. Mediastinum/Nodes: Again identified are enlarged mediastinal and hilar lymph nodes. There are enlarged right supraclavicular lymph nodes. There is a small left-sided thyroid nodule. No significant axillary adenopathy. Lungs/Pleura: The lung volumes are low. Again noted are scattered pulmonary opacities bilaterally. There is significant narrowing of the bilateral  lower lobe bronchi secondary to mass effect from the nearby adenopathy. There is no pneumothorax. Upper Abdomen: No acute abnormality. Musculoskeletal: There is an old healed right-sided rib fracture. Review of the MIP images confirms the above findings. IMPRESSION: 1. Examination limited by motion artifact. 2. No definite PE given the limitations described above. 3. Persistent mediastinal adenopathy and scattered bilateral pulmonary opacities is again most consistent with sarcoidosis. The patient's adenopathy causes significant narrowing of the right upper lobe pulmonary artery as well as mild to moderate narrowing of the bilateral lower lobe bronchi. Electronically Signed   By: Constance Holster M.D.   On: 01/12/2019 23:57   Dg Chest Portable 1 View  Result Date: 01/12/2019 CLINICAL DATA:  Cough and shortness of breath EXAM: PORTABLE CHEST 1 VIEW COMPARISON:  Chest x-ray dated 12/30/2018 FINDINGS: Heart size is normal. Again identified is bilateral hilar adenopathy. There is no pneumothorax. No large pleural effusion. No focal area of consolidation. IMPRESSION: Stable appearance of the chest with no active disease. Electronically Signed   By: Constance Holster M.D.   On: 01/12/2019 20:18    ASSESSMENT / PLAN:  Combination of mediastinal lymphadenopathy, pulmonary opacities and uveitis make sarcoidosis most likely.  ACE level is slightly elevated. Biopsy of right supraclavicular lymph nodes shows granulomas which would be consistent I do not think we have to be concerned about lymphoma here-pulmonary parenchymal involvement would be quite uncommon with lymphoma.  She is already on methotrexate and Remicade for rheumatoid arthritis I think she will need prednisone.  I discussed this with the patient, she is aware and concerned about side effects.  We discussed that she may need low-dose suppression therapy for sarcoidosis up to 6 to 12 weeks. I will start her at 40 mg and cut her down to 20 mg by  the time of discharge from the hospital and maintain her on this dose if tolerated for 4 to 6 weeks before tapering gradually to off or to lowest tolerated dose. If for some reason she does not tolerate, may need steroid sparing agents  Kara Mead MD. FCCP. [facility] Pager [phone] If no response call [phone]    01/13/2019, 12:42 PM

## 2019-01-13 NOTE — Progress Notes (Signed)
Pharmacy: Re- methotrexate  Patient's a 43 y.o F with hx RA on MTX PTA.  Methotrexate (Trexall; Rheumatrex) hold criteria  Hgb < 8  WBC < 3  Pltc < 100K  SCr > 1.5x baseline (or > 2 if baseline unknown)  AST or ALT >3x ULN  Bili > 1.5x ULN  Ascites or pleural effusion  Diarrhea - Grade 2 or higher  Ulcerative stomatitis  Unexplained pneumonitis / hypoxemia  Active infection (currently on cefepime and vancomycin for sepsis/PNA)  Plan:  - hold methotrexate for now per P&T policy d/t active infection  Dia Sitter, PharmD, BCPS 01/13/2019 10:10 AM

## 2019-01-13 NOTE — Progress Notes (Signed)
Pharmacy Antibiotic Note  Anna Lucero is a 43 y.o. female admitted on 01/12/2019 with pneumonia.  Pharmacy has been consulted for cefepime and vancomycin dosing.  Plan: Cefepime 2 Gm IV q8h Vancomycin 1500 mg IV q24h for est AUC = 481 Goal AUC = 400-550 F/u scr/cultures/levels  Height: 5\' 2"  (157.5 cm) Weight: 215 lb (97.5 kg) IBW/kg (Calculated) : 50.1  Temp (24hrs), Avg:102.9 F (39.4 C), Min:102.9 F (39.4 C), Max:102.9 F (39.4 C)  Recent Labs  Lab 01/12/19 1853  WBC 10.4  CREATININE 0.72  LATICACIDVEN 1.8    Estimated Creatinine Clearance: 98.9 mL/min (by C-G formula based on SCr of 0.72 mg/dL).    Allergies  Allergen Reactions  . 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 Celebrex [Celecoxib] Other (See Comments)    Chest pain  . Spironolactone Other (See Comments)    Bleeding    Antimicrobials this admission: 6/13 cefepime >>  6/13 vancomycin >>   Dose adjustments this admission:   Microbiology results:  BCx:   UCx:    Sputum:    MRSA PCR:   Thank you for allowing pharmacy to be a part of this patient's care.  Dorrene German 01/13/2019 1:13 AM

## 2019-01-13 NOTE — Evaluation (Signed)
Physical Therapy Evaluation Patient Details Name: Anna Lucero MRN: [id number] DOB: [birth year] Today's Date: 01/13/2019   History of Present Illness  Anna Lucero is a 43 y.o. female with a known history of sarcoidosis, bronchitis, GERD, rheumatoid arthritis, depression, anxiety and polycystic ovary syndrome presents to the emergency department for evaluation of shortness of breath.  Clinical Impression  Pt admitted with above diagnosis. Pt currently with functional limitations due to the deficits listed below (see PT Problem List). Pt will benefit from skilled PT to increase their independence and safety with mobility to allow discharge to the venue listed below.  Pt's HR elevated with minimal walking in room.  Did encourage pt to sit up throughout the day as tolerated.  Do not feel she will need any post acute PT or DME.     Follow Up Recommendations No PT follow up    Equipment Recommendations  None recommended by PT    Recommendations for Other Services       Precautions / Restrictions Precautions Precautions: Other (comment) Precaution Comments: monitor vitals Restrictions Weight Bearing Restrictions: No      Mobility  Bed Mobility Overal bed mobility: Modified Independent                Transfers Overall transfer level: Needs assistance   Transfers: Sit to/from Stand Sit to Stand: Supervision            Ambulation/Gait Ambulation/Gait assistance: Min guard Gait Distance (Feet): 20 Feet Assistive device: IV Pole Gait Pattern/deviations: Step-through pattern     General Gait Details: Patient ambulated around room and HR elevated to 130 with o2 98% on room air. Gait a bit stiff due to RA per pt,  Stairs            Wheelchair Mobility    Modified Rankin (Stroke Patients Only)       Balance Overall balance assessment: No apparent balance deficits (not formally assessed)                                            Pertinent Vitals/Pain Pain Assessment: No/denies pain(just had Tylenol)    Home Living Family/patient expects to be discharged to:: Private residence Living Arrangements: Spouse/significant other;Children Available Help at Discharge: Family;Available PRN/intermittently Type of Home: House Home Access: Stairs to enter Entrance Stairs-Rails: None Entrance Stairs-Number of Steps: 3 Home Layout: One level Home Equipment: None      Prior Function Level of Independence: Independent         Comments: Works as Agricultural engineer        Extremity/Trunk Assessment   Upper Extremity Assessment Upper Extremity Assessment: Overall WFL for tasks assessed    Lower Extremity Assessment Lower Extremity Assessment: Overall WFL for tasks assessed;Generalized weakness    Cervical / Trunk Assessment Cervical / Trunk Assessment: Normal  Communication   Communication: No difficulties  Cognition Arousal/Alertness: Awake/alert Behavior During Therapy: WFL for tasks assessed/performed Overall Cognitive Status: Within Functional Limits for tasks assessed                                        General Comments      Exercises     Assessment/Plan    PT Assessment Patient needs continued PT services  PT Problem  List Cardiopulmonary status limiting activity;Decreased activity tolerance;Decreased balance;Decreased mobility       PT Treatment Interventions Gait training;Functional mobility training;Stair training;Therapeutic exercise;Therapeutic activities    PT Goals (Current goals can be found in the Care Plan section)  Acute Rehab PT Goals Patient Stated Goal: get HR down PT Goal Formulation: With patient Time For Goal Achievement: 01/27/19 Potential to Achieve Goals: Good    Frequency Min 3X/week   Barriers to discharge        Co-evaluation               AM-PAC PT "6 Clicks" Mobility  Outcome Measure Help needed turning from your back  to your side while in a flat bed without using bedrails?: None Help needed moving from lying on your back to sitting on the side of a flat bed without using bedrails?: None Help needed moving to and from a bed to a chair (including a wheelchair)?: None Help needed standing up from a chair using your arms (e.g., wheelchair or bedside chair)?: A Little Help needed to walk in hospital room?: A Little Help needed climbing 3-5 steps with a railing? : A Little 6 Click Score: 21    End of Session Equipment Utilized During Treatment: Gait belt Activity Tolerance: Treatment limited secondary to medical complications (Comment)(tachycardia) Patient left: in chair;with call bell/phone within reach Nurse Communication: Mobility status PT Visit Diagnosis: Other abnormalities of gait and mobility (R26.89)    Time: 5520-8022 PT Time Calculation (min) (ACUTE ONLY): 22 min   Charges:   PT Evaluation $PT Eval Moderate Complexity: 1 Mod          Duaa Stelzner L. Tamala Julian, [place] Pager [phone] 01/13/2019   Galen Manila 01/13/2019, 2:08 PM

## 2019-01-13 NOTE — H&P (Signed)
History and Physical   [facility] [place] [facility] [place] Admission History and Physical [employer], D.O.    Patient Name: Anna Lucero MR#: [id number] Date of Birth: 1976/01/15 Date of Admission: 01/12/2019  Referring MD/NP/PA: PA Britni Henderly Primary Care Physician: Precious Gilding, PA  Chief Complaint:  Chief Complaint  Patient presents with  . Shortness of Breath  . Cough    HPI: Anna Lucero is a 43 y.o. female with a known history of sarcoidosis, COPD, GERD, rheumatoid arthritis, depression, anxiety and polycystic ovary syndrome presents to the emergency department for evaluation of shortness of breath.  Patient was in a usual state of health until May 31 when she was hospitalized for Sirs with an unclear source.  She was discharged home with 3 days of amoxicillin which she was compliant  She has had a 82-month history of cough associated with shortness of breath but states that her symptoms have become progressively worse since her discharge.  She now complains of fevers, chills, cough, shortness of breath.  Of note patient has a recent diagnosis of sarcoidosis but has not yet established care with pulmonary  Patient denies fevers/chills, weakness, dizziness,, N/V/C/D, abdominal pain, dysuria/frequency, changes in mental status.   Patient has been taking medication as prescribed and there has been no recent change in medication or diet.  No recent antibiotics.  There has been no recent travel or sick contacts.  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  EMS/ED Course: Patient received cefepime and Vanco. Medical admission has been requested for further management of sepsis, unclear source, COVID negative will be treating as healthcare associated pneumonia given symptomatology.  Review of Systems:  CONSTITUTIONAL: Positive fever/chills, fatigue, weakness, negative weight gain/loss, headache. EYES: No blurry or double vision. ENT: No  tinnitus, postnasal drip, redness or soreness of the oropharynx. RESPIRATORY: Positive cough, dyspnea, negative wheeze.  No hemoptysis.  CARDIOVASCULAR: No chest pain, palpitations, syncope, orthopnea. No lower extremity edema.  GASTROINTESTINAL: No nausea, vomiting, abdominal pain, diarrhea, constipation.  No hematemesis, melena or hematochezia. GENITOURINARY: No dysuria, frequency, hematuria. ENDOCRINE: No polyuria or nocturia. No heat or cold intolerance. HEMATOLOGY: No anemia, bruising, bleeding. INTEGUMENTARY: No rashes, ulcers, lesions. MUSCULOSKELETAL: No arthritis, gout, NEUROLOGIC: No numbness, tingling, ataxia, seizure-type activity, weakness. PSYCHIATRIC: No anxiety, depression, insomnia.   Past Medical History:  Diagnosis Date  . Anemia   . Anxiety   . Bronchitis   . Depression   . GERD (gastroesophageal reflux disease)   . Gestational diabetes   . Iritis   . PCOS (polycystic ovarian syndrome)   . RA (rheumatoid arthritis) ([place])     Past Surgical History:  Procedure Laterality Date  . abdominal plasty    . BREAST REDUCTION SURGERY    . DILATION AND CURETTAGE OF UTERUS    . DILATION AND EVACUATION N/A 08/09/2017   Procedure: DILATATION AND EVACUATION;  Surgeon: Allyn Kenner, DO;  Location: [facility];  Service: Gynecology;  Laterality: N/A;  . filopian tube removal    . REDUCTION MAMMAPLASTY Bilateral      reports that she has never smoked. She has never used smokeless tobacco. She reports current alcohol use of about 3.0 standard drinks of alcohol per week. She reports that she does not use drugs.  Allergies  Allergen Reactions  . 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 Celebrex [Celecoxib] Other (See Comments)    Chest pain  . Spironolactone Other (See  Comments)    Bleeding    Family History  Problem Relation Age of Onset  . Alcohol abuse Mother   . Drug abuse Mother   . Bipolar disorder Mother   . Anxiety disorder Mother    . Depression Mother   . Alcohol abuse Father   . Drug abuse Father   . Anxiety disorder Sister   . Depression Sister   . Breast cancer Neg Hx     Prior to Admission medications   Medication Sig Start Date End Date Taking? Authorizing Provider  acetaminophen (TYLENOL) 500 MG tablet Take 1,000 mg by mouth every 6 (six) hours as needed for moderate pain.    [provider]  Albuterol Sulfate (PROAIR RESPICLICK) 500 (90 Base) MCG/ACT AEPB Inhale 2 puffs into the lungs every 6 (six) hours as needed (shortness of breath).  11/30/18   [provider]  amLODipine (NORVASC) 5 MG tablet Take 1 tablet (5 mg total) by mouth daily. 01/02/19   Shelly Coss, MD  ARIPiprazole (ABILIFY) 5 MG tablet Take 1 tablet (5 mg total) by mouth daily. 10/25/18 10/25/19  Arfeen, Arlyce Harman, MD  beclomethasone (QVAR) 40 MCG/ACT inhaler Inhale 1 puff into the lungs 2 (two) times daily.    [provider]  citalopram (CELEXA) 40 MG tablet Take 1 tablet (40 mg total) by mouth daily. Patient not taking: Reported on 12/30/2018 10/25/18 10/25/19  Arfeen, Arlyce Harman, MD  cyclobenzaprine (FLEXERIL) 10 MG tablet Take 10 mg by mouth 3 (three) times daily as needed for muscle spasms.  09/04/18   [provider]  diclofenac (VOLTAREN) 75 MG EC tablet Take 150 mg by mouth 2 (two) times a day. 12/07/18   [provider]  doxepin (SINEQUAN) 25 MG capsule Take 1 capsule (25 mg total) by mouth at bedtime as needed (insomnia). 10/25/18   Arfeen, Arlyce Harman, MD  fexofenadine (ALLEGRA) 180 MG tablet Take 180 mg by mouth daily. 11/30/18   [provider]  fluticasone (FLONASE) 50 MCG/ACT nasal spray Place 1 spray into the nose 2 (two) times a day. 11/30/18 11/30/19  [provider]  folic acid (FOLVITE) 1 MG tablet Take 1 mg by mouth daily.    [provider]  inFLIXimab (REMICADE IV) Inject into the vein every 6 (six) weeks.     [provider]  methotrexate (RHEUMATREX) 2.5 MG tablet  Take 20 mg by mouth every Saturday. 12/07/18   [provider]  montelukast (SINGULAIR) 10 MG tablet Take 10 mg by mouth daily.    [provider]  Multiple Vitamins-Minerals (EMERGEN-C IMMUNE) PACK Take 1 Package by mouth 3 (three) times a week.    [provider]  Multiple Vitamins-Minerals (MULTIVITAMIN GUMMIES ADULT) CHEW Chew 2 each by mouth daily.    [provider]  omeprazole (PRILOSEC) 40 MG capsule Take 40 mg by mouth daily.    [provider]  predniSONE (STERAPRED UNI-PAK 21 TAB) 10 MG (21) TBPK tablet Take 10-60 mg by mouth See admin instructions. Taper as directed on pack for 6 days Take 60 mg on day 1, 50 mg on day 2, 40 mg on day 3, 30 mg on day 4, 20 mg on day 5 and 10 mg on day 6    [provider]    Physical Exam: Vitals:   01/12/19 2122 01/12/19 2130 01/12/19 2241 01/13/19 0031  BP: (!) 144/104 127/74 140/83 (!) 166/129  Pulse: (!) 118 (!) 112 (!) 111 (!) 126  Resp: Marland Kitchen)  24 (!) 23 17 (!) 28  Temp:      TempSrc:      SpO2: 95% 94% 98% 100%  Weight:      Height:        GENERAL: 43 y.o.-year-old female patient, well-developed, well-nourished lying in the bed in no acute distress.  Pleasant and cooperative.  Mildly anxious HEENT: Head atraumatic, normocephalic. Pupils equal. Mucus membranes moist. NECK: Supple. No JVD. CHEST: Normal breath sounds bilaterally. No wheezing, rales, rhonchi or crackles. No use of accessory muscles of respiration.   CARDIOVASCULAR: S1, S2 normal. No murmurs, rubs, or gallops.  ABDOMEN: Soft, nondistended, nontender. No rebound, guarding, rigidity. Normoactive bowel sounds present in all four quadrants.  EXTREMITIES: No pedal edema, cyanosis, or clubbing. No calf tenderness or Homan's sign.  NEUROLOGIC: The patient is alert and oriented x 3. Cranial nerves II through XII are grossly intact with no focal sensorimotor deficit. PSYCHIATRIC:  Normal affect, mood, thought content. SKIN: Warm,  dry, and intact without obvious rash, lesion, or ulcer.    Labs on Admission:  CBC: Recent Labs  Lab 01/12/19 1853  WBC 10.4  NEUTROABS 7.5  HGB 14.3  HCT 45.9  MCV 89.3  PLT 211   Basic Metabolic Panel: Recent Labs  Lab 01/12/19 1853  NA 138  K 3.7  CL 105  CO2 23  GLUCOSE 101*  BUN 11  CREATININE 0.72  CALCIUM 9.1   GFR: Estimated Creatinine Clearance: 98.9 mL/min (by C-G formula based on SCr of 0.72 mg/dL). Liver Function Tests: Recent Labs  Lab 01/12/19 1853  AST 26  ALT 25  ALKPHOS 99  BILITOT 0.2*  PROT 8.9*  ALBUMIN 3.8   No results for input(s): LIPASE, AMYLASE in the last 168 hours. No results for input(s): AMMONIA in the last 168 hours. Coagulation Profile: No results for input(s): INR, PROTIME in the last 168 hours. Cardiac Enzymes: Recent Labs  Lab 01/12/19 1853  TROPONINI <0.03   BNP (last 3 results) No results for input(s): PROBNP in the last 8760 hours. HbA1C: No results for input(s): HGBA1C in the last 72 hours. CBG: No results for input(s): GLUCAP in the last 168 hours. Lipid Profile: Recent Labs    01/12/19 1853  TRIG 119   Thyroid Function Tests: No results for input(s): TSH, T4TOTAL, FREET4, T3FREE, THYROIDAB in the last 72 hours. Anemia Panel: Recent Labs    01/12/19 1853  FERRITIN 40   Urine analysis:    Component Value Date/Time   COLORURINE YELLOW 01/12/2019 1852   APPEARANCEUR HAZY (A) 01/12/2019 1852   LABSPEC 1.019 01/12/2019 1852   PHURINE 5.0 01/12/2019 1852   GLUCOSEU NEGATIVE 01/12/2019 1852   HGBUR NEGATIVE 01/12/2019 1852   BILIRUBINUR NEGATIVE 01/12/2019 1852   KETONESUR NEGATIVE 01/12/2019 1852   PROTEINUR NEGATIVE 01/12/2019 1852   NITRITE NEGATIVE 01/12/2019 1852   LEUKOCYTESUR NEGATIVE 01/12/2019 1852   Sepsis Labs: @LABRCNTIP (procalcitonin:4,lacticidven:4) ) Recent Results (from the past 240 hour(s))  SARS Coronavirus 2 [facility] order, Performed in [place] hospital lab)     Status:  None   Collection Time: 01/12/19  6:53 PM   Specimen: Nasopharyngeal Swab  Result Value Ref Range Status   SARS Coronavirus 2 NEGATIVE NEGATIVE Final    Comment: (NOTE) If result is NEGATIVE SARS-CoV-2 target nucleic acids are NOT DETECTED. The SARS-CoV-2 RNA is generally detectable in upper and lower  respiratory specimens during the acute phase of infection. The lowest  concentration of SARS-CoV-2 viral copies this assay can detect is 250  copies / mL. A negative result does not preclude SARS-CoV-2 infection  and should not be used as the sole basis for treatment or other  patient management decisions.  A negative result may occur with  improper specimen collection / handling, submission of specimen other  than nasopharyngeal swab, presence of viral mutation(s) within the  areas targeted by this assay, and inadequate number of viral copies  (<250 copies / mL). A negative result must be combined with clinical  observations, patient history, and epidemiological information. If result is POSITIVE SARS-CoV-2 target nucleic acids are DETECTED. The SARS-CoV-2 RNA is generally detectable in upper and lower  respiratory specimens dur ing the acute phase of infection.  Positive  results are indicative of active infection with SARS-CoV-2.  Clinical  correlation with patient history and other diagnostic information is  necessary to determine patient infection status.  Positive results do  not rule out bacterial infection or co-infection with other viruses. If result is PRESUMPTIVE POSTIVE SARS-CoV-2 nucleic acids MAY BE PRESENT.   A presumptive positive result was obtained on the submitted specimen  and confirmed on repeat testing.  While 2019 novel coronavirus  (SARS-CoV-2) nucleic acids may be present in the submitted sample  additional confirmatory testing may be necessary for epidemiological  and / or clinical management purposes  to differentiate between  SARS-CoV-2 and other  Sarbecovirus currently known to infect humans.  If clinically indicated additional testing with an alternate test  methodology [id number]) is advised. The SARS-CoV-2 RNA is generally  detectable in upper and lower respiratory sp ecimens during the acute  phase of infection. The expected result is Negative. Fact Sheet for Patients:  StrictlyIdeas.no Fact Sheet for Healthcare Providers: BankingDealers.co.za This test is not yet approved or cleared by the [place] FDA and has been authorized for detection and/or diagnosis of SARS-CoV-2 by FDA under an Emergency Use Authorization (EUA).  This EUA will remain in effect (meaning this test can be used) for the duration of the COVID-19 declaration under Section 564(b)(1) of the Act, 21 U.S.C. section 360bbb-3(b)(1), unless the authorization is terminated or revoked sooner. Performed at [facility], [place] [address], [place], [place] [address]      Radiological Exams on Admission: Ct Angio Chest Pe W/cm &/or Wo Cm  Result Date: 01/12/2019 CLINICAL DATA:  Shortness of breath EXAM: CT ANGIOGRAPHY CHEST WITH CONTRAST TECHNIQUE: Multidetector CT imaging of the chest was performed using the standard protocol during bolus administration of intravenous contrast. Multiplanar CT image reconstructions and MIPs were obtained to evaluate the vascular anatomy. CONTRAST:  169mL OMNIPAQUE IOHEXOL 350 MG/ML SOLN COMPARISON:  12/30/2018 FINDINGS: Cardiovascular: Evaluation is significantly limited by motion artifact. Given this limitation no definite PE was identified. The heart size is stable from prior study. There is no pericardial effusion. There is significant narrowing of the right upper lobe pulmonary artery which is stable from prior study. Mediastinum/Nodes: Again identified are enlarged mediastinal and hilar lymph nodes. There are enlarged right supraclavicular lymph nodes. There is a small  left-sided thyroid nodule. No significant axillary adenopathy. Lungs/Pleura: The lung volumes are low. Again noted are scattered pulmonary opacities bilaterally. There is significant narrowing of the bilateral lower lobe bronchi secondary to mass effect from the nearby adenopathy. There is no pneumothorax. Upper Abdomen: No acute abnormality. Musculoskeletal: There is an old healed right-sided rib fracture. Review of the MIP images confirms the above findings. IMPRESSION: 1. Examination limited by motion artifact. 2. No definite PE given the limitations described above.  3. Persistent mediastinal adenopathy and scattered bilateral pulmonary opacities is again most consistent with sarcoidosis. The patient's adenopathy causes significant narrowing of the right upper lobe pulmonary artery as well as mild to moderate narrowing of the bilateral lower lobe bronchi. Electronically Signed   By: Constance Holster M.D.   On: 01/12/2019 23:57   Dg Chest Portable 1 View  Result Date: 01/12/2019 CLINICAL DATA:  Cough and shortness of breath EXAM: PORTABLE CHEST 1 VIEW COMPARISON:  Chest x-ray dated 12/30/2018 FINDINGS: Heart size is normal. Again identified is bilateral hilar adenopathy. There is no pneumothorax. No large pleural effusion. No focal area of consolidation. IMPRESSION: Stable appearance of the chest with no active disease. Electronically Signed   By: Constance Holster M.D.   On: 01/12/2019 20:18     Assessment/Plan  This is a 43 y.o. female with a history of sarcoidosis, COPD, GERD, rheumatoid arthritis, depression, anxiety and polycystic ovary syndrome now being admitted with:  #.  Sepsis, unclear source however will treat and cover for HCAP, given immunocompromise, recent hospitalization.  High index of suspicion for COVID given healthcare worker with - Admit to inpatient with telemetry monitoring - IV antibiotics: Continue cefepime and Vanco - IV fluid hydration -O2, nebs as needed - Follow up  blood,urine & sputum cultures.  Will check CRP, LDH, ferritin - Consider repeat COVID testing -Consider pulmonary consultation given underlying medical conditions including COPD and sarcoidosis - Repeat CBC in am.   #.  History of hypertension -Continue Norvasc  #.  History of rheumatoid arthritis -Continue methotrexate  #. History of depression, anxiety - Continue Abilify, hold doxepin  #. History of GERD - Continue Protonix  #. History of COPD - Continue Allegra, Singulair - Hold Qvar, Flonase  Admission status: Inpatient, tele IV Fluids: HL Diet/Nutrition: Heart healthy Consults called: None  DVT Px: Lovenox, SCDs and early ambulation. Code Status: Full Code  Disposition Plan: To home in 2-3 days  All the records are reviewed and case discussed with ED provider. Management plans discussed with the patient and/or family who express understanding and agree with plan of care.  Jaiel Saraceno D.O. on 01/13/2019 at 12:40 AM CC: Primary care physician; Precious Gilding, [place]   01/13/2019, 12:40 AM

## 2019-01-14 DIAGNOSIS — J189 Pneumonia, unspecified organism: Secondary | ICD-10-CM

## 2019-01-14 LAB — CBC WITH DIFFERENTIAL/PLATELET
Abs Immature Granulocytes: 0.04 10*3/uL (ref 0.00–0.07)
Basophils Absolute: 0 10*3/uL (ref 0.0–0.1)
Basophils Relative: 0 %
Eosinophils Absolute: 0 10*3/uL (ref 0.0–0.5)
Eosinophils Relative: 1 %
HCT: 40.5 % (ref 36.0–46.0)
Hemoglobin: 12.2 g/dL (ref 12.0–15.0)
Immature Granulocytes: 1 %
Lymphocytes Relative: 19 %
Lymphs Abs: 1.5 10*3/uL (ref 0.7–4.0)
MCH: 27.1 pg (ref 26.0–34.0)
MCHC: 30.1 g/dL (ref 30.0–36.0)
MCV: 90 fL (ref 80.0–100.0)
Monocytes Absolute: 0.7 10*3/uL (ref 0.1–1.0)
Monocytes Relative: 10 %
Neutro Abs: 5.3 10*3/uL (ref 1.7–7.7)
Neutrophils Relative %: 69 %
Platelets: 337 10*3/uL (ref 150–400)
RBC: 4.5 MIL/uL (ref 3.87–5.11)
RDW: 13.2 % (ref 11.5–15.5)
WBC: 7.6 10*3/uL (ref 4.0–10.5)
nRBC: 0 % (ref 0.0–0.2)

## 2019-01-14 LAB — URINE CULTURE: Culture: NO GROWTH

## 2019-01-14 LAB — EXPECTORATED SPUTUM ASSESSMENT W GRAM STAIN, RFLX TO RESP C

## 2019-01-14 LAB — MAGNESIUM: Magnesium: 2.3 mg/dL (ref 1.7–2.4)

## 2019-01-14 LAB — BASIC METABOLIC PANEL
Anion gap: 6 (ref 5–15)
BUN: 10 mg/dL (ref 6–20)
CO2: 24 mmol/L (ref 22–32)
Calcium: 8.9 mg/dL (ref 8.9–10.3)
Chloride: 107 mmol/L (ref 98–111)
Creatinine, Ser: 0.64 mg/dL (ref 0.44–1.00)
GFR calc Af Amer: >60 mL/min (ref >60)
GFR calc non Af Amer: >60 mL/min (ref >60)
Glucose, Bld: 121 mg/dL — ABNORMAL HIGH (ref 70–99)
Potassium: 4.3 mmol/L (ref 3.5–5.1)
Sodium: 137 mmol/L (ref 135–145)

## 2019-01-14 LAB — C-REACTIVE PROTEIN: CRP: 5.5 mg/dL — ABNORMAL HIGH (ref <1.0)

## 2019-01-14 NOTE — Plan of Care (Signed)
Pt's HR is now SR 97-100.

## 2019-01-14 NOTE — Progress Notes (Signed)
PROGRESS NOTE  Anna Lucero ZES:[id number] DOB: 22-Aug-1975 DOA: 01/12/2019 PCP: Precious Gilding, PA  HPI/Recap of past 24 hours: HPI from Dr Ara Kussmaul Anna Lucero is a 43 y.o. female with a known history of sarcoidosis, bronchitis, GERD, rheumatoid arthritis, depression, anxiety and polycystic ovary syndrome presents to the emergency department for evaluation of shortness of breath.  Patient was in a usual state of health until May 31 when she was hospitalized for Sirs with an unclear source.  She was discharged home with 3 days of amoxicillin which she was compliant. Pt has had a 85-month history of cough associated with shortness of breath but states that her symptoms have become progressively worse since her discharge.  She now complains of fevers, chills, cough, shortness of breath.There has been no recent travel or sick contacts, or weight loss, night sweats. 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 She has been tested for covid twice, with negative results. Of note, pt CTA chest on 2 occassions have shown suspicion of sarcoidosis Vs lymphoma. Pt had a supraclavicular LN biopsy on 12/31/18 which was somewhat inconclusive but ??r/o lymphoma. Pt is scheduled to follow up with pulmonary as an outpt. In the ED, pt was noted to be febrile, tachycardic, and saturating well on RA. Covid negative. Pt was started on cefepime and Vanco for healthcare associated pneumonia. Pt admitted for further management.   Today, pt reports feeling slightly better, although still coughing, with some SOB  Assessment/Plan: Active Problems:   Sepsis ([place])  Sepsis likely 2/2 ??HCAP with underlying ?sarcoidosis Febrile within 24hrs, no leukocytosis LA WNL, procalcitonin negative BC X 2 NGTD COVID X 2 negative (LDH 213, ferritin 40, CRP 2.3, d-dimer 0.85, fibrinogen 562 Resp viral panel negative UA negative, UC no growth CTA chest neg for PE, with persistent  mediastinal adenopathy and scattered bilateral pulmonary opacities most consistent with sarcoidosis Continue cefepime and Vanco Continue O2, nebs as needed, singular, tessalon Pulmonary consulted, appreciate recs Monitor closely  Hypomagnesemia Replace PRN  ?Recent diagnosis of sarcoidosis, r/o lymphoma CTA chest showed possible sarcoidosis versus lymphoma Supraclavicular lymph node biopsy done on 12/31/2018 somewhat ruled out lymphoma, but inconclusive Angiotensin-converting enzyme was WNL Pulmonary consulted: Rec steroids, follow up as scheduled  HTN Continue Norvasc  History of rheumatoid arthritis Held methotrexate due to sepsis  History of depression, anxiety Continue Abilify, hold doxepin  History of GERD Continue Protonix  History of ?bronchitis Continue Allegra, Singulair, hold flonase  Obesity Lifestyle modification advised         Malnutrition Type:      Malnutrition Characteristics:      Nutrition Interventions:       Estimated body mass index is 39.32 kg/m as calculated from the following:   Height as of this encounter: 5\' 2"  (1.575 m).   Weight as of this encounter: 97.5 kg.     Code Status: Full  Family Communication: None at bedside  Disposition Plan: Home once work-up and management is complete   Consultants:  Pulmonology  Procedures:  None  Antimicrobials:  Cefepime  Vancomycin  DVT prophylaxis: Lovenox   Objective: Vitals:   01/14/19 0515 01/14/19 0809 01/14/19 1343 01/14/19 1354  BP: 101/83  118/75   Pulse: 84  (!) 101   Resp: 20  15   Temp: (!) 97.5 F (36.4 C)  98.3 F (36.8 C)   TempSrc: Oral  Oral   SpO2: 98% 97% 100% 99%  Weight:  Height:        Intake/Output Summary (Last 24 hours) at 01/14/2019 1443 Last data filed at 01/14/2019 1000 Gross per 24 hour  Intake 1009.06 ml  Output 425 ml  Net 584.06 ml   Filed Weights   01/12/19 1900  Weight: 97.5 kg    Exam:  General: NAD    Cardiovascular: S1, S2 present  Respiratory:  Diminished breath sounds bilaterally  Abdomen: Soft, nontender, nondistended, bowel sounds present  Musculoskeletal: No bilateral pedal edema noted  Skin: Normal  Psychiatry: Normal mood   Data Reviewed: CBC: Recent Labs  Lab 01/12/19 1853 01/13/19 0504 01/14/19 0434  WBC 10.4 8.9 7.6  NEUTROABS 7.5  --  5.3  HGB 14.3 12.5 12.2  HCT 45.9 41.9 40.5  MCV 89.3 91.1 90.0  PLT 369 309 329   Basic Metabolic Panel: Recent Labs  Lab 01/12/19 1853 01/13/19 0504 01/14/19 0434  NA 138 138 137  K 3.7 3.6 4.3  CL 105 111 107  CO2 23 22 24   GLUCOSE 101* 114* 121*  BUN 11 8 10   CREATININE 0.72 0.70 0.64  CALCIUM 9.1 8.4* 8.9  MG  --  1.6* 2.3  PHOS  --  3.0  --    GFR: Estimated Creatinine Clearance: 98.9 mL/min (by C-G formula based on SCr of 0.64 mg/dL). Liver Function Tests: Recent Labs  Lab 01/12/19 1853 01/13/19 0504  AST 26 18  ALT 25 20  ALKPHOS 99 75  BILITOT 0.2* 0.3  PROT 8.9* 7.1  ALBUMIN 3.8 3.0*   No results for input(s): LIPASE, AMYLASE in the last 168 hours. No results for input(s): AMMONIA in the last 168 hours. Coagulation Profile: No results for input(s): INR, PROTIME in the last 168 hours. Cardiac Enzymes: Recent Labs  Lab 01/12/19 1853  TROPONINI <0.03   BNP (last 3 results) No results for input(s): PROBNP in the last 8760 hours. HbA1C: No results for input(s): HGBA1C in the last 72 hours. CBG: No results for input(s): GLUCAP in the last 168 hours. Lipid Profile: Recent Labs    01/12/19 1853  TRIG 119   Thyroid Function Tests: No results for input(s): TSH, T4TOTAL, FREET4, T3FREE, THYROIDAB in the last 72 hours. Anemia Panel: Recent Labs    01/12/19 1853  FERRITIN 40   Urine analysis:    Component Value Date/Time   COLORURINE YELLOW 01/12/2019 1852   APPEARANCEUR HAZY (A) 01/12/2019 1852   LABSPEC 1.019 01/12/2019 1852   PHURINE 5.0 01/12/2019 1852   GLUCOSEU NEGATIVE  01/12/2019 1852   HGBUR NEGATIVE 01/12/2019 1852   BILIRUBINUR NEGATIVE 01/12/2019 1852   KETONESUR NEGATIVE 01/12/2019 1852   PROTEINUR NEGATIVE 01/12/2019 1852   NITRITE NEGATIVE 01/12/2019 1852   LEUKOCYTESUR NEGATIVE 01/12/2019 1852   Sepsis Labs: @LABRCNTIP (procalcitonin:4,lacticidven:4)  ) Recent Results (from the past 240 hour(s))  Blood culture (routine x 2)     Status: None (Preliminary result)   Collection Time: 01/12/19  6:52 PM   Specimen: BLOOD  Result Value Ref Range Status   Specimen Description   Final    BLOOD RIGHT ANTECUBITAL Performed at [facility], [place] [address] [place], [place] [address]    Special Requests   Final    BOTTLES DRAWN AEROBIC AND ANAEROBIC Blood Culture adequate volume Performed at [facility] [address] [place], [place] [address]    Culture   Final    NO GROWTH < 24 HOURS Performed at [place],  [place] [address]    Report Status PENDING  Incomplete  Urine culture     Status: None   Collection Time: 01/12/19  6:53 PM   Specimen: Urine, Random  Result Value Ref Range Status   Specimen Description   Final    URINE, RANDOM Performed at [place] [address], [place], [place] [address]    Special Requests   Final    NONE Performed at [facility], [place] [address] [place], [place] [address]    Culture   Final    NO GROWTH Performed at [facility], [place] [address], [place], [place] [address]    Report Status 01/14/2019 FINAL  Final  SARS Coronavirus 2 [facility] order, Performed in [place] hospital lab)     Status: None   Collection Time: 01/12/19  6:53 PM   Specimen: Nasopharyngeal Swab  Result Value Ref Range Status   SARS Coronavirus 2 NEGATIVE NEGATIVE Final    Comment: (NOTE) If result is NEGATIVE SARS-CoV-2 target nucleic acids are NOT DETECTED. The SARS-CoV-2 RNA is generally detectable in upper and  lower  respiratory specimens during the acute phase of infection. The lowest  concentration of SARS-CoV-2 viral copies this assay can detect is 250  copies / mL. A negative result does not preclude SARS-CoV-2 infection  and should not be used as the sole basis for treatment or other  patient management decisions.  A negative result may occur with  improper specimen collection / handling, submission of specimen other  than nasopharyngeal swab, presence of viral mutation(s) within the  areas targeted by this assay, and inadequate number of viral copies  (<250 copies / mL). A negative result must be combined with clinical  observations, patient history, and epidemiological information. If result is POSITIVE SARS-CoV-2 target nucleic acids are DETECTED. The SARS-CoV-2 RNA is generally detectable in upper and lower  respiratory specimens dur ing the acute phase of infection.  Positive  results are indicative of active infection with SARS-CoV-2.  Clinical  correlation with patient history and other diagnostic information is  necessary to determine patient infection status.  Positive results do  not rule out bacterial infection or co-infection with other viruses. If result is PRESUMPTIVE POSTIVE SARS-CoV-2 nucleic acids MAY BE PRESENT.   A presumptive positive result was obtained on the submitted specimen  and confirmed on repeat testing.  While 2019 novel coronavirus  (SARS-CoV-2) nucleic acids may be present in the submitted sample  additional confirmatory testing may be necessary for epidemiological  and / or clinical management purposes  to differentiate between  SARS-CoV-2 and other Sarbecovirus currently known to infect humans.  If clinically indicated additional testing with an alternate test  methodology [phone]) is advised. The SARS-CoV-2 RNA is generally  detectable in upper and lower respiratory sp ecimens during the acute  phase of infection. The expected result is Negative.  Fact Sheet for Patients:  StrictlyIdeas.no Fact Sheet for Healthcare Providers: BankingDealers.co.za This test is not yet approved or cleared by the [place] FDA and has been authorized for detection and/or diagnosis of SARS-CoV-2 by FDA under an Emergency Use Authorization (EUA).  This EUA will remain in effect (meaning this test can be used) for the duration of the COVID-19 declaration under Section 564(b)(1) of the Act, 21 U.S.C. section 360bbb-3(b)(1), unless the authorization is terminated or revoked sooner. Performed at [facility], [place] [address] [place], [place] [address]   Blood culture (routine x 2)  Status: None (Preliminary result)   Collection Time: 01/12/19  6:57 PM   Specimen: BLOOD  Result Value Ref Range Status   Specimen Description   Final    BLOOD LEFT ANTECUBITAL Performed at [facility], [place] [address], [place], [place] [address]    Special Requests   Final    BOTTLES DRAWN AEROBIC AND ANAEROBIC Blood Culture adequate volume Performed at [place] [address] [place], [place] [address]    Culture   Final    NO GROWTH < 24 HOURS Performed at [facility] [address], [place], [place] [address]    Report Status PENDING  Incomplete  Culture, sputum-assessment     Status: None   Collection Time: 01/13/19  3:59 AM   Specimen: Sputum  Result Value Ref Range Status   Specimen Description SPUTUM  Final   Special Requests Immunocompromised  Final   Sputum evaluation   Final    THIS SPECIMEN IS ACCEPTABLE FOR SPUTUM CULTURE Performed at [facility], [place] [address], [place], [place] [address]    Report Status 01/14/2019 FINAL  Final  Respiratory Panel by PCR     Status: None   Collection Time: 01/13/19 12:24 PM   Specimen: Nasopharyngeal Swab; Respiratory  Result Value Ref Range Status   Adenovirus NOT DETECTED NOT DETECTED  Final   Coronavirus 229E NOT DETECTED NOT DETECTED Final    Comment: (NOTE) The Coronavirus on the Respiratory Panel, DOES NOT test for the novel  Coronavirus (2019 nCoV)    Coronavirus HKU1 NOT DETECTED NOT DETECTED Final   Coronavirus NL63 NOT DETECTED NOT DETECTED Final   Coronavirus OC43 NOT DETECTED NOT DETECTED Final   Metapneumovirus NOT DETECTED NOT DETECTED Final   Rhinovirus / Enterovirus NOT DETECTED NOT DETECTED Final   Influenza A NOT DETECTED NOT DETECTED Final   Influenza B NOT DETECTED NOT DETECTED Final   Parainfluenza Virus 1 NOT DETECTED NOT DETECTED Final   Parainfluenza Virus 2 NOT DETECTED NOT DETECTED Final   Parainfluenza Virus 3 NOT DETECTED NOT DETECTED Final   Parainfluenza Virus 4 NOT DETECTED NOT DETECTED Final   Respiratory Syncytial Virus NOT DETECTED NOT DETECTED Final   Bordetella pertussis NOT DETECTED NOT DETECTED Final   Chlamydophila pneumoniae NOT DETECTED NOT DETECTED Final   Mycoplasma pneumoniae NOT DETECTED NOT DETECTED Final    Comment: Performed at [facility], [place]. [address], [place], [place] [address]      Studies: No results found.  Scheduled Meds: . amLODipine  5 mg Oral Daily  . ARIPiprazole  5 mg Oral Daily  . enoxaparin (LOVENOX) injection  40 mg Subcutaneous Q24H  . folic acid  1 mg Oral Daily  . levalbuterol  0.63 mg Nebulization TID  . loratadine  10 mg Oral Daily  . montelukast  10 mg Oral Daily  . pantoprazole  40 mg Oral Daily  . predniSONE  40 mg Oral Q breakfast    Continuous Infusions: . ceFEPime (MAXIPIME) IV 2 g (01/14/19 0928)  . vancomycin 1,500 mg (01/13/19 1800)     LOS: 1 day     Alma Friendly, MD [facility]  If 7PM-7AM, please contact night-coverage [email or web address] 01/14/2019, 2:43 PM

## 2019-01-14 NOTE — Progress Notes (Signed)
Name: Anna Lucero MRN: [id number] DOB: Mar 31, 1976    ADMISSION DATE:  01/12/2019 CONSULTATION DATE:  01/14/2019  REFERRING MD :  Alta Corning  CHIEF COMPLAINT: Cough, fever, shortness of breath  HISTORY OF PRESENT ILLNESS: 43 year old never smoker who was initially hospitalized 5/31 with 2 to 3 months of cough and fevers, COVID testing was negative.  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  She denies any other risk factors for COVID or sick contacts.  CT angiogram showed mediastinal lymphadenopathy and bilateral pulmonary opacities and a question of sarcoidosis was raised.  IR was consulted and she underwent biopsy of right supraclavicular lymph node.  She was treated and discharged with antibiotics and was to follow-up outpatient for biopsy results. She developed fevers and worsening cough and shortness of breath and was readmitted 6/13.  Again covid testing was negative.  Again CT angiogram was performed which showed persistent mediastinal lymphadenopathy.  CT abdomen was negative for any other lymphadenopathy  She reports a history of rheumatoid arthritis since age 85 for which she has undergone numerous treatments including prednisone, methotrexate.  She has just been started on Remicade for 6 months with some relief and she follows up with [facility]ology at [facility].  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  Arthritis is mostly in her feet and hands.  She reports papular lesions on her skin along her tattoo marks Prednisone caused weight gain, hypertension  SIGNIFICANT EVENTS  6/1 ultrasound-guided biopsy of right supraclavicular lymph node  STUDIES:  CT angiogram 6/13 5/31 CT angiogram negative for PE, mediastinal lymphadenopathy 6/1 CT abdomen negative for lymph nodes  SUBJECTIVE:  defervesced No chest pain Cough with yellow sputum  VITAL SIGNS: Temp:  [97.5 F (36.4 C)-101.8 F (38.8 C)] 97.5 F (36.4 C) (06/15 0515) Pulse  Rate:  [84-130] 84 (06/15 0515) Resp:  [18-20] 20 (06/15 0515) BP: (101-148)/(83-98) 101/83 (06/15 0515) SpO2:  [94 %-100 %] 97 % (06/15 0809)  PHYSICAL EXAMINATION: General: No distress, able to lie supine, obese woman Neuro: Alert and interactive, normal affect HEENT: No pallor, icterus Cardiovascular: S1-S2 normal Lungs: Decreased breath sounds bilateral , no rhonchi  Abdomen: Soft, nontender Musculoskeletal: No deformity, no joint tenderness Skin: Papular lesions over tattoos  Recent Labs  Lab 01/12/19 1853 01/13/19 0504 01/14/19 0434  NA 138 138 137  K 3.7 3.6 4.3  CL 105 111 107  CO2 23 22 24   BUN 11 8 10   CREATININE 0.72 0.70 0.64  GLUCOSE 101* 114* 121*   Recent Labs  Lab 01/12/19 1853 01/13/19 0504 01/14/19 0434  HGB 14.3 12.5 12.2  HCT 45.9 41.9 40.5  WBC 10.4 8.9 7.6  PLT 369 309 337   Ct Angio Chest Pe W/cm &/or Wo Cm  Result Date: 01/12/2019 CLINICAL DATA:  Shortness of breath EXAM: CT ANGIOGRAPHY CHEST WITH CONTRAST TECHNIQUE: Multidetector CT imaging of the chest was performed using the standard protocol during bolus administration of intravenous contrast. Multiplanar CT image reconstructions and MIPs were obtained to evaluate the vascular anatomy. CONTRAST:  18mL OMNIPAQUE IOHEXOL 350 MG/ML SOLN COMPARISON:  12/30/2018 FINDINGS: Cardiovascular: Evaluation is significantly limited by motion artifact. Given this limitation no definite PE was identified. The heart size is stable from prior study. There is no pericardial effusion. There is significant narrowing of the right upper lobe pulmonary artery which is stable from prior study. Mediastinum/Nodes: Again identified are enlarged mediastinal and hilar lymph nodes. There are  enlarged right supraclavicular lymph nodes. There is a small left-sided thyroid nodule. No significant axillary adenopathy. Lungs/Pleura: The lung volumes are low. Again noted are scattered pulmonary opacities bilaterally. There is  significant narrowing of the bilateral lower lobe bronchi secondary to mass effect from the nearby adenopathy. There is no pneumothorax. Upper Abdomen: No acute abnormality. Musculoskeletal: There is an old healed right-sided rib fracture. Review of the MIP images confirms the above findings. IMPRESSION: 1. Examination limited by motion artifact. 2. No definite PE given the limitations described above. 3. Persistent mediastinal adenopathy and scattered bilateral pulmonary opacities is again most consistent with sarcoidosis. The patient's adenopathy causes significant narrowing of the right upper lobe pulmonary artery as well as mild to moderate narrowing of the bilateral lower lobe bronchi. Electronically Signed   By: Constance Holster M.D.   On: 01/12/2019 23:57   Dg Chest Portable 1 View  Result Date: 01/12/2019 CLINICAL DATA:  Cough and shortness of breath EXAM: PORTABLE CHEST 1 VIEW COMPARISON:  Chest x-ray dated 12/30/2018 FINDINGS: Heart size is normal. Again identified is bilateral hilar adenopathy. There is no pneumothorax. No large pleural effusion. No focal area of consolidation. IMPRESSION: Stable appearance of the chest with no active disease. Electronically Signed   By: Constance Holster M.D.   On: 01/12/2019 20:18    ASSESSMENT / PLAN:  Combination of mediastinal lymphadenopathy, pulmonary opacities and uveitis make sarcoidosis most likely.  ACE level is slightly elevated. Biopsy of right supraclavicular lymph nodes shows granulomas  I do not think we have to be concerned about lymphoma here-pulmonary parenchymal involvement would be quite uncommon with lymphoma. RVP neg  She is already on methotrexate and Remicade for rheumatoid arthritis  Recommend - Ct 40 mg pred x 3 days , then 30 mg x 3 days , then  20 mg daily - maintain her on this dose if tolerated for 4 to 6 weeks before tapering gradually to off or to lowest tolerated dose. If for some reason she does not tolerate, may  need steroid sparing agents Discussed plan  She has FU appt set up already  PCCM available as needed  Kara Mead MD. FCCP. [facility] Pager [phone] If no response call [phone]    01/14/2019, 11:00 AM

## 2019-01-15 LAB — CBC WITH DIFFERENTIAL/PLATELET
Abs Immature Granulocytes: 0.03 10*3/uL (ref 0.00–0.07)
Basophils Absolute: 0 10*3/uL (ref 0.0–0.1)
Basophils Relative: 0 %
Eosinophils Absolute: 0.2 10*3/uL (ref 0.0–0.5)
Eosinophils Relative: 2 %
HCT: 39.2 % (ref 36.0–46.0)
Hemoglobin: 11.9 g/dL — ABNORMAL LOW (ref 12.0–15.0)
Immature Granulocytes: 0 %
Lymphocytes Relative: 17 %
Lymphs Abs: 1.5 10*3/uL (ref 0.7–4.0)
MCH: 27.4 pg (ref 26.0–34.0)
MCHC: 30.4 g/dL (ref 30.0–36.0)
MCV: 90.3 fL (ref 80.0–100.0)
Monocytes Absolute: 0.9 10*3/uL (ref 0.1–1.0)
Monocytes Relative: 10 %
Neutro Abs: 6.4 10*3/uL (ref 1.7–7.7)
Neutrophils Relative %: 71 %
Platelets: 315 10*3/uL (ref 150–400)
RBC: 4.34 MIL/uL (ref 3.87–5.11)
RDW: 13.2 % (ref 11.5–15.5)
WBC: 9 10*3/uL (ref 4.0–10.5)
nRBC: 0 % (ref 0.0–0.2)

## 2019-01-15 LAB — BASIC METABOLIC PANEL
Anion gap: 8 (ref 5–15)
BUN: 13 mg/dL (ref 6–20)
CO2: 24 mmol/L (ref 22–32)
Calcium: 9 mg/dL (ref 8.9–10.3)
Chloride: 107 mmol/L (ref 98–111)
Creatinine, Ser: 0.71 mg/dL (ref 0.44–1.00)
GFR calc Af Amer: >60 mL/min (ref >60)
GFR calc non Af Amer: >60 mL/min (ref >60)
Glucose, Bld: 101 mg/dL — ABNORMAL HIGH (ref 70–99)
Potassium: 3.9 mmol/L (ref 3.5–5.1)
Sodium: 139 mmol/L (ref 135–145)

## 2019-01-15 LAB — MRSA PCR SCREENING: MRSA by PCR: NEGATIVE

## 2019-01-15 MED ORDER — HYDROCODONE-HOMATROPINE 5-1.5 MG/5ML PO SYRP
5.0000 mL | ORAL_SOLUTION | Freq: Four times a day (QID) | ORAL | Status: DC | PRN
  Administered 2019-01-15 – 2019-01-18 (×9): 5 mL via ORAL
  Filled 2019-01-15 (×9): qty 5

## 2019-01-15 NOTE — Progress Notes (Signed)
PROGRESS NOTE  Anna Lucero JWJ:[id number] DOB: [birth year] DOA: 01/12/2019 PCP: Precious Gilding, PA  HPI/Recap of past 24 hours: HPI from Dr Ara Kussmaul Iyanni Hepp is a 43 y.o. female with a known history of sarcoidosis, bronchitis, GERD, rheumatoid arthritis, depression, anxiety and polycystic ovary syndrome presents to the emergency department for evaluation of shortness of breath.  Patient was in a usual state of health until May 31 when she was hospitalized for Sirs with an unclear source.  She was discharged home with 3 days of amoxicillin which she was compliant. Pt has had a 73-month history of cough associated with shortness of breath but states that her symptoms have become progressively worse since her discharge.  She now complains of fevers, chills, cough, shortness of breath.There has been no recent travel or sick contacts, or weight loss, night sweats. 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 She has been tested for covid twice, with negative results. Of note, pt CTA chest on 2 occassions have shown suspicion of sarcoidosis Vs lymphoma. Pt had a supraclavicular LN biopsy on 12/31/18 which was somewhat inconclusive but ??r/o lymphoma. Pt is scheduled to follow up with pulmonary as an outpt. In the ED, pt was noted to be febrile, tachycardic, and saturating well on RA. Covid negative. Pt was started on cefepime and Vanco for healthcare associated pneumonia. Pt admitted for further management.   Today, pt still with significant cough, denies any chest pain, worsening shortness of breath, fever/chills.  Assessment/Plan: Active Problems:   Sepsis ([place])  Sepsis likely 2/2 ??HCAP with underlying ?sarcoidosis Currently afebrile, no leukocytosis LA WNL, procalcitonin negative BC X 2 NGTD COVID X 2 negative (LDH 213, ferritin 40, CRP 2.3, d-dimer 0.85, fibrinogen 562 Resp viral panel negative UA negative, UC no growth CTA chest neg  for PE, with persistent mediastinal adenopathy and scattered bilateral pulmonary opacities most consistent with sarcoidosis Continue cefepime, d/c Vancomycin  Continue O2, nebs as needed, singular, cough suppressant  Pulmonary consulted, appreciate recs Monitor closely  Hypomagnesemia Replace PRN  ?Recent diagnosis of sarcoidosis, r/o lymphoma CTA chest showed possible sarcoidosis versus lymphoma Supraclavicular lymph node biopsy done on 12/31/2018 somewhat ruled out lymphoma, but inconclusive Angiotensin-converting enzyme was WNL Pulmonary consulted: Rec steroids, follow up as scheduled  HTN Continue Norvasc  History of rheumatoid arthritis Held methotrexate due to sepsis  History of depression, anxiety Continue Abilify, hold doxepin  History of GERD Continue Protonix  History of ?bronchitis Continue Allegra, Singulair, hold flonase  Obesity Lifestyle modification advised         Malnutrition Type:      Malnutrition Characteristics:      Nutrition Interventions:       Estimated body mass index is 39.32 kg/m as calculated from the following:   Height as of this encounter: 5\' 2"  (1.575 m).   Weight as of this encounter: 97.5 kg.     Code Status: Full  Family Communication: None at bedside  Disposition Plan: Home once work-up and management is complete   Consultants:  Pulmonology  Procedures:  None  Antimicrobials:  Cefepime  DVT prophylaxis: Lovenox   Objective: Vitals:   01/15/19 1354 01/15/19 1358 01/15/19 1426 01/15/19 1508  BP:   117/77 109/84  Pulse:  (!) 117 (!) 116 (!) 103  Resp:  (!) 24 (!) 22 20  Temp:  99.6 F (37.6 C) 99.1 F (37.3 C) 98.8 F (37.1 C)  TempSrc:  Oral Oral  Oral  SpO2: 93% 95% 97% 99%  Weight:      Height:        Intake/Output Summary (Last 24 hours) at 01/15/2019 1634 Last data filed at 01/15/2019 1500 Gross per 24 hour  Intake 1186.18 ml  Output 1800 ml  Net -613.82 ml   Filed Weights    01/12/19 1900  Weight: 97.5 kg    Exam:  General: NAD   Cardiovascular: S1, S2 present  Respiratory:  Diminished breath sounds bilaterally  Abdomen: Soft, nontender, nondistended, bowel sounds present  Musculoskeletal: No bilateral pedal edema noted  Skin: Normal  Psychiatry: Normal mood   Data Reviewed: CBC: Recent Labs  Lab 01/12/19 1853 01/13/19 0504 01/14/19 0434 01/15/19 0424  WBC 10.4 8.9 7.6 9.0  NEUTROABS 7.5  --  5.3 6.4  HGB 14.3 12.5 12.2 11.9*  HCT 45.9 41.9 40.5 39.2  MCV 89.3 91.1 90.0 90.3  PLT 369 309 337 035   Basic Metabolic Panel: Recent Labs  Lab 01/12/19 1853 01/13/19 0504 01/14/19 0434 01/15/19 0424  NA 138 138 137 139  K 3.7 3.6 4.3 3.9  CL 105 111 107 107  CO2 23 22 24 24   GLUCOSE 101* 114* 121* 101*  BUN 11 8 10 13   CREATININE 0.72 0.70 0.64 0.71  CALCIUM 9.1 8.4* 8.9 9.0  MG  --  1.6* 2.3  --   PHOS  --  3.0  --   --    GFR: Estimated Creatinine Clearance: 98.9 mL/min (by C-G formula based on SCr of 0.71 mg/dL). Liver Function Tests: Recent Labs  Lab 01/12/19 1853 01/13/19 0504  AST 26 18  ALT 25 20  ALKPHOS 99 75  BILITOT 0.2* 0.3  PROT 8.9* 7.1  ALBUMIN 3.8 3.0*   No results for input(s): LIPASE, AMYLASE in the last 168 hours. No results for input(s): AMMONIA in the last 168 hours. Coagulation Profile: No results for input(s): INR, PROTIME in the last 168 hours. Cardiac Enzymes: Recent Labs  Lab 01/12/19 1853  TROPONINI <0.03   BNP (last 3 results) No results for input(s): PROBNP in the last 8760 hours. HbA1C: No results for input(s): HGBA1C in the last 72 hours. CBG: No results for input(s): GLUCAP in the last 168 hours. Lipid Profile: Recent Labs    01/12/19 1853  TRIG 119   Thyroid Function Tests: No results for input(s): TSH, T4TOTAL, FREET4, T3FREE, THYROIDAB in the last 72 hours. Anemia Panel: Recent Labs    01/12/19 1853  FERRITIN 40   Urine analysis:    Component Value Date/Time    COLORURINE YELLOW 01/12/2019 1852   APPEARANCEUR HAZY (A) 01/12/2019 1852   LABSPEC 1.019 01/12/2019 1852   PHURINE 5.0 01/12/2019 1852   GLUCOSEU NEGATIVE 01/12/2019 1852   HGBUR NEGATIVE 01/12/2019 1852   BILIRUBINUR NEGATIVE 01/12/2019 1852   KETONESUR NEGATIVE 01/12/2019 1852   PROTEINUR NEGATIVE 01/12/2019 1852   NITRITE NEGATIVE 01/12/2019 1852   LEUKOCYTESUR NEGATIVE 01/12/2019 1852   Sepsis Labs: @LABRCNTIP (procalcitonin:4,lacticidven:4)  ) Recent Results (from the past 240 hour(s))  Blood culture (routine x 2)     Status: None (Preliminary result)   Collection Time: 01/12/19  6:52 PM   Specimen: BLOOD  Result Value Ref Range Status   Specimen Description   Final    BLOOD RIGHT ANTECUBITAL Performed at [facility], [place] [address], [place], [place] [address]    Special Requests   Final    BOTTLES DRAWN AEROBIC AND ANAEROBIC Blood Culture adequate volume Performed  at [facility], [place] [address] [place], [place] [address]    Culture   Final    NO GROWTH 2 DAYS Performed at [place] [address] [place], [place] [address]    Report Status PENDING  Incomplete  Urine culture     Status: None   Collection Time: 01/12/19  6:53 PM   Specimen: Urine, Random  Result Value Ref Range Status   Specimen Description   Final    URINE, RANDOM Performed at [place] [address] [place], [place] [address]    Special Requests   Final    NONE Performed at [facility], [place] [address], [place], [place] [address]    Culture   Final    NO GROWTH Performed at [facility], [place] [address] [place], [place] [address]    Report Status 01/14/2019 FINAL  Final  SARS Coronavirus 2 [facility] order, Performed in [place] hospital lab)     Status: None   Collection Time: 01/12/19  6:53 PM   Specimen: Nasopharyngeal Swab  Result Value Ref Range Status   SARS Coronavirus 2 NEGATIVE NEGATIVE  Final    Comment: (NOTE) If result is NEGATIVE SARS-CoV-2 target nucleic acids are NOT DETECTED. The SARS-CoV-2 RNA is generally detectable in upper and lower  respiratory specimens during the acute phase of infection. The lowest  concentration of SARS-CoV-2 viral copies this assay can detect is 250  copies / mL. A negative result does not preclude SARS-CoV-2 infection  and should not be used as the sole basis for treatment or other  patient management decisions.  A negative result may occur with  improper specimen collection / handling, submission of specimen other  than nasopharyngeal swab, presence of viral mutation(s) within the  areas targeted by this assay, and inadequate number of viral copies  (<250 copies / mL). A negative result must be combined with clinical  observations, patient history, and epidemiological information. If result is POSITIVE SARS-CoV-2 target nucleic acids are DETECTED. The SARS-CoV-2 RNA is generally detectable in upper and lower  respiratory specimens dur ing the acute phase of infection.  Positive  results are indicative of active infection with SARS-CoV-2.  Clinical  correlation with patient history and other diagnostic information is  necessary to determine patient infection status.  Positive results do  not rule out bacterial infection or co-infection with other viruses. If result is PRESUMPTIVE POSTIVE SARS-CoV-2 nucleic acids MAY BE PRESENT.   A presumptive positive result was obtained on the submitted specimen  and confirmed on repeat testing.  While 2019 novel coronavirus  (SARS-CoV-2) nucleic acids may be present in the submitted sample  additional confirmatory testing may be necessary for epidemiological  and / or clinical management purposes  to differentiate between  SARS-CoV-2 and other Sarbecovirus currently known to infect humans.  If clinically indicated additional testing with an alternate test  methodology [phone]) is advised. The  SARS-CoV-2 RNA is generally  detectable in upper and lower respiratory sp ecimens during the acute  phase of infection. The expected result is Negative. Fact Sheet for Patients:  StrictlyIdeas.no Fact Sheet for Healthcare Providers: BankingDealers.co.za This test is not yet approved or cleared by the [place] FDA and has been authorized for detection and/or diagnosis of SARS-CoV-2 by FDA under an Emergency Use Authorization (EUA).  This EUA will remain in effect (meaning this test can be used) for the duration of the COVID-19 declaration under Section  564(b)(1) of the Act, 21 U.S.C. section 360bbb-3(b)(1), unless the authorization is terminated or revoked sooner. Performed at [facility], [place] [address] [place], [place] [address]   Blood culture (routine x 2)     Status: None (Preliminary result)   Collection Time: 01/12/19  6:57 PM   Specimen: BLOOD  Result Value Ref Range Status   Specimen Description   Final    BLOOD LEFT ANTECUBITAL Performed at [facility], [place] [address], [place], [place] [address]    Special Requests   Final    BOTTLES DRAWN AEROBIC AND ANAEROBIC Blood Culture adequate volume Performed at [place] [address], [place], [place] [address]    Culture   Final    NO GROWTH 2 DAYS Performed at [place] [address], [place], [place] [address]    Report Status PENDING  Incomplete  Culture, sputum-assessment     Status: None   Collection Time: 01/13/19  3:59 AM   Specimen: Sputum  Result Value Ref Range Status   Specimen Description SPUTUM  Final   Special Requests Immunocompromised  Final   Sputum evaluation   Final    THIS SPECIMEN IS ACCEPTABLE FOR SPUTUM CULTURE Performed at [facility], [place] [address] [place], [place] [address]    Report Status 01/14/2019 FINAL  Final  Culture, respiratory     Status: None  (Preliminary result)   Collection Time: 01/13/19  3:59 AM   Specimen: SPU  Result Value Ref Range Status   Specimen Description   Final    SPUTUM Performed at [place] [address], [place], [place] [address]    Special Requests   Final    Immunocompromised Reflexed from [phone] Performed at [facility], [place] [address], [place], [place] [address]    Gram Stain   Final    ABUNDANT WBC PRESENT,BOTH PMN AND MONONUCLEAR RARE GRAM POSITIVE COCCI    Culture   Final    CULTURE REINCUBATED FOR BETTER GROWTH Performed at [facility], [place] [address], [place], [place] [address]    Report Status PENDING  Incomplete  Respiratory Panel by PCR     Status: None   Collection Time: 01/13/19 12:24 PM   Specimen: Nasopharyngeal Swab; Respiratory  Result Value Ref Range Status   Adenovirus NOT DETECTED NOT DETECTED Final   Coronavirus 229E NOT DETECTED NOT DETECTED Final    Comment: (NOTE) The Coronavirus on the Respiratory Panel, DOES NOT test for the novel  Coronavirus (2019 nCoV)    Coronavirus HKU1 NOT DETECTED NOT DETECTED Final   Coronavirus NL63 NOT DETECTED NOT DETECTED Final   Coronavirus OC43 NOT DETECTED NOT DETECTED Final   Metapneumovirus NOT DETECTED NOT DETECTED Final   Rhinovirus / Enterovirus NOT DETECTED NOT DETECTED Final   Influenza A NOT DETECTED NOT DETECTED Final   Influenza B NOT DETECTED NOT DETECTED Final   Parainfluenza Virus 1 NOT DETECTED NOT DETECTED Final   Parainfluenza Virus 2 NOT DETECTED NOT DETECTED Final   Parainfluenza Virus 3 NOT DETECTED NOT DETECTED Final   Parainfluenza Virus 4 NOT DETECTED NOT DETECTED Final   Respiratory Syncytial Virus NOT DETECTED NOT DETECTED Final   Bordetella pertussis NOT DETECTED NOT DETECTED Final   Chlamydophila pneumoniae NOT DETECTED NOT DETECTED Final   Mycoplasma pneumoniae NOT DETECTED NOT DETECTED Final    Comment: Performed at [facility], [place]. [place],  [place] [address]  MRSA PCR Screening     Status: None   Collection Time: 01/15/19  8:54 AM   Specimen: Nasal Mucosa; Nasopharyngeal  Result Value Ref Range Status   MRSA by PCR NEGATIVE NEGATIVE Final    Comment:        The GeneXpert MRSA Assay (FDA approved for NASAL specimens only), is one component of a comprehensive MRSA colonization surveillance program. It is not intended to diagnose MRSA infection nor to guide or monitor treatment for MRSA infections. Performed at [facility], [place] [address], [place], [place] [address]       Studies: No results found.  Scheduled Meds: . amLODipine  5 mg Oral Daily  . ARIPiprazole  5 mg Oral Daily  . enoxaparin (LOVENOX) injection  40 mg Subcutaneous Q24H  . folic acid  1 mg Oral Daily  . levalbuterol  0.63 mg Nebulization TID  . loratadine  10 mg Oral Daily  . montelukast  10 mg Oral Daily  . pantoprazole  40 mg Oral Daily  . predniSONE  40 mg Oral Q breakfast    Continuous Infusions: . ceFEPime (MAXIPIME) IV 2 g (01/15/19 1511)     LOS: 2 days     Alma Friendly, MD [facility]  If 7PM-7AM, please contact night-coverage [email or web address] 01/15/2019, 4:34 PM

## 2019-01-15 NOTE — Plan of Care (Signed)
  Problem: Fluid Volume: Goal: Hemodynamic stability will improve Outcome: Progressing   Problem: Education: Goal: Knowledge of General Education information will improve Description: Including pain rating scale, medication(s)/side effects and non-pharmacologic comfort measures Outcome: Progressing   Problem: Health Behavior/Discharge Planning: Goal: Ability to manage health-related needs will improve Outcome: Progressing   Problem: Coping: Goal: Level of anxiety will decrease Outcome: Progressing   Problem: Pain Managment: Goal: General experience of comfort will improve Outcome: Progressing

## 2019-01-15 NOTE — Plan of Care (Signed)
  Problem: Fluid Volume: Goal: Hemodynamic stability will improve Outcome: Progressing   Problem: Education: Goal: Knowledge of General Education information will improve Description: Including pain rating scale, medication(s)/side effects and non-pharmacologic comfort measures Outcome: Progressing   Problem: Health Behavior/Discharge Planning: Goal: Ability to manage health-related needs will improve Outcome: Progressing   Problem: Clinical Measurements: Goal: Respiratory complications will improve Outcome: Progressing Goal: Cardiovascular complication will be avoided Outcome: Progressing   Problem: Activity: Goal: Risk for activity intolerance will decrease Outcome: Progressing   Problem: Nutrition: Goal: Adequate nutrition will be maintained Outcome: Progressing   Problem: Coping: Goal: Level of anxiety will decrease Outcome: Progressing   Problem: Pain Managment: Goal: General experience of comfort will improve Outcome: Progressing

## 2019-01-15 NOTE — Progress Notes (Signed)
   01/15/19 1252  MEWS Score  Resp (!) 23  Pulse Rate (!) 113  BP (!) 144/97  Temp (!) 101.5 F (38.6 C)  SpO2 98 %  O2 Device Nasal Cannula  MEWS Score  MEWS RR 1  MEWS Pulse 2  MEWS Systolic 0  MEWS LOC 0  MEWS Temp 2  MEWS Score 5  MEWS Score Color Red  MEWS Assessment  Is this an acute change? No (notified dept CN Baxter Flattery), will continue to monitor)  MEWS guidelines implemented *See Row Information* Red

## 2019-01-16 DIAGNOSIS — D869 Sarcoidosis, unspecified: Secondary | ICD-10-CM

## 2019-01-16 LAB — CBC WITH DIFFERENTIAL/PLATELET
Abs Immature Granulocytes: 0.03 10*3/uL (ref 0.00–0.07)
Basophils Absolute: 0 10*3/uL (ref 0.0–0.1)
Basophils Relative: 1 %
Eosinophils Absolute: 0.3 10*3/uL (ref 0.0–0.5)
Eosinophils Relative: 6 %
HCT: 39.1 % (ref 36.0–46.0)
Hemoglobin: 12 g/dL (ref 12.0–15.0)
Immature Granulocytes: 1 %
Lymphocytes Relative: 24 %
Lymphs Abs: 1.4 10*3/uL (ref 0.7–4.0)
MCH: 27.8 pg (ref 26.0–34.0)
MCHC: 30.7 g/dL (ref 30.0–36.0)
MCV: 90.7 fL (ref 80.0–100.0)
Monocytes Absolute: 0.8 10*3/uL (ref 0.1–1.0)
Monocytes Relative: 13 %
Neutro Abs: 3.4 10*3/uL (ref 1.7–7.7)
Neutrophils Relative %: 55 %
Platelets: 298 10*3/uL (ref 150–400)
RBC: 4.31 MIL/uL (ref 3.87–5.11)
RDW: 13.2 % (ref 11.5–15.5)
WBC: 6 10*3/uL (ref 4.0–10.5)
nRBC: 0 % (ref 0.0–0.2)

## 2019-01-16 LAB — BASIC METABOLIC PANEL
Anion gap: 10 (ref 5–15)
BUN: 10 mg/dL (ref 6–20)
CO2: 22 mmol/L (ref 22–32)
Calcium: 8.7 mg/dL — ABNORMAL LOW (ref 8.9–10.3)
Chloride: 106 mmol/L (ref 98–111)
Creatinine, Ser: 0.69 mg/dL (ref 0.44–1.00)
GFR calc Af Amer: >60 mL/min (ref >60)
GFR calc non Af Amer: >60 mL/min (ref >60)
Glucose, Bld: 93 mg/dL (ref 70–99)
Potassium: 3.4 mmol/L — ABNORMAL LOW (ref 3.5–5.1)
Sodium: 138 mmol/L (ref 135–145)

## 2019-01-16 LAB — CULTURE, RESPIRATORY W GRAM STAIN: Culture: NORMAL

## 2019-01-16 MED ORDER — METHYLPREDNISOLONE SODIUM SUCC 40 MG IJ SOLR
40.0000 mg | Freq: Two times a day (BID) | INTRAMUSCULAR | Status: DC
  Administered 2019-01-16 – 2019-01-17 (×3): 40 mg via INTRAVENOUS
  Filled 2019-01-16 (×3): qty 1

## 2019-01-16 MED ORDER — POTASSIUM CHLORIDE CRYS ER 20 MEQ PO TBCR
40.0000 meq | EXTENDED_RELEASE_TABLET | Freq: Once | ORAL | Status: AC
  Administered 2019-01-16: 40 meq via ORAL
  Filled 2019-01-16: qty 2

## 2019-01-16 MED ORDER — SODIUM CHLORIDE 0.9 % IV SOLN
INTRAVENOUS | Status: DC | PRN
  Administered 2019-01-17: 250 mL via INTRAVENOUS

## 2019-01-16 NOTE — Progress Notes (Signed)
PT Cancellation Note  Patient Details Name: Anna Lucero MRN: [id number] DOB: [birth year]   Cancelled Treatment:     Pt refused PT this afternoon due to just got back in bed and is tired. Pt reported she has been up in the chair today and has been ambulating to the bathroom without assistance. Will attempt to see patient tomorrow.   Lelon Mast 01/16/2019, 1:36 PM

## 2019-01-16 NOTE — Plan of Care (Signed)
  Problem: Fluid Volume: Goal: Hemodynamic stability will improve Outcome: Completed/Met   Problem: Respiratory: Goal: Ability to maintain adequate ventilation will improve Outcome: Progressing   Problem: Education: Goal: Knowledge of General Education information will improve Description: Including pain rating scale, medication(s)/side effects and non-pharmacologic comfort measures Outcome: Completed/Met

## 2019-01-16 NOTE — Progress Notes (Signed)
Pharmacy Antibiotic Note  Anna Lucero is a 43 y.o. female admitted on 01/12/2019 with pneumonia.  Pharmacy has been consulted for cefepime dosing.  Pt being followed by pulmonary, suspect sarcoidosis most likely. Pt remains on ABX for PNA.  Today, 01/16/19  WBC 6  SCr 0.7, CrCl > 60 mL/min. Stable.   Tmax 101.5  Plan:  Continue cefepime 2 g IV q8h  Renal function has been stable, do not anticipate dose adjustment needed. Pharmacy to sign off and follow dosing peripherally. Please re-consult if needed.  Height: 5\' 2"  (157.5 cm) Weight: 215 lb (97.5 kg) IBW/kg (Calculated) : 50.1  Temp (24hrs), Avg:99.4 F (37.4 C), Min:98.4 F (36.9 C), Max:101.5 F (38.6 C)  Recent Labs  Lab 01/12/19 1853 01/13/19 0504 01/14/19 0434 01/15/19 0424 01/16/19 0430  WBC 10.4 8.9 7.6 9.0 6.0  CREATININE 0.72 0.70 0.64 0.71 0.69  LATICACIDVEN 1.8 1.2  --   --   --     Estimated Creatinine Clearance: 98.9 mL/min (by C-G formula based on SCr of 0.69 mg/dL).    Allergies  Allergen Reactions  . 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 Celebrex [Celecoxib] Other (See Comments)    Chest pain  . Spironolactone Other (See Comments)    Bleeding    Antimicrobials this admission: 6/13 cefepime >>  6/13 vancomycin >> 6/16  Dose adjustments this admission:  Microbiology results: 6/13 BCx x2: NGTD 6/13 UCx: NG-F 6/13 COVID: negative 6/14 Resp Panel: negative 6/14 Resp Cx: Pending - rare GPC 5/31 MRSa PCR: negative 6/16 MRSA PCR: negative  Thank you for allowing pharmacy to be a part of this patient's care.  Lenis Noon, PharmD 01/16/2019 9:26 AM

## 2019-01-16 NOTE — Progress Notes (Signed)
PROGRESS NOTE    Anna Lucero  WUJ:[id number] DOB: 1976-01-31 DOA: 01/12/2019 PCP: Precious Gilding, PA   Brief Narrative:  HPI from Dr Frutoso Schatz Anna Lucero 43 y.o.femalewith Anna Lucero known history of sarcoidosis, bronchitis, GERD, rheumatoid arthritis, depression, anxiety and polycystic ovary syndromepresents to the emergency department for evaluation of shortness of breath. Patient was in Emeril Lucero usual state of health until May 31 when she was hospitalized for Sirs with an unclear source. She was discharged home with 3 days of amoxicillin which she was compliant. Pt has had Anna Lucero 10-month history of cough associated with shortness of breath but states that her symptoms have become progressively worse since her discharge. She now complains of fevers, chills, cough, shortness of breath.There has been no recenttravel or sick contacts, or weight loss, night sweats. Patient was offered [facility] job at [facility], but has not started and has no exposure to Covid. She has been tested for covid twice, with negative results. Of note, pt CTA chest on 2 occassions have shown suspicion of sarcoidosis Vs lymphoma. Pt had [name] supraclavicular LN biopsy on 12/31/18 which was somewhat inconclusive but ??r/o lymphoma. Pt is scheduled to follow up with pulmonary as an outpt. In the ED, pt was noted to be febrile, tachycardic, and saturating well on RA. Covid negative. Pt was started on cefepime and Vanco for healthcare associated pneumonia. Pt admitted for further management.  Assessment & Plan:   Active Problems:   Sepsis (Anna Lucero)  Shortness of Breath  Acute Hypoxic Respiratory Failure  Sarcoidosis  Appreciate pulm recs, will give solumedrol for Anna Lucero few doses and see how she responds, then resume previously discussed taper (pt noted minimal improvement today since steroids were started - discussed with pulm) 6/1 supraclavicular LN biopsy with granulomatous lymphadenitis - sarcoidosis favored (see report) 6/1 bx without  monoclonal B cell population or abnormal T cell phenotype  Per pulm, recommended starting prednisone (40 mg and taper to 20 mg by time of d/c with plan for maintaining at this dose for 4-6 weeks) Initially concern for lymphoma as well, but per pulm, lower suspicion for lymphoma as pulm parenchymal involvement uncommon  Negative ACE   Sepsis likely 2/2 Possible HCAP with underlying Sarcoidosis Currently afebrile, no leukocytosis Fever 6/16 to 101.5 If recurrent fever, will plan to repeat blood cx procalcitonin negative BC X 2 NGTD x 3 COVID X 2 negative (LDH 213, ferritin 40, CRP 2.3, d-dimer 0.85, fibrinogen 562) Resp viral panel negative UA negative, UC no growth Sputum cx with resp flora  CTA chest neg for PE, with persistent mediastinal adenopathy and scattered bilateral pulmonary opacities most consistent with sarcoidosis Continue cefepime for now, d/c Vancomycin  Continue O2, nebs as needed, singular, cough suppressant  Pulmonary consulted, appreciate recs Monitor closely  Hypomagnesemia Replace PRN  HTN Continue Norvasc  History of rheumatoid arthritis Held methotrexate due to sepsis Pt also on remicade  History ofdepression, anxiety ContinueAbilify, hold doxepin  History ofGERD ContinueProtonix  History of?bronchitis ContinueAllegra, Singulair, hold flonase  Obesity Lifestyle modification advised  Hypokalemia: replace, follow  DVT prophylaxis: lovenox Code Status: full  Family Communication: none at bedside Disposition Plan: pending further improvement in shortness of breath, hypoxia   Consultants:   PCCM  Procedures:  none  Antimicrobials: Anti-infectives (From admission, onward)   Start     Dose/Rate Route Frequency Ordered Stop   01/13/19 1600  vancomycin (VANCOCIN) 1,500 mg in sodium chloride 0.9 % 500 mL IVPB  Status:  Discontinued     1,500  mg 250 mL/hr over 120 Minutes Intravenous Every 24 hours 01/13/19 0119 01/15/19 1147    01/13/19 0400  ceFEPIme (MAXIPIME) 2 g in sodium chloride 0.9 % 100 mL IVPB     2 g 200 mL/hr over 30 Minutes Intravenous Every 8 hours 01/13/19 0119     01/12/19 2000  vancomycin (VANCOCIN) 1,500 mg in sodium chloride 0.9 % 500 mL IVPB     1,500 mg 250 mL/hr over 120 Minutes Intravenous  Once 01/12/19 1928 01/13/19 0045   01/12/19 1915  ceFEPIme (MAXIPIME) 2 g in sodium chloride 0.9 % 100 mL IVPB     2 g 200 mL/hr over 30 Minutes Intravenous  Once 01/12/19 1902 01/12/19 2039     Subjective: Persistent SOB and coughing fits Doesn't feel steroids have improved sx that much Feels that abx and nebs are helping more  Objective: Vitals:   01/16/19 0519 01/16/19 0747 01/16/19 1309 01/16/19 1347  BP: 122/81  113/72   Pulse: 93  (!) 106   Resp: 20  18   Temp: 98.8 F (37.1 C)  99 F (37.2 C)   TempSrc: Oral  Oral   SpO2: 100% 98% 96% 97%  Weight:      Height:        Intake/Output Summary (Last 24 hours) at 01/16/2019 1440 Last data filed at 01/16/2019 1310 Gross per 24 hour  Intake 1180 ml  Output 151 ml  Net 1029 ml   Filed Weights   01/12/19 1900  Weight: 97.5 kg    Examination:  General exam: Appears calm and comfortable  Respiratory system: coughs with exam, some expiratory wheezing Cardiovascular system: S1 & S2 heard, RRR. Gastrointestinal system: Abdomen is nondistended, soft and nontender. Central nervous system: Alert and oriented. No focal neurological deficits. Extremities: no LEE Skin: No rashes, lesions or ulcers Psychiatry: Judgement and insight appear normal. Mood & affect appropriate.     Data Reviewed: I have personally reviewed following labs and imaging studies  CBC: Recent Labs  Lab 01/12/19 1853 01/13/19 0504 01/14/19 0434 01/15/19 0424 01/16/19 0430  WBC 10.4 8.9 7.6 9.0 6.0  NEUTROABS 7.5  --  5.3 6.4 3.4  HGB 14.3 12.5 12.2 11.9* 12.0  HCT 45.9 41.9 40.5 39.2 39.1  MCV 89.3 91.1 90.0 90.3 90.7  PLT 369 309 337 315 127   Basic  Metabolic Panel: Recent Labs  Lab 01/12/19 1853 01/13/19 0504 01/14/19 0434 01/15/19 0424 01/16/19 0430  NA 138 138 137 139 138  K 3.7 3.6 4.3 3.9 3.4*  CL 105 111 107 107 106  CO2 23 22 24 24 22   GLUCOSE 101* 114* 121* 101* 93  BUN 11 8 10 13 10   CREATININE 0.72 0.70 0.64 0.71 0.69  CALCIUM 9.1 8.4* 8.9 9.0 8.7*  MG  --  1.6* 2.3  --   --   PHOS  --  3.0  --   --   --    GFR: Estimated Creatinine Clearance: 98.9 mL/min (by C-G formula based on SCr of 0.69 mg/dL). Liver Function Tests: Recent Labs  Lab 01/12/19 1853 01/13/19 0504  AST 26 18  ALT 25 20  ALKPHOS 99 75  BILITOT 0.2* 0.3  PROT 8.9* 7.1  ALBUMIN 3.8 3.0*   No results for input(s): LIPASE, AMYLASE in the last 168 hours. No results for input(s): AMMONIA in the last 168 hours. Coagulation Profile: No results for input(s): INR, PROTIME in the last 168 hours. Cardiac Enzymes: Recent Labs  Lab 01/12/19 1853  TROPONINI <0.03   BNP (last 3 results) No results for input(s): PROBNP in the last 8760 hours. HbA1C: No results for input(s): HGBA1C in the last 72 hours. CBG: No results for input(s): GLUCAP in the last 168 hours. Lipid Profile: No results for input(s): CHOL, HDL, LDLCALC, TRIG, CHOLHDL, LDLDIRECT in the last 72 hours. Thyroid Function Tests: No results for input(s): TSH, T4TOTAL, FREET4, T3FREE, THYROIDAB in the last 72 hours. Anemia Panel: No results for input(s): VITAMINB12, FOLATE, FERRITIN, TIBC, IRON, RETICCTPCT in the last 72 hours. Sepsis Labs: Recent Labs  Lab 01/12/19 1853 01/13/19 0504  PROCALCITON <0.10  --   LATICACIDVEN 1.8 1.2    Recent Results (from the past 240 hour(s))  Blood culture (routine x 2)     Status: None (Preliminary result)   Collection Time: 01/12/19  6:52 PM   Specimen: BLOOD  Result Value Ref Range Status   Specimen Description   Final    BLOOD RIGHT ANTECUBITAL Performed at [facility], [place] [address], [place], [place] [address]    Special  Requests   Final    BOTTLES DRAWN AEROBIC AND ANAEROBIC Blood Culture adequate volume Performed at [place] [address], [place], [place] [address]    Culture   Final    NO GROWTH 3 DAYS Performed at [facility], [place] [address], [place], [place] [address]    Report Status PENDING  Incomplete  Urine culture     Status: None   Collection Time: 01/12/19  6:53 PM   Specimen: Urine, Random  Result Value Ref Range Status   Specimen Description   Final    URINE, RANDOM Performed at [facility] [address] [place], [place] [address]    Special Requests   Final    NONE Performed at [facility], [place] [address] [place], [place] [address]    Culture   Final    NO GROWTH Performed at [facility], [place] [address], [place], [place] [address]    Report Status 01/14/2019 FINAL  Final  SARS Coronavirus 2 [facility] order, Performed in [place] hospital lab)     Status: None   Collection Time: 01/12/19  6:53 PM   Specimen: Nasopharyngeal Swab  Result Value Ref Range Status   SARS Coronavirus 2 NEGATIVE NEGATIVE Final    Comment: (NOTE) If result is NEGATIVE SARS-CoV-2 target nucleic acids are NOT DETECTED. The SARS-CoV-2 RNA is generally detectable in upper and lower  respiratory specimens during the acute phase of infection. The lowest  concentration of SARS-CoV-2 viral copies this assay can detect is 250  copies / mL. Eufelia Veno negative result does not preclude SARS-CoV-2 infection  and should not be used as the sole basis for treatment or other  patient management decisions.  Tyreona Panjwani negative result may occur with  improper specimen collection / handling, submission of specimen other  than nasopharyngeal swab, presence of viral mutation(s) within the  areas targeted by this assay, and inadequate number of viral copies  (<250 copies / mL). Adahlia Stembridge negative result must be combined with clinical  observations, patient  history, and epidemiological information. If result is POSITIVE SARS-CoV-2 target nucleic acids are DETECTED. The SARS-CoV-2 RNA is generally detectable in upper and lower  respiratory specimens dur ing the acute phase of infection.  Positive  results are indicative of active infection with SARS-CoV-2.  Clinical  correlation with patient history and other diagnostic information is  necessary to  determine patient infection status.  Positive results do  not rule out bacterial infection or co-infection with other viruses. If result is PRESUMPTIVE POSTIVE SARS-CoV-2 nucleic acids MAY BE PRESENT.   Carolynn Tuley presumptive positive result was obtained on the submitted specimen  and confirmed on repeat testing.  While 2019 novel coronavirus  (SARS-CoV-2) nucleic acids may be present in the submitted sample  additional confirmatory testing may be necessary for epidemiological  and / or clinical management purposes  to differentiate between  SARS-CoV-2 and other Sarbecovirus currently known to infect humans.  If clinically indicated additional testing with an alternate test  methodology [phone]) is advised. The SARS-CoV-2 RNA is generally  detectable in upper and lower respiratory sp ecimens during the acute  phase of infection. The expected result is Negative. Fact Sheet for Patients:  StrictlyIdeas.no Fact Sheet for Healthcare Providers: BankingDealers.co.za This test is not yet approved or cleared by the [place] FDA and has been authorized for detection and/or diagnosis of SARS-CoV-2 by FDA under an Emergency Use Authorization (EUA).  This EUA will remain in effect (meaning this test can be used) for the duration of the COVID-19 declaration under Section 564(b)(1) of the Act, 21 U.S.C. section 360bbb-3(b)(1), unless the authorization is terminated or revoked sooner. Performed at [facility], [place] [address] [place],  [place] [address]   Blood culture (routine x 2)     Status: None (Preliminary result)   Collection Time: 01/12/19  6:57 PM   Specimen: BLOOD  Result Value Ref Range Status   Specimen Description   Final    BLOOD LEFT ANTECUBITAL Performed at [facility], [place] [address], [place], [place] [address]    Special Requests   Final    BOTTLES DRAWN AEROBIC AND ANAEROBIC Blood Culture adequate volume Performed at [facility] [address], [place], [place] [address]    Culture   Final    NO GROWTH 3 DAYS Performed at [facility], [place] [address], [place], [place] [address]    Report Status PENDING  Incomplete  Culture, sputum-assessment     Status: None   Collection Time: 01/13/19  3:59 AM   Specimen: Sputum  Result Value Ref Range Status   Specimen Description SPUTUM  Final   Special Requests Immunocompromised  Final   Sputum evaluation   Final    THIS SPECIMEN IS ACCEPTABLE FOR SPUTUM CULTURE Performed at [facility], [place] [address] [place], [place] [address]    Report Status 01/14/2019 FINAL  Final  Culture, respiratory     Status: None   Collection Time: 01/13/19  3:59 AM   Specimen: SPU  Result Value Ref Range Status   Specimen Description   Final    SPUTUM Performed at [place] [address], [place], [place] [address]    Special Requests   Final    Immunocompromised Reflexed from [phone] Performed at [facility], [place] [address], [place], [place] [address]    Gram Stain   Final    ABUNDANT WBC PRESENT,BOTH PMN AND MONONUCLEAR RARE GRAM POSITIVE COCCI    Culture   Final    FEW Consistent with normal respiratory flora. Performed at [facility], [place] [address] [place],  [address]    Report Status 01/16/2019 FINAL  Final  Respiratory Panel by PCR     Status: None   Collection Time: 01/13/19 12:24 PM   Specimen: Nasopharyngeal Swab; Respiratory  Result Value Ref  Range Status   Adenovirus NOT DETECTED NOT DETECTED Final   Coronavirus 229E NOT DETECTED NOT DETECTED Final    Comment: (NOTE) The Coronavirus on the Respiratory Panel, DOES NOT test for the novel  Coronavirus (2019 nCoV)    Coronavirus HKU1 NOT DETECTED NOT DETECTED Final   Coronavirus NL63 NOT DETECTED NOT DETECTED Final   Coronavirus OC43 NOT DETECTED NOT DETECTED Final   Metapneumovirus NOT DETECTED NOT DETECTED Final   Rhinovirus / Enterovirus NOT DETECTED NOT DETECTED Final   Influenza [place] NOT DETECTED NOT DETECTED Final   Influenza B NOT DETECTED NOT DETECTED Final   Parainfluenza Virus 1 NOT DETECTED NOT DETECTED Final   Parainfluenza Virus 2 NOT DETECTED NOT DETECTED Final   Parainfluenza Virus 3 NOT DETECTED NOT DETECTED Final   Parainfluenza Virus 4 NOT DETECTED NOT DETECTED Final   Respiratory Syncytial Virus NOT DETECTED NOT DETECTED Final   Bordetella pertussis NOT DETECTED NOT DETECTED Final   Chlamydophila pneumoniae NOT DETECTED NOT DETECTED Final   Mycoplasma pneumoniae NOT DETECTED NOT DETECTED Final    Comment: Performed at [facility], [place] [address] [place], [place] [address]  MRSA PCR Screening     Status: None   Collection Time: 01/15/19  8:54 AM   Specimen: Nasal Mucosa; Nasopharyngeal  Result Value Ref Range Status   MRSA by PCR NEGATIVE NEGATIVE Final    Comment:        The GeneXpert MRSA Assay (FDA approved for NASAL specimens only), is one component of Surya Folden comprehensive MRSA colonization surveillance program. It is not intended to diagnose MRSA infection nor to guide or monitor treatment for MRSA infections. Performed at [facility], [place] [address], [place], [place] [address]          Radiology Studies: No results found.      Scheduled Meds: . amLODipine  5 mg Oral Daily  . ARIPiprazole  5 mg Oral Daily  . enoxaparin (LOVENOX) injection  40 mg Subcutaneous Q24H  . folic acid  1 mg Oral Daily  .  levalbuterol  0.63 mg Nebulization TID  . loratadine  10 mg Oral Daily  . methylPREDNISolone (SOLU-MEDROL) injection  40 mg Intravenous Q12H  . montelukast  10 mg Oral Daily   Continuous Infusions: . ceFEPime (MAXIPIME) IV 2 g (01/16/19 0915)     LOS: 3 days    Time spent: over 24 min    Fayrene Helper, MD [facility] Pager AMION  If 7PM-7AM, please contact night-coverage [email or web address] Password [email or web address] 01/16/2019, 2:40 PM

## 2019-01-17 LAB — CBC
HCT: 38.8 % (ref 36.0–46.0)
Hemoglobin: 12 g/dL (ref 12.0–15.0)
MCH: 27.8 pg (ref 26.0–34.0)
MCHC: 30.9 g/dL (ref 30.0–36.0)
MCV: 89.8 fL (ref 80.0–100.0)
Platelets: 337 10*3/uL (ref 150–400)
RBC: 4.32 MIL/uL (ref 3.87–5.11)
RDW: 12.9 % (ref 11.5–15.5)
WBC: 12.6 10*3/uL — ABNORMAL HIGH (ref 4.0–10.5)
nRBC: 0 % (ref 0.0–0.2)

## 2019-01-17 LAB — COMPREHENSIVE METABOLIC PANEL
ALT: 22 U/L (ref 0–44)
AST: 17 U/L (ref 15–41)
Albumin: 2.8 g/dL — ABNORMAL LOW (ref 3.5–5.0)
Alkaline Phosphatase: 69 U/L (ref 38–126)
Anion gap: 8 (ref 5–15)
BUN: 15 mg/dL (ref 6–20)
CO2: 23 mmol/L (ref 22–32)
Calcium: 9.2 mg/dL (ref 8.9–10.3)
Chloride: 105 mmol/L (ref 98–111)
Creatinine, Ser: 0.6 mg/dL (ref 0.44–1.00)
GFR calc Af Amer: >60 mL/min (ref >60)
GFR calc non Af Amer: >60 mL/min (ref >60)
Glucose, Bld: 153 mg/dL — ABNORMAL HIGH (ref 70–99)
Potassium: 4.3 mmol/L (ref 3.5–5.1)
Sodium: 136 mmol/L (ref 135–145)
Total Bilirubin: 0.3 mg/dL (ref 0.3–1.2)
Total Protein: 7.4 g/dL (ref 6.5–8.1)

## 2019-01-17 LAB — MAGNESIUM: Magnesium: 2.1 mg/dL (ref 1.7–2.4)

## 2019-01-17 MED ORDER — PREDNISONE 20 MG PO TABS
40.0000 mg | ORAL_TABLET | Freq: Every day | ORAL | Status: DC
  Administered 2019-01-18: 40 mg via ORAL
  Filled 2019-01-17: qty 2

## 2019-01-17 NOTE — Plan of Care (Signed)
  Problem: Respiratory: Goal: Ability to maintain adequate ventilation will improve Outcome: Progressing   Problem: Health Behavior/Discharge Planning: Goal: Ability to manage health-related needs will improve Outcome: Progressing   Problem: Clinical Measurements: Goal: Ability to maintain clinical measurements within normal limits will improve Outcome: Progressing Goal: Respiratory complications will improve Outcome: Progressing Goal: Cardiovascular complication will be avoided Outcome: Progressing   

## 2019-01-17 NOTE — Progress Notes (Signed)
PROGRESS NOTE    Anna Lucero  QJJ:[id number] DOB: 1976/03/14 DOA: 01/12/2019 PCP: Precious Gilding, PA   Brief Narrative:  HPI from Dr Frutoso Schatz a 43 y.o.femalewith a known history of sarcoidosis, bronchitis, GERD, rheumatoid arthritis, depression, anxiety and polycystic ovary syndromepresents to the emergency department for evaluation of shortness of breath. Patient was in a usual state of health until May 31 when she was hospitalized for Sirs with an unclear source. She was discharged home with 3 days of amoxicillin which she was compliant. Pt has had a 46-month history of cough associated with shortness of breath but states that her symptoms have become progressively worse since her discharge. She now complains of fevers, chills, cough, shortness of breath.There has been no recenttravel or sick contacts, or weight loss, night sweats. 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 She has been tested for covid twice, with negative results. Of note, pt CTA chest on 2 occassions have shown suspicion of sarcoidosis Vs lymphoma. Pt had a supraclavicular LN biopsy on 12/31/18 which was somewhat inconclusive but ??r/o lymphoma. Pt is scheduled to follow up with pulmonary as an outpt. In the ED, pt was noted to be febrile, tachycardic, and saturating well on RA. Covid negative. Pt was started on cefepime and Vanco for healthcare associated pneumonia. Pt admitted for further management.  Assessment & Plan:   Active Problems:   Sepsis ([place])   Sarcoid  Shortness of Breath  Acute Hypoxic Respiratory Failure  Sarcoidosis  Appreciate pulm recs, will give solumedrol for a few doses and see how she responds, then resume previously discussed taper (pt noted minimal improvement today since steroids were started - discussed with pulm) Improved on IV steroids today, will continue today and transition to PO steroids tomorrow  6/1  supraclavicular LN biopsy with granulomatous lymphadenitis - sarcoidosis favored (see report) 6/1 bx without monoclonal B cell population or abnormal T cell phenotype  Per pulm, recommended starting prednisone (40 mg and taper to 20 mg by time of d/c with plan for maintaining at this dose for 4-6 weeks) Initially concern for lymphoma as well, but per pulm, lower suspicion for lymphoma as pulm parenchymal involvement uncommon  Negative ACE  Sepsis likely 2/2 Possible HCAP with underlying Sarcoidosis Currently afebrile, no leukocytosis Fever 6/16 to 101.5 If recurrent fever, will plan to repeat blood cx procalcitonin negative BC X 2 NGTD x 4 COVID X 2 negative (LDH 213, ferritin 40, CRP 2.3, d-dimer 0.85, fibrinogen 562) Resp viral panel negative UA negative, UC no growth Sputum cx with resp flora  CTA chest neg for PE, with persistent mediastinal adenopathy and scattered bilateral pulmonary opacities most consistent with sarcoidosis Will d/c abx at this time  Continue O2, nebs as needed, singular, cough suppressant  Pulmonary consulted, appreciate recs Monitor closely  Hypomagnesemia Replace PRN  HTN Continue Norvasc  History of rheumatoid arthritis Held methotrexate due to sepsis Pt also on remicade  History ofdepression, anxiety ContinueAbilify, hold doxepin  History ofGERD ContinueProtonix  History of?bronchitis ContinueAllegra, Singulair, hold flonase  Obesity Lifestyle modification advised  Hypokalemia: replace, follow  DVT prophylaxis: lovenox Code Status: full  Family Communication: none at bedside Disposition Plan: pending further improvement in shortness of breath, hypoxia   Consultants:   PCCM  Procedures:  none  Antimicrobials: Anti-infectives (From admission, onward)   Start     Dose/Rate Route Frequency Ordered Stop   01/13/19 1600  vancomycin (VANCOCIN) 1,500 mg  in sodium chloride 0.9 % 500 mL IVPB  Status:  Discontinued      1,500 mg 250 mL/hr over 120 Minutes Intravenous Every 24 hours 01/13/19 0119 01/15/19 1147   01/13/19 0400  ceFEPIme (MAXIPIME) 2 g in sodium chloride 0.9 % 100 mL IVPB  Status:  Discontinued     2 g 200 mL/hr over 30 Minutes Intravenous Every 8 hours 01/13/19 0119 01/17/19 1108   01/12/19 2000  vancomycin (VANCOCIN) 1,500 mg in sodium chloride 0.9 % 500 mL IVPB     1,500 mg 250 mL/hr over 120 Minutes Intravenous  Once 01/12/19 1928 01/13/19 0045   01/12/19 1915  ceFEPIme (MAXIPIME) 2 g in sodium chloride 0.9 % 100 mL IVPB     2 g 200 mL/hr over 30 Minutes Intravenous  Once 01/12/19 1902 01/12/19 2039     Subjective: Feels better with IV solumedrol  Objective: Vitals:   01/17/19 0017 01/17/19 0510 01/17/19 0801 01/17/19 1434  BP:  (!) 141/89  (!) 120/95  Pulse: (!) 103 86  (!) 107  Resp: 20 18  (!) 22  Temp:  97.7 F (36.5 C)  98.2 F (36.8 C)  TempSrc:  Oral    SpO2:  97% 95% 98%  Weight:      Height:        Intake/Output Summary (Last 24 hours) at 01/17/2019 1517 Last data filed at 01/17/2019 0600 Gross per 24 hour  Intake 955.68 ml  Output 1 ml  Net 954.68 ml   Filed Weights   01/12/19 1900  Weight: 97.5 kg    Examination:  General: No acute distress. Cardiovascular: Heart sounds show a regular rate, and rhythm Lungs: Clear to auscultation bilaterally Abdomen: Soft, nontender, nondistended Neurological: Alert and oriented 3. Moves all extremities 4. Cranial nerves II through XII grossly intact. Skin: Warm and dry. No rashes or lesions. Extremities: No clubbing or cyanosis. No edema.  Psychiatric: Mood and affect are normal. Insight and judgment are appropriate.   Data Reviewed: I have personally reviewed following labs and imaging studies  CBC: Recent Labs  Lab 01/12/19 1853 01/13/19 0504 01/14/19 0434 01/15/19 0424 01/16/19 0430 01/17/19 0412  WBC 10.4 8.9 7.6 9.0 6.0 12.6*  NEUTROABS 7.5  --  5.3 6.4 3.4  --   HGB 14.3 12.5 12.2 11.9* 12.0 12.0   HCT 45.9 41.9 40.5 39.2 39.1 38.8  MCV 89.3 91.1 90.0 90.3 90.7 89.8  PLT 369 309 337 315 298 938   Basic Metabolic Panel: Recent Labs  Lab 01/13/19 0504 01/14/19 0434 01/15/19 0424 01/16/19 0430 01/17/19 0412  NA 138 137 139 138 136  K 3.6 4.3 3.9 3.4* 4.3  CL 111 107 107 106 105  CO2 22 24 24 22 23   GLUCOSE 114* 121* 101* 93 153*  BUN 8 10 13 10 15   CREATININE 0.70 0.64 0.71 0.69 0.60  CALCIUM 8.4* 8.9 9.0 8.7* 9.2  MG 1.6* 2.3  --   --  2.1  PHOS 3.0  --   --   --   --    GFR: Estimated Creatinine Clearance: 98.9 mL/min (by C-G formula based on SCr of 0.6 mg/dL). Liver Function Tests: Recent Labs  Lab 01/12/19 1853 01/13/19 0504 01/17/19 0412  AST 26 18 17   ALT 25 20 22   ALKPHOS 99 75 69  BILITOT 0.2* 0.3 0.3  PROT 8.9* 7.1 7.4  ALBUMIN 3.8 3.0* 2.8*   No results for input(s): LIPASE, AMYLASE in the last 168 hours. No results for  input(s): AMMONIA in the last 168 hours. Coagulation Profile: No results for input(s): INR, PROTIME in the last 168 hours. Cardiac Enzymes: Recent Labs  Lab 01/12/19 1853  TROPONINI <0.03   BNP (last 3 results) No results for input(s): PROBNP in the last 8760 hours. HbA1C: No results for input(s): HGBA1C in the last 72 hours. CBG: No results for input(s): GLUCAP in the last 168 hours. Lipid Profile: No results for input(s): CHOL, HDL, LDLCALC, TRIG, CHOLHDL, LDLDIRECT in the last 72 hours. Thyroid Function Tests: No results for input(s): TSH, T4TOTAL, FREET4, T3FREE, THYROIDAB in the last 72 hours. Anemia Panel: No results for input(s): VITAMINB12, FOLATE, FERRITIN, TIBC, IRON, RETICCTPCT in the last 72 hours. Sepsis Labs: Recent Labs  Lab 01/12/19 1853 01/13/19 0504  PROCALCITON <0.10  --   LATICACIDVEN 1.8 1.2    Recent Results (from the past 240 hour(s))  Blood culture (routine x 2)     Status: None (Preliminary result)   Collection Time: 01/12/19  6:52 PM   Specimen: BLOOD  Result Value Ref Range Status    Specimen Description   Final    BLOOD RIGHT ANTECUBITAL Performed at [facility], [place] [address] [place], [place] [address]    Special Requests   Final    BOTTLES DRAWN AEROBIC AND ANAEROBIC Blood Culture adequate volume Performed at [place] [address], [place], [place] [address]    Culture   Final    NO GROWTH 4 DAYS Performed at [facility], [place] [address] [place], [place] [address]    Report Status PENDING  Incomplete  Urine culture     Status: None   Collection Time: 01/12/19  6:53 PM   Specimen: Urine, Random  Result Value Ref Range Status   Specimen Description   Final    URINE, RANDOM Performed at [facility] [address] [place], [place] [address]    Special Requests   Final    NONE Performed at [facility], [place] [address], [place], [place] [address]    Culture   Final    NO GROWTH Performed at [facility], [place] [address], [place], [place] [address]    Report Status 01/14/2019 FINAL  Final  SARS Coronavirus 2 [facility] order, Performed in [facility] hospital lab)     Status: None   Collection Time: 01/12/19  6:53 PM   Specimen: Nasopharyngeal Swab  Result Value Ref Range Status   SARS Coronavirus 2 NEGATIVE NEGATIVE Final    Comment: (NOTE) If result is NEGATIVE SARS-CoV-2 target nucleic acids are NOT DETECTED. The SARS-CoV-2 RNA is generally detectable in upper and lower  respiratory specimens during the acute phase of infection. The lowest  concentration of SARS-CoV-2 viral copies this assay can detect is 250  copies / mL. A negative result does not preclude SARS-CoV-2 infection  and should not be used as the sole basis for treatment or other  patient management decisions.  A negative result may occur with  improper specimen collection / handling, submission of specimen other  than nasopharyngeal swab, presence of viral mutation(s) within the  areas targeted by this  assay, and inadequate number of viral copies  (<250 copies / mL). A negative result must be combined with clinical  observations, patient history, and epidemiological information. If result is POSITIVE SARS-CoV-2 target nucleic acids are DETECTED. The SARS-CoV-2 RNA is generally detectable in upper and lower  respiratory specimens dur ing the acute  phase of infection.  Positive  results are indicative of active infection with SARS-CoV-2.  Clinical  correlation with patient history and other diagnostic information is  necessary to determine patient infection status.  Positive results do  not rule out bacterial infection or co-infection with other viruses. If result is PRESUMPTIVE POSTIVE SARS-CoV-2 nucleic acids MAY BE PRESENT.   A presumptive positive result was obtained on the submitted specimen  and confirmed on repeat testing.  While 2019 novel coronavirus  (SARS-CoV-2) nucleic acids may be present in the submitted sample  additional confirmatory testing may be necessary for epidemiological  and / or clinical management purposes  to differentiate between  SARS-CoV-2 and other Sarbecovirus currently known to infect humans.  If clinically indicated additional testing with an alternate test  methodology [phone]) is advised. The SARS-CoV-2 RNA is generally  detectable in upper and lower respiratory sp ecimens during the acute  phase of infection. The expected result is Negative. Fact Sheet for Patients:  StrictlyIdeas.no Fact Sheet for Healthcare Providers: BankingDealers.co.za This test is not yet approved or cleared by the [place] FDA and has been authorized for detection and/or diagnosis of SARS-CoV-2 by FDA under an Emergency Use Authorization (EUA).  This EUA will remain in effect (meaning this test can be used) for the duration of the COVID-19 declaration under Section 564(b)(1) of the Act, 21 U.S.C. section 360bbb-3(b)(1),  unless the authorization is terminated or revoked sooner. Performed at [facility], [place] [address], [place], [place] [address]   Blood culture (routine x 2)     Status: None (Preliminary result)   Collection Time: 01/12/19  6:57 PM   Specimen: BLOOD  Result Value Ref Range Status   Specimen Description   Final    BLOOD LEFT ANTECUBITAL Performed at [facility], [place] [address], [place], [place] [address]    Special Requests   Final    BOTTLES DRAWN AEROBIC AND ANAEROBIC Blood Culture adequate volume Performed at [facility] [address], [place], [place] [address]    Culture   Final    NO GROWTH 4 DAYS Performed at [facility], [place] [address] [place], [place] [address]    Report Status PENDING  Incomplete  Culture, sputum-assessment     Status: None   Collection Time: 01/13/19  3:59 AM   Specimen: Sputum  Result Value Ref Range Status   Specimen Description SPUTUM  Final   Special Requests Immunocompromised  Final   Sputum evaluation   Final    THIS SPECIMEN IS ACCEPTABLE FOR SPUTUM CULTURE Performed at [facility], [place] [address] [place], [place] [address]    Report Status 01/14/2019 FINAL  Final  Culture, respiratory     Status: None   Collection Time: 01/13/19  3:59 AM   Specimen: SPU  Result Value Ref Range Status   Specimen Description   Final    SPUTUM Performed at [facility] [address] [place], [place] [address]    Special Requests   Final    Immunocompromised Reflexed from [phone] Performed at [facility], [place] [address], [place], [place] [address]    Gram Stain   Final    ABUNDANT WBC PRESENT,BOTH PMN AND MONONUCLEAR RARE GRAM POSITIVE COCCI    Culture   Final    FEW Consistent with normal respiratory flora. Performed at [facility], [place] [address], [place],  [address]    Report Status  01/16/2019 FINAL  Final   Respiratory Panel by PCR     Status: None   Collection Time: 01/13/19 12:24 PM   Specimen: Nasopharyngeal Swab; Respiratory  Result Value Ref Range Status   Adenovirus NOT DETECTED NOT DETECTED Final   Coronavirus 229E NOT DETECTED NOT DETECTED Final    Comment: (NOTE) The Coronavirus on the Respiratory Panel, DOES NOT test for the novel  Coronavirus (2019 nCoV)    Coronavirus HKU1 NOT DETECTED NOT DETECTED Final   Coronavirus NL63 NOT DETECTED NOT DETECTED Final   Coronavirus OC43 NOT DETECTED NOT DETECTED Final   Metapneumovirus NOT DETECTED NOT DETECTED Final   Rhinovirus / Enterovirus NOT DETECTED NOT DETECTED Final   Influenza A NOT DETECTED NOT DETECTED Final   Influenza B NOT DETECTED NOT DETECTED Final   Parainfluenza Virus 1 NOT DETECTED NOT DETECTED Final   Parainfluenza Virus 2 NOT DETECTED NOT DETECTED Final   Parainfluenza Virus 3 NOT DETECTED NOT DETECTED Final   Parainfluenza Virus 4 NOT DETECTED NOT DETECTED Final   Respiratory Syncytial Virus NOT DETECTED NOT DETECTED Final   Bordetella pertussis NOT DETECTED NOT DETECTED Final   Chlamydophila pneumoniae NOT DETECTED NOT DETECTED Final   Mycoplasma pneumoniae NOT DETECTED NOT DETECTED Final    Comment: Performed at [facility], [place]. [address] [place], [place] [address]  MRSA PCR Screening     Status: None   Collection Time: 01/15/19  8:54 AM   Specimen: Nasal Mucosa; Nasopharyngeal  Result Value Ref Range Status   MRSA by PCR NEGATIVE NEGATIVE Final    Comment:        The GeneXpert MRSA Assay (FDA approved for NASAL specimens only), is one component of a comprehensive MRSA colonization surveillance program. It is not intended to diagnose MRSA infection nor to guide or monitor treatment for MRSA infections. Performed at [facility], [place] [address] [place], [place] [address]          Radiology Studies: No results found.      Scheduled Meds: . amLODipine  5 mg Oral  Daily  . ARIPiprazole  5 mg Oral Daily  . enoxaparin (LOVENOX) injection  40 mg Subcutaneous Q24H  . folic acid  1 mg Oral Daily  . levalbuterol  0.63 mg Nebulization TID  . loratadine  10 mg Oral Daily  . methylPREDNISolone (SOLU-MEDROL) injection  40 mg Intravenous Q12H  . montelukast  10 mg Oral Daily   Continuous Infusions: . sodium chloride Stopped (01/17/19 0416)     LOS: 4 days    Time spent: over 23 min    Fayrene Helper, MD [facility] Pager AMION  If 7PM-7AM, please contact night-coverage [email or web address] Password [facility] 01/17/2019, 3:17 PM

## 2019-01-18 LAB — COMPREHENSIVE METABOLIC PANEL
ALT: 24 U/L (ref 0–44)
AST: 20 U/L (ref 15–41)
Albumin: 2.9 g/dL — ABNORMAL LOW (ref 3.5–5.0)
Alkaline Phosphatase: 70 U/L (ref 38–126)
Anion gap: 9 (ref 5–15)
BUN: 15 mg/dL (ref 6–20)
CO2: 22 mmol/L (ref 22–32)
Calcium: 9 mg/dL (ref 8.9–10.3)
Chloride: 105 mmol/L (ref 98–111)
Creatinine, Ser: 0.59 mg/dL (ref 0.44–1.00)
GFR calc Af Amer: >60 mL/min (ref >60)
GFR calc non Af Amer: >60 mL/min (ref >60)
Glucose, Bld: 127 mg/dL — ABNORMAL HIGH (ref 70–99)
Potassium: 4 mmol/L (ref 3.5–5.1)
Sodium: 136 mmol/L (ref 135–145)
Total Bilirubin: 0.1 mg/dL — ABNORMAL LOW (ref 0.3–1.2)
Total Protein: 7.3 g/dL (ref 6.5–8.1)

## 2019-01-18 LAB — CULTURE, BLOOD (ROUTINE X 2)
Culture: NO GROWTH
Culture: NO GROWTH
Special Requests: ADEQUATE
Special Requests: ADEQUATE

## 2019-01-18 LAB — CBC
HCT: 39.2 % (ref 36.0–46.0)
Hemoglobin: 11.8 g/dL — ABNORMAL LOW (ref 12.0–15.0)
MCH: 26.9 pg (ref 26.0–34.0)
MCHC: 30.1 g/dL (ref 30.0–36.0)
MCV: 89.3 fL (ref 80.0–100.0)
Platelets: 367 10*3/uL (ref 150–400)
RBC: 4.39 MIL/uL (ref 3.87–5.11)
RDW: 12.9 % (ref 11.5–15.5)
WBC: 17.2 10*3/uL — ABNORMAL HIGH (ref 4.0–10.5)
nRBC: 0 % (ref 0.0–0.2)

## 2019-01-18 LAB — MAGNESIUM: Magnesium: 2.1 mg/dL (ref 1.7–2.4)

## 2019-01-18 MED ORDER — HYDROCODONE-HOMATROPINE 5-1.5 MG/5ML PO SYRP: 5.0000 mL | ORAL_SOLUTION | Freq: Four times a day (QID) | ORAL | 0 refills | Status: AC | PRN

## 2019-01-18 MED ORDER — FOLIC ACID 1 MG PO TABS: 1.0000 mg | ORAL_TABLET | Freq: Every day | ORAL | 0 refills | Status: AC

## 2019-01-18 MED ORDER — ACETAMINOPHEN 500 MG PO TABS: 1000.0000 mg | ORAL_TABLET | Freq: Three times a day (TID) | ORAL | 0 refills | Status: DC | PRN

## 2019-01-18 MED ORDER — PREDNISONE 20 MG PO TABS: ORAL_TABLET | ORAL | 0 refills | Status: DC

## 2019-01-18 NOTE — Progress Notes (Signed)
Pt discharged to home, instructions reviewed with patient. Acknowledge understanding. Prescription reviewed and returned MD visit acknowledged. SRP, RN

## 2019-01-18 NOTE — Progress Notes (Signed)
Physical Therapy Treatment Patient Details Name: Anna Lucero MRN: [id number] DOB: 08-07-1975 Today's Date: 01/18/2019    History of Present Illness Anna Lucero is a 43 y.o. female with a known history of sarcoidosis, bronchitis, GERD, rheumatoid arthritis, depression, anxiety and polycystic ovary syndrome presents to the emergency department for evaluation of shortness of breath.    PT Comments    Assisted OOB to amb a greater distance.  Tolerated distance well.  Follow Up Recommendations  No PT follow up     Equipment Recommendations  None recommended by PT    Recommendations for Other Services       Precautions / Restrictions      Mobility  Bed Mobility Overal bed mobility: Modified Independent             General bed mobility comments: self able  Transfers Overall transfer level: Needs assistance Equipment used: None Transfers: Sit to/from Stand;Stand Pivot Transfers Sit to Stand: Modified independent (Device/Increase time) Stand pivot transfers: Modified independent (Device/Increase time)       General transfer comment: good safety cognition  Ambulation/Gait Ambulation/Gait assistance: Modified independent (Device/Increase time);Supervision Gait Distance (Feet): 450 Feet Assistive device: None Gait Pattern/deviations: Step-through pattern Gait velocity: WFL   General Gait Details: tolerated an increased distance and no AD   Stairs             Wheelchair Mobility    Modified Rankin (Stroke Patients Only)       Balance                                            Cognition Arousal/Alertness: Awake/alert Behavior During Therapy: WFL for tasks assessed/performed Overall Cognitive Status: Within Functional Limits for tasks assessed                                        Exercises      General Comments        Pertinent Vitals/Pain Pain Assessment: No/denies pain    Home Living                       Prior Function            PT Goals (current goals can now be found in the care plan section) Progress towards PT goals: Progressing toward goals    Frequency    Min 3X/week      PT Plan Current plan remains appropriate    Co-evaluation              AM-PAC PT "6 Clicks" Mobility   Outcome Measure  Help needed turning from your back to your side while in a flat bed without using bedrails?: None Help needed moving from lying on your back to sitting on the side of a flat bed without using bedrails?: None Help needed moving to and from a bed to a chair (including a wheelchair)?: None Help needed standing up from a chair using your arms (e.g., wheelchair or bedside chair)?: None Help needed to walk in hospital room?: None Help needed climbing 3-5 steps with a railing? : A Little 6 Click Score: 23    End of Session Equipment Utilized During Treatment: Gait belt Activity Tolerance: Patient tolerated treatment well Patient left: in bed;with call bell/phone within  reach   PT Visit Diagnosis: Other abnormalities of gait and mobility (R26.89)     Time: 1025-1036 PT Time Calculation (min) (ACUTE ONLY): 11 min  Charges:  $Gait Training: 8-22 mins                     Rica Koyanagi  PTA Acute  Rehabilitation Services Pager      [phone] Office      [phone]

## 2019-01-18 NOTE — Plan of Care (Signed)
  Problem: Clinical Measurements: Goal: Diagnostic test results will improve Outcome: Completed/Met Goal: Signs and symptoms of infection will decrease Outcome: Completed/Met   Problem: Respiratory: Goal: Ability to maintain adequate ventilation will improve Outcome: Completed/Met   Problem: Health Behavior/Discharge Planning: Goal: Ability to manage health-related needs will improve Outcome: Completed/Met   Problem: Clinical Measurements: Goal: Ability to maintain clinical measurements within normal limits will improve Outcome: Completed/Met Goal: Will remain free from infection Outcome: Completed/Met Goal: Respiratory complications will improve Outcome: Completed/Met Goal: Cardiovascular complication will be avoided Outcome: Completed/Met   Problem: Activity: Goal: Risk for activity intolerance will decrease Outcome: Completed/Met   Problem: Nutrition: Goal: Adequate nutrition will be maintained Outcome: Completed/Met   Problem: Coping: Goal: Level of anxiety will decrease Outcome: Completed/Met   Problem: Elimination: Goal: Will not experience complications related to bowel motility Outcome: Completed/Met Goal: Will not experience complications related to urinary retention Outcome: Completed/Met   Problem: Pain Managment: Goal: General experience of comfort will improve Outcome: Completed/Met   Problem: Safety: Goal: Ability to remain free from injury will improve Outcome: Completed/Met   Problem: Skin Integrity: Goal: Risk for impaired skin integrity will decrease Outcome: Completed/Met   Problem: Activity: Goal: Ability to tolerate increased activity will improve Outcome: Completed/Met   Problem: Clinical Measurements: Goal: Ability to maintain a body temperature in the normal range will improve Outcome: Completed/Met   Problem: Respiratory: Goal: Ability to maintain adequate ventilation will improve Outcome: Completed/Met Goal: Ability to  maintain a clear airway will improve Outcome: Completed/Met   Problem: Education: Goal: Ability to state activities that reduce stress will improve Outcome: Completed/Met   Problem: Coping: Goal: Ability to identify and develop effective coping behavior will improve Outcome: Completed/Met   Problem: Self-Concept: Goal: Ability to identify factors that promote anxiety will improve Outcome: Completed/Met Goal: Level of anxiety will decrease Outcome: Completed/Met Goal: Ability to modify response to factors that promote anxiety will improve Outcome: Completed/Met   Problem: Acute Rehab PT Goals(only PT should resolve) Goal: Pt Will Ambulate Outcome: Completed/Met Goal: Pt Will Go Up/Down Stairs Outcome: Completed/Met

## 2019-01-18 NOTE — Discharge Summary (Signed)
Physician Discharge Summary  Anna Lucero BDZ:[id number] DOB: 1975-11-03 DOA: 01/12/2019  PCP: Precious Gilding, PA  Admit date: 01/12/2019 Discharge date: 01/18/2019  Time spent: 40 minutes  Recommendations for Outpatient Follow-up:  1. Follow outpatient CBC/CMP 2. Follow up response to steroids as outpatient - if on 20 mg daily for >1 month, consider PCP prophylaxis 3. Ensure follow up with pulmonology outpatient for sarcoidosis  4. Follow up biopsies   Discharge Diagnoses:  Active Problems:   Sepsis ([place])   Sarcoid   Discharge Condition: stable  Diet recommendation: heart healthy  Filed Weights   01/12/19 1900  Weight: 97.5 kg    History of present illness:  HPI from Dr Frutoso Schatz Anna Lucero 43 y.o.femalewith [name] known history of sarcoidosis, bronchitis, GERD, rheumatoid arthritis, depression, anxiety and polycystic ovary syndromepresents to the emergency department for evaluation of shortness of breath. Patient was in [place] usual state of health until May 31 when she was hospitalized for Sirs with an unclear source. She was discharged home with 3 days of amoxicillin which she was compliant. Pt has had [name] 63-month history of cough associated with shortness of breath but states that her symptoms have become progressively worse since her discharge. She now complains of fevers, chills, cough, shortness of breath.There has been no recenttravel or sick contacts, or weight loss, night sweats. Patient was offered Aylen Rambert job at [employer] [facility], but has not started and has no exposure to Covid. She has been tested for covid twice, with negative results. Of note, pt CTA chest on 2 occassions have shown suspicion of sarcoidosis Vs lymphoma. Pt had [place] [facility] supraclavicular LN biopsy on 12/31/18 which was somewhat inconclusive but ??r/o lymphoma. Pt is scheduled to follow up with pulmonary as an outpt. In the ED, pt was noted to be febrile, tachycardic, and saturating well on RA. Covid negative. Pt was  started on cefepime and Vanco for healthcare associated pneumonia. Pt admitted for further management.  She was admitted for shortness of breath.  She was seen by pulmonology who recommended steroids for sarcoidosis on 6/15.  She also received abx for possible pneumonia.  Improved with steroids, d/c with plan to follow up with pulm.  Hospital Course:  Shortness of Breath  Acute Hypoxic Respiratory Failure  Sarcoidosis  Appreciate pulm recs, will give solumedrol for [name] few doses and see how she responds, then resume previously discussed taper (pt noted minimal improvement today since steroids were started - discussed with pulm) Improved on IV steroids -> transition to prednisone taper today - d/c with taper to 20 mg - if pt on 20 mg greater than 1 month, need to consider PCP prophylaxis 6/1 supraclavicular LN biopsy with granulomatous lymphadenitis - sarcoidosis favored (see report) 6/1 bx without monoclonal B cell population or abnormal T cell phenotype  Per pulm, recommended starting prednisone (40 mg and taper to 20 mg by time of d/c with plan for maintaining at this dose for 4-6 weeks) Initially concern for lymphoma as well, but per pulm, lower suspicion for lymphoma as pulm parenchymal involvement uncommon  Negative ACE  Sepsis likely 2/2 Possible HCAP with underlying Sarcoidosis Currently afebrile,no leukocytosis Fever 6/16 to 101.5 If recurrent fever, will plan to repeat blood cx procalcitonin negative BC X 2 NGTD  COVID X 2 negative (LDH 213, ferritin 40, CRP 2.3, d-dimer 0.85, fibrinogen 562) Resp viral panel negative UA negative, UC no growth Sputum cx with resp flora  CTA chest neg for PE, with persistent mediastinal adenopathy and scattered bilateral  pulmonary opacities most consistent with sarcoidosis Will d/c abx at this time Continue O2, nebs as needed, singular,cough suppressant Pulmonary consulted, appreciate recs Monitor closely  Hypomagnesemia Replace  PRN  HTN Continue Norvasc  History of rheumatoid arthritis Resume methotrexate at d/c Pt also on remicade  History ofdepression, anxiety ContinueAbilify, hold doxepin  History ofGERD ContinueProtonix  History of?bronchitis ContinueAllegra, Singulair, hold flonase  Obesity Lifestyle modification advised  Hypokalemia: replace, follow  Procedures: none  Consultations:  PCCM  Discharge Exam: Vitals:   01/18/19 0534 01/18/19 0756  BP: 124/80   Pulse: 78   Resp: 20   Temp: 97.7 F (36.5 C)   SpO2: 97% 95%   No SOB.  Notes improvement in sx. Feels better.  Has pulm f/u on Monday. Discussed d/c plan and recs  General: No acute distress. Cardiovascular: Heart sounds show [name] regular rate, and rhythm.  Lungs: Clear to auscultation bilaterally Abdomen: Soft, nontender, nondistended  Neurological: Alert and oriented 3. Moves all extremities 4. Cranial nerves II through XII grossly intact. Skin: Warm and dry. No rashes or lesions. Extremities: No clubbing or cyanosis. No edema.  Discharge Instructions   Discharge Instructions    Call MD for:  difficulty breathing, headache or visual disturbances   Complete by: As directed    Call MD for:  extreme fatigue   Complete by: As directed    Call MD for:  hives   Complete by: As directed    Call MD for:  persistant dizziness or light-headedness   Complete by: As directed    Call MD for:  persistant nausea and vomiting   Complete by: As directed    Call MD for:  redness, tenderness, or signs of infection (pain, swelling, redness, odor or green/yellow discharge around incision site)   Complete by: As directed    Call MD for:  severe uncontrolled pain   Complete by: As directed    Call MD for:  temperature >100.4   Complete by: As directed    Diet - low sodium heart healthy   Complete by: As directed    Discharge instructions   Complete by: As directed    You were seen for shortness of breath.  We  think this was due to your sarcoidosis.  You had antibiotics for possible pneumonia, but have improved.  We've placed you on Nazeer Romney steroid taper.  Please follow up with pulmonology as an outpatient to follow up your steroid dose.  You may need prophylactic antibiotics at some point, please discuss this with pulmonology in follow up.  Return for new, recurrent, or worsening symptoms.  Please ask your PCP to request records from this hospitalization so they know what was done and what the next steps will be.   Increase activity slowly   Complete by: As directed      Allergies as of 01/18/2019      Reactions   Lamictal [lamotrigine]    Skin rash, likely not SJS but concerning   Celebrex [celecoxib] Other (See Comments)   Chest pain   Spironolactone Other (See Comments)   Bleeding      Medication List    TAKE these medications   acetaminophen 500 MG tablet Commonly known as: TYLENOL Take 2 tablets (1,000 mg total) by mouth every 8 (eight) hours as needed for moderate pain. What changed: when to take this   amLODipine 5 MG tablet Commonly known as: NORVASC Take 1 tablet (5 mg total) by mouth daily.   ARIPiprazole 5 MG tablet  Commonly known as: ABILIFY Take 1 tablet (5 mg total) by mouth daily.   beclomethasone 40 MCG/ACT inhaler Commonly known as: QVAR Inhale 1 puff into the lungs 2 (two) times daily.   diclofenac 75 MG EC tablet Commonly known as: VOLTAREN Take 150 mg by mouth 2 (two) times Oletta Buehring day.   doxepin 25 MG capsule Commonly known as: SINEQUAN Take 1 capsule (25 mg total) by mouth at bedtime as needed (insomnia).   folic acid 1 MG tablet Commonly known as: FOLVITE Take 1 tablet (1 mg total) by mouth daily for 30 days.   HYDROcodone-homatropine 5-1.5 MG/5ML syrup Commonly known as: HYCODAN Take 5 mLs by mouth every 6 (six) hours as needed for up to 3 days for cough.   methotrexate 2.5 MG tablet Commonly known as: RHEUMATREX Take 20 mg by mouth every Saturday.    omeprazole 40 MG capsule Commonly known as: PRILOSEC Take 40 mg by mouth daily.   predniSONE 20 MG tablet Commonly known as: DELTASONE Take 2 tablets (40 mg total) by mouth daily with breakfast for 3 days, THEN 1.5 tablets (30 mg total) daily with breakfast for 3 days, THEN 1 tablet (20 mg total) daily with breakfast for 30 days. Follow up with pulmonology for further instructions regarding your steroids. Start taking on: January 19, 2019 What changed:   medication strength  See the new instructions.   albuterol (2.5 MG/3ML) 0.083% nebulizer solution Commonly known as: PROVENTIL Take 3 mLs by nebulization every 6 (six) hours as needed for wheezing or shortness of breath.   ProAir RespiClick 559 (90 Base) MCG/ACT Aepb Generic drug: Albuterol Sulfate Inhale 2 puffs into the lungs every 6 (six) hours as needed (shortness of breath).   REMICADE IV Inject into the vein every 6 (six) weeks.      Allergies  Allergen Reactions  . 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 Celebrex [Celecoxib] Other (See Comments)    Chest pain  . Spironolactone Other (See Comments)    Bleeding      The results of significant diagnostics from this hospitalization (including imaging, microbiology, ancillary and laboratory) are listed below for reference.    Significant Diagnostic Studies: Dg Chest 2 View  Result Date: 12/30/2018 CLINICAL DATA:  Cough and shortness of breath for 3 months. Rheumatoid arthritis. EXAM: CHEST - 2 VIEW COMPARISON:  None. FINDINGS: Heart size is normal. Bilateral hilar lymphadenopathy is seen. Mild tracheal deviation to the left is suspicious for right paratracheal lymphadenopathy. Both lungs are clear. No evidence of pleural effusion. IMPRESSION: No active lung disease. Bilateral hilar lymphadenopathy and suspected right paratracheal lymphadenopathy. Consider chest CT with contrast for further evaluation. Electronically Signed   By: Earle Gell  M.D.   On: 12/30/2018 01:59   Ct Angio Chest Pe W/cm &/or Wo Cm  Result Date: 01/12/2019 CLINICAL DATA:  Shortness of breath EXAM: CT ANGIOGRAPHY CHEST WITH CONTRAST TECHNIQUE: Multidetector CT imaging of the chest was performed using the standard protocol during bolus administration of intravenous contrast. Multiplanar CT image reconstructions and MIPs were obtained to evaluate the vascular anatomy. CONTRAST:  155mL OMNIPAQUE IOHEXOL 350 MG/ML SOLN COMPARISON:  12/30/2018 FINDINGS: Cardiovascular: Evaluation is significantly limited by motion artifact. Given this limitation no definite PE was identified. The heart size is stable from prior study. There is no pericardial effusion. There is significant narrowing of the right upper lobe pulmonary artery which is stable from prior study. Mediastinum/Nodes: Again identified are enlarged mediastinal  and hilar lymph nodes. There are enlarged right supraclavicular lymph nodes. There is Ardean Simonich small left-sided thyroid nodule. No significant axillary adenopathy. Lungs/Pleura: The lung volumes are low. Again noted are scattered pulmonary opacities bilaterally. There is significant narrowing of the bilateral lower lobe bronchi secondary to mass effect from the nearby adenopathy. There is no pneumothorax. Upper Abdomen: No acute abnormality. Musculoskeletal: There is an old healed right-sided rib fracture. Review of the MIP images confirms the above findings. IMPRESSION: 1. Examination limited by motion artifact. 2. No definite PE given the limitations described above. 3. Persistent mediastinal adenopathy and scattered bilateral pulmonary opacities is again most consistent with sarcoidosis. The patient's adenopathy causes significant narrowing of the right upper lobe pulmonary artery as well as mild to moderate narrowing of the bilateral lower lobe bronchi. Electronically Signed   By: Constance Holster M.D.   On: 01/12/2019 23:57   Ct Angio Chest Pe W And/or Wo  Contrast  Result Date: 12/30/2018 CLINICAL DATA:  Cough, shortness of breath EXAM: CT ANGIOGRAPHY CHEST WITH CONTRAST TECHNIQUE: Multidetector CT imaging of the chest was performed using the standard protocol during bolus administration of intravenous contrast. Multiplanar CT image reconstructions and MIPs were obtained to evaluate the vascular anatomy. CONTRAST:  175mL OMNIPAQUE IOHEXOL 350 MG/ML SOLN COMPARISON:  Chest radiographs dated 12/30/2018 FINDINGS: Cardiovascular: Evaluation is mildly constrained by respiratory motion. Satisfactory opacification the bilateral pulmonary arteries to the segmental level. No evidence of pulmonary embolism. Heart is normal in size.  No pericardial effusion. No evidence thoracic aortic aneurysm or dissection. Mediastinum/Nodes: Extensive thoracic lymphadenopathy, including: --1.4 cm short axis right supraclavicular node (series 4/image 3) --2.2 cm short axis high right paratracheal node (series 4/image 12) --2.1 cm short axis prevascular node (series 4/image 21) --2.7 cm short axis right hilar node (series 4/image 30) --2.4 cm short axis subcarinal node (series 4/image 32) --2.2 cm short axis left infrahilar node (series 4/image 35) This appearance is nonspecific, with sarcoidosis and lymphoma considered most likely. No suspicious axillary lymphadenopathy. Visualized thyroid is unremarkable. Lungs/Pleura: Evaluation of the lung parenchyma is constrained by respiratory motion. Thickening of the peribronchovascular interstitium with peribronchovascular nodularity in the lungs bilaterally, upper lobe predominant (for example, series 8/image 41). Dominant nodule measures 10 mm in the central right upper lobe (series 8/image 30). This appearance favors sarcoidosis. No focal consolidation. No pleural effusion or pneumothorax. Upper Abdomen: Visualized upper abdomen is grossly unremarkable. Spleen is normal in size. Musculoskeletal: Visualized osseous structures are within normal  limits. Review of the MIP images confirms the above findings. IMPRESSION: No evidence of pulmonary embolism. Peribronchovascular nodularity with thickening of the peribronchovascular interstitium in the lungs bilaterally. Associated thoracic lymphadenopathy, as described above. This combination of findings favors sarcoidosis. If tissue diagnosis is required, consider excision/sampling of the right supraclavicular node. Lymphoma is considered possible but considered less likely given the overall constellation of findings. Electronically Signed   By: Julian Hy M.D.   On: 12/30/2018 06:44   Ct Abdomen Pelvis W Contrast  Result Date: 12/31/2018 CLINICAL DATA:  Enlarged lymph nodes EXAM: CT ABDOMEN AND PELVIS WITH CONTRAST TECHNIQUE: Multidetector CT imaging of the abdomen and pelvis was performed using the standard protocol following bolus administration of intravenous contrast. CONTRAST:  136mL OMNIPAQUE IOHEXOL 300 MG/ML  SOLN COMPARISON:  CT chest, 12/30/2018 FINDINGS: Lower chest: Small bilateral pleural effusions and associated atelectasis or consolidation. Enlarged hilar and subcarinal lymph nodes partially imaged in the lower chest. Hepatobiliary: No focal liver abnormality is seen. No gallstones, gallbladder wall  thickening, or biliary dilatation. Pancreas: Unremarkable. No pancreatic ductal dilatation or surrounding inflammatory changes. Spleen: Normal in size without focal abnormality. Adrenals/Urinary Tract: Adrenal glands are unremarkable. Kidneys are normal, without renal calculi, focal lesion, or hydronephrosis. Bladder is unremarkable. Stomach/Bowel: Stomach is within normal limits. Appendix appears normal. No evidence of bowel wall thickening, distention, or inflammatory changes. Vascular/Lymphatic: No significant vascular findings are present. No enlarged abdominal or pelvic lymph nodes. Reproductive: No mass or other abnormality. Bilateral ovarian cysts or follicles. Other: No abdominal wall  hernia or abnormality. No abdominopelvic ascites. Musculoskeletal: No acute or significant osseous findings. IMPRESSION: 1.  No evidence of abdominal or pelvic lymphadenopathy. 2. Small bilateral pleural effusions and associated atelectasis or consolidation. Enlarged hilar and subcarinal lymph nodes partially imaged in the lower chest, as seen on prior examination of the chest. Electronically Signed   By: Eddie Candle M.D.   On: 12/31/2018 13:32   Dg Chest Portable 1 View  Result Date: 01/12/2019 CLINICAL DATA:  Cough and shortness of breath EXAM: PORTABLE CHEST 1 VIEW COMPARISON:  Chest x-ray dated 12/30/2018 FINDINGS: Heart size is normal. Again identified is bilateral hilar adenopathy. There is no pneumothorax. No large pleural effusion. No focal area of consolidation. IMPRESSION: Stable appearance of the chest with no active disease. Electronically Signed   By: Constance Holster M.D.   On: 01/12/2019 20:18   [place] Core Biopsy (lymph Nodes)  Result Date: 12/31/2018 INDICATION: No known primary, now with mediastinal, hilar and right supraclavicular ink adenopathy worrisome for either sarcoid versus lymphoma. Please perform ultrasound-guided biopsy for tissue diagnostic purposes. EXAM: ULTRASOUND-GUIDED RIGHT SUPRACLAVICULAR LYMPH NODE BIOPSY COMPARISON:  Chest CT - 12/30/2018; CT of the abdomen and pelvis-12/31/2018 MEDICATIONS: None ANESTHESIA/SEDATION: Moderate (conscious) sedation was employed during this procedure. Dorice Stiggers total of Versed 2 mg and Fentanyl 100 mcg was administered intravenously. Moderate Sedation Time: 10 minutes. The patient's level of consciousness and vital signs were monitored continuously by radiology nursing throughout the procedure under my direct supervision. COMPLICATIONS: None immediate. TECHNIQUE: Informed written consent was obtained from the patient after Gabriella Guile discussion of the risks, benefits and alternatives to treatment. Questions regarding the procedure were encouraged and  answered. Initial ultrasound scanning demonstrated an approximately 1.5 x 1.0 cm right supraclavicular lymph node correlating with the dominant lymph node seen on preceding chest CT image 3, series 4). An ultrasound image was saved for documentation purposes. The procedure was planned. Trust Leh timeout was performed prior to the initiation of the procedure. The operative was prepped and draped in the usual sterile fashion, and Acire Tang sterile drape was applied covering the operative field. Maila Dukes timeout was performed prior to the initiation of the procedure. Local anesthesia was provided with 1% lidocaine with epinephrine. Under direct ultrasound guidance, an 18 gauge core needle device was utilized to obtain to obtain 4 core needle biopsies of the dominant right supraclavicular lymph node. The samples were placed in saline and submitted to pathology. The needle was removed and hemostasis was achieved with manual compression. Post procedure scan was negative for significant hematoma. Samyia Motter dressing was placed. The patient tolerated the procedure well without immediate postprocedural complication. IMPRESSION: Technically successful ultrasound guided biopsy of dominant right supraclavicular lymph node. Note, procedure somewhat challenging due to patient body habitus as well as significant respiratory excursion. If additional tissue is required for diagnosis, further evaluation with PET-CT and/or bronchoscopy could be performed as indicated. Electronically Signed   By: Sandi Mariscal M.D.   On: 12/31/2018 17:08    Microbiology: Recent  Results (from the past 240 hour(s))  Blood culture (routine x 2)     Status: None   Collection Time: 01/12/19  6:52 PM   Specimen: BLOOD  Result Value Ref Range Status   Specimen Description   Final    BLOOD RIGHT ANTECUBITAL Performed at [facility], [place] [address], [place], [place] [address]    Special Requests   Final    BOTTLES DRAWN AEROBIC AND ANAEROBIC Blood Culture adequate  volume Performed at [place] [address], [place], [place] [address]    Culture   Final    NO GROWTH 5 DAYS Performed at [facility], [place] [address] [place], [place] [address]    Report Status 01/18/2019 FINAL  Final  Urine culture     Status: None   Collection Time: 01/12/19  6:53 PM   Specimen: Urine, Random  Result Value Ref Range Status   Specimen Description   Final    URINE, RANDOM Performed at [facility] [address], [place], [place] [address]    Special Requests   Final    NONE Performed at [facility], [place] [address], [place], [place] [address]    Culture   Final    NO GROWTH Performed at [facility], [place] [address] [place], [place] [address]    Report Status 01/14/2019 FINAL  Final  SARS Coronavirus 2 [facility] order, Performed in [place] hospital lab)     Status: None   Collection Time: 01/12/19  6:53 PM   Specimen: Nasopharyngeal Swab  Result Value Ref Range Status   SARS Coronavirus 2 NEGATIVE NEGATIVE Final    Comment: (NOTE) If result is NEGATIVE SARS-CoV-2 target nucleic acids are NOT DETECTED. The SARS-CoV-2 RNA is generally detectable in upper and lower  respiratory specimens during the acute phase of infection. The lowest  concentration of SARS-CoV-2 viral copies this assay can detect is 250  copies / mL. Shonique Pelphrey negative result does not preclude SARS-CoV-2 infection  and should not be used as the sole basis for treatment or other  patient management decisions.  Alantis Bethune negative result may occur with  improper specimen collection / handling, submission of specimen other  than nasopharyngeal swab, presence of viral mutation(s) within the  areas targeted by this assay, and inadequate number of viral copies  (<250 copies / mL). Kaleem Sartwell negative result must be combined with clinical  observations, patient history, and epidemiological information. If result is POSITIVE SARS-CoV-2  target nucleic acids are DETECTED. The SARS-CoV-2 RNA is generally detectable in upper and lower  respiratory specimens dur ing the acute phase of infection.  Positive  results are indicative of active infection with SARS-CoV-2.  Clinical  correlation with patient history and other diagnostic information is  necessary to determine patient infection status.  Positive results do  not rule out bacterial infection or co-infection with other viruses. If result is PRESUMPTIVE POSTIVE SARS-CoV-2 nucleic acids MAY BE PRESENT.   Adelyna Brockman presumptive positive result was obtained on the submitted specimen  and confirmed on repeat testing.  While 2019 novel coronavirus  (SARS-CoV-2) nucleic acids may be present in the submitted sample  additional confirmatory testing may be necessary for epidemiological  and / or clinical management purposes  to differentiate between  SARS-CoV-2 and other Sarbecovirus currently known to infect humans.  If clinically indicated additional testing with an alternate test  methodology [phone]) is advised. The SARS-CoV-2 RNA is generally  detectable  in upper and lower respiratory sp ecimens during the acute  phase of infection. The expected result is Negative. Fact Sheet for Patients:  StrictlyIdeas.no Fact Sheet for Healthcare Providers: BankingDealers.co.za This test is not yet approved or cleared by the [place] FDA and has been authorized for detection and/or diagnosis of SARS-CoV-2 by FDA under an Emergency Use Authorization (EUA).  This EUA will remain in effect (meaning this test can be used) for the duration of the COVID-19 declaration under Section 564(b)(1) of the Act, 21 U.S.C. section 360bbb-3(b)(1), unless the authorization is terminated or revoked sooner. Performed at [facility], [place] [address] [place], [place] [address]   Blood culture (routine x 2)     Status: None   Collection Time:  01/12/19  6:57 PM   Specimen: BLOOD  Result Value Ref Range Status   Specimen Description   Final    BLOOD LEFT ANTECUBITAL Performed at [facility], [place] [address] [place], [place] [address]    Special Requests   Final    BOTTLES DRAWN AEROBIC AND ANAEROBIC Blood Culture adequate volume Performed at [facility] [address] [place], [place] [address]    Culture   Final    NO GROWTH 5 DAYS Performed at [facility], [place] [address], [place], [place] [address]    Report Status 01/18/2019 FINAL  Final  Culture, sputum-assessment     Status: None   Collection Time: 01/13/19  3:59 AM   Specimen: Sputum  Result Value Ref Range Status   Specimen Description SPUTUM  Final   Special Requests Immunocompromised  Final   Sputum evaluation   Final    THIS SPECIMEN IS ACCEPTABLE FOR SPUTUM CULTURE Performed at [facility], [place] [address], [place], [place] [address]    Report Status 01/14/2019 FINAL  Final  Culture, respiratory     Status: None   Collection Time: 01/13/19  3:59 AM   Specimen: SPU  Result Value Ref Range Status   Specimen Description   Final    SPUTUM Performed at [facility] [address] [place], [place] [address]    Special Requests   Final    Immunocompromised Reflexed from [id number] Performed at [facility], [place] [address] [place], [place] [address]    Gram Stain   Final    ABUNDANT WBC PRESENT,BOTH PMN AND MONONUCLEAR RARE GRAM POSITIVE COCCI    Culture   Final    FEW Consistent with normal respiratory flora. Performed at [facility], [place] [address], [place], [place] [address]    Report Status 01/16/2019 FINAL  Final  Respiratory Panel by PCR     Status: None   Collection Time: 01/13/19 12:24 PM   Specimen: Nasopharyngeal Swab; Respiratory  Result Value Ref Range Status   Adenovirus NOT DETECTED NOT DETECTED Final   Coronavirus 229E NOT DETECTED NOT  DETECTED Final    Comment: (NOTE) The Coronavirus on the Respiratory Panel, DOES NOT test for the novel  Coronavirus (2019 nCoV)    Coronavirus HKU1 NOT DETECTED NOT DETECTED Final   Coronavirus NL63 NOT DETECTED NOT DETECTED Final   Coronavirus OC43 NOT DETECTED NOT DETECTED Final   Metapneumovirus NOT DETECTED NOT DETECTED Final   Rhinovirus / Enterovirus NOT DETECTED NOT DETECTED Final   Influenza Brenya Taulbee NOT DETECTED NOT DETECTED Final   Influenza B NOT DETECTED NOT DETECTED Final   Parainfluenza Virus 1 NOT DETECTED NOT DETECTED  Final   Parainfluenza Virus 2 NOT DETECTED NOT DETECTED Final   Parainfluenza Virus 3 NOT DETECTED NOT DETECTED Final   Parainfluenza Virus 4 NOT DETECTED NOT DETECTED Final   Respiratory Syncytial Virus NOT DETECTED NOT DETECTED Final   Bordetella pertussis NOT DETECTED NOT DETECTED Final   Chlamydophila pneumoniae NOT DETECTED NOT DETECTED Final   Mycoplasma pneumoniae NOT DETECTED NOT DETECTED Final    Comment: Performed at [facility], [place] [address] [place], [place] [address]  MRSA PCR Screening     Status: None   Collection Time: 01/15/19  8:54 AM   Specimen: Nasal Mucosa; Nasopharyngeal  Result Value Ref Range Status   MRSA by PCR NEGATIVE NEGATIVE Final    Comment:        The GeneXpert MRSA Assay (FDA approved for NASAL specimens only), is one component of Margues Filippini comprehensive MRSA colonization surveillance program. It is not intended to diagnose MRSA infection nor to guide or monitor treatment for MRSA infections. Performed at [facility], [place] [address], [place], [place] [address]      Labs: Basic Metabolic Panel: Recent Labs  Lab 01/13/19 0504 01/14/19 0434 01/15/19 0424 01/16/19 0430 01/17/19 0412 01/18/19 0411  NA 138 137 139 138 136 136  K 3.6 4.3 3.9 3.4* 4.3 4.0  CL 111 107 107 106 105 105  CO2 22 24 24 22 23 22   GLUCOSE 114* 121* 101* 93 153* 127*  BUN 8 10 13 10 15 15   CREATININE 0.70 0.64 0.71  0.69 0.60 0.59  CALCIUM 8.4* 8.9 9.0 8.7* 9.2 9.0  MG 1.6* 2.3  --   --  2.1 2.1  PHOS 3.0  --   --   --   --   --    Liver Function Tests: Recent Labs  Lab 01/12/19 1853 01/13/19 0504 01/17/19 0412 01/18/19 0411  AST 26 18 17 20   ALT 25 20 22 24   ALKPHOS 99 75 69 70  BILITOT 0.2* 0.3 0.3 0.1*  PROT 8.9* 7.1 7.4 7.3  ALBUMIN 3.8 3.0* 2.8* 2.9*   No results for input(s): LIPASE, AMYLASE in the last 168 hours. No results for input(s): AMMONIA in the last 168 hours. CBC: Recent Labs  Lab 01/12/19 1853  01/14/19 0434 01/15/19 0424 01/16/19 0430 01/17/19 0412 01/18/19 0411  WBC 10.4   < > 7.6 9.0 6.0 12.6* 17.2*  NEUTROABS 7.5  --  5.3 6.4 3.4  --   --   HGB 14.3   < > 12.2 11.9* 12.0 12.0 11.8*  HCT 45.9   < > 40.5 39.2 39.1 38.8 39.2  MCV 89.3   < > 90.0 90.3 90.7 89.8 89.3  PLT 369   < > 337 315 298 337 367   < > = values in this interval not displayed.   Cardiac Enzymes: Recent Labs  Lab 01/12/19 1853  TROPONINI <0.03   BNP: BNP (last 3 results) Recent Labs    12/30/18 0301 01/12/19 1855  BNP 84.7 34.2    ProBNP (last 3 results) No results for input(s): PROBNP in the last 8760 hours.  CBG: No results for input(s): GLUCAP in the last 168 hours.     Signed:  Fayrene Helper MD.  [facility] 01/18/2019, 10:53 AM

## 2019-01-21 ENCOUNTER — Encounter: Payer: Self-pay | Admitting: Emergency Medicine

## 2019-01-21 ENCOUNTER — Ambulatory Visit (INDEPENDENT_AMBULATORY_CARE_PROVIDER_SITE_OTHER): Payer: BC Managed Care – PPO | Admitting: Emergency Medicine

## 2019-01-21 ENCOUNTER — Other Ambulatory Visit: Payer: Self-pay

## 2019-01-21 VITALS — BP 122/80 | HR 110 | Temp 98.4°F | Ht 63.5 in | Wt 218.4 lb

## 2019-01-21 DIAGNOSIS — R059 Cough, unspecified: Secondary | ICD-10-CM | POA: Insufficient documentation

## 2019-01-21 DIAGNOSIS — R05 Cough: Secondary | ICD-10-CM

## 2019-01-21 DIAGNOSIS — D869 Sarcoidosis, unspecified: Secondary | ICD-10-CM | POA: Diagnosis not present

## 2019-01-21 MED ORDER — OMEPRAZOLE 40 MG PO CPDR: 40.0000 mg | DELAYED_RELEASE_CAPSULE | Freq: Two times a day (BID) | ORAL | 5 refills | Status: DC

## 2019-01-21 MED ORDER — PREDNISONE 20 MG PO TABS: 20.0000 mg | ORAL_TABLET | Freq: Every day | ORAL | 1 refills | Status: DC

## 2019-01-21 NOTE — Assessment & Plan Note (Signed)
Some continued cough even on the prednisone.  She does have some breakthrough GERD symptoms.  I think we should increase her omeprazole to twice a day temporarily to see if we can obtain better control particularly since the prednisone will exacerbate her reflux.

## 2019-01-21 NOTE — Progress Notes (Signed)
Subjective:    Patient ID: Anna Lucero, female    DOB: May 21, 1976, 43 y.o.   MRN: [id number]  HPI 43 year old never smoker with a history of obesity, GERD, rheumatoid arthritis on methotrexate since 6 months ago, PCOS, depression, borderline diabetes.  She was admitted in late May with dyspnea, cough, some wheeze, found to have an abnormal CT scan of the chest with bulky mediastinal lymphadenopathy as well as a cervical node.  This was biopsied by interventional radiology and was consistent with granulomatous lymphadenitis, most consistent with sarcoidosis, no evidence for lymphoma.  She was started on Qvar a few months ago, but never took consistently. Sent from the hospital and given a prednisone taper.  She was readmitted for dyspnea, discharged 6/19, for possible healthcare associated pneumonia.  She improved with antibiotics but also corticosteroids.  Plan to taper to 20 mg daily and then hold at that dose until this follow-up. Currently on 30mg .   She describes improved breathing. Still with cough, better since the pred but still happens at night. Nebulized albuterol bid.   Has been on MTX for RA, follows with Dr Tamera Punt at [facility] out of it 2 weeks ago.    Review of Systems  Respiratory: Positive for cough and shortness of breath.   Cardiovascular:       Irregular Heartbeats  Gastrointestinal:       Acid Reflux  Musculoskeletal: Positive for arthralgias.  Psychiatric/Behavioral: The patient is nervous/anxious.     Past Medical History:  Diagnosis Date  . Anemia   . Anxiety   . Bronchitis   . Depression   . GERD (gastroesophageal reflux disease)   . Gestational diabetes   . Iritis   . PCOS (polycystic ovarian syndrome)   . RA (rheumatoid arthritis) (HCC)      Family History  Problem Relation Age of Onset  . Alcohol abuse Mother   . Drug abuse Mother   . Bipolar disorder Mother   . Anxiety disorder Mother   . Depression Mother   . Alcohol abuse Father   .  Drug abuse Father   . Anxiety disorder Sister   . Depression Sister   . Breast cancer Neg Hx      Social History   Socioeconomic History  . Marital status: Married    Spouse name: Not on file  . Number of children: 3  . Years of education: Not on file  . Highest education level: Some college, no degree  Occupational History    Comment: full time  Social Needs  . Financial resource strain: Not hard at all  . Food insecurity    Worry: Never true    Inability: Never true  . Transportation needs    Medical: No    Non-medical: No  Tobacco Use  . Smoking status: Never Smoker  . Smokeless tobacco: Never Used  Substance and Sexual Activity  . Alcohol use: Yes    Alcohol/week: 3.0 standard drinks    Types: 3 Glasses of wine per week    Comment: 3x a week  . Drug use: No  . Sexual activity: Yes    Birth control/protection: I.U.D.  Lifestyle  . Physical activity    Days per week: 0 days    Minutes per session: 0 min  . Stress: Very much  Relationships  . Social Herbalist on phone: Not on file    Gets together: Not on file    Attends religious service: More than  4 times per year    Active member of club or organization: No    Attends meetings of clubs or organizations: Never    Relationship status: Married  . Intimate partner violence    Fear of current or ex partner: No    Emotionally abused: No    Physically abused: No    Forced sexual activity: No  Other Topics Concern  . Not on file  Social History Narrative  . 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   Allergies  Allergen Reactions  . 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 Celebrex [Celecoxib] Other (See Comments)    Chest pain  . Spironolactone Other (See Comments)    Bleeding     Outpatient Medications Prior to Visit  Medication Sig Dispense Refill  . acetaminophen (TYLENOL) 500 MG tablet Take 2 tablets (1,000 mg total) by mouth every 8  (eight) hours as needed for moderate pain. 30 tablet 0  . albuterol (PROVENTIL) (2.5 MG/3ML) 0.083% nebulizer solution Take 3 mLs by nebulization every 6 (six) hours as needed for wheezing or shortness of breath.    . Albuterol Sulfate (PROAIR RESPICLICK) 297 (90 Base) MCG/ACT AEPB Inhale 2 puffs into the lungs every 6 (six) hours as needed (shortness of breath).     Marland Kitchen amLODipine (NORVASC) 5 MG tablet Take 1 tablet (5 mg total) by mouth daily. 30 tablet 0  . ARIPiprazole (ABILIFY) 5 MG tablet Take 1 tablet (5 mg total) by mouth daily. 90 tablet 0  . doxepin (SINEQUAN) 25 MG capsule Take 1 capsule (25 mg total) by mouth at bedtime as needed (insomnia). 90 capsule 0  . folic acid (FOLVITE) 1 MG tablet Take 1 tablet (1 mg total) by mouth daily for 30 days. 30 tablet 0  . HYDROcodone-homatropine (HYCODAN) 5-1.5 MG/5ML syrup Take 5 mLs by mouth every 6 (six) hours as needed for up to 3 days for cough. 120 mL 0  . inFLIXimab (REMICADE IV) Inject into the vein every 6 (six) weeks.     Marland Kitchen omeprazole (PRILOSEC) 40 MG capsule Take 40 mg by mouth daily.    . predniSONE (DELTASONE) 20 MG tablet Take 2 tablets (40 mg total) by mouth daily with breakfast for 3 days, THEN 1.5 tablets (30 mg total) daily with breakfast for 3 days, THEN 1 tablet (20 mg total) daily with breakfast for 30 days. Follow up with pulmonology for further instructions regarding your steroids. 40.5 tablet 0  . beclomethasone (QVAR) 40 MCG/ACT inhaler Inhale 1 puff into the lungs 2 (two) times daily.    . diclofenac (VOLTAREN) 75 MG EC tablet Take 150 mg by mouth 2 (two) times a day.    . methotrexate (RHEUMATREX) 2.5 MG tablet Take 20 mg by mouth every Saturday.     No facility-administered medications prior to visit.         Objective:   Physical Exam  Vitals:   01/21/19 0946  BP: 122/80  Pulse: (!) 110  Temp: 98.4 F (36.9 C)  TempSrc: Oral  SpO2: 96%  Weight: 218 lb 6.4 oz (99.1 kg)  Height: 5' 3.5" (1.613 m)   Gen:  Pleasant, overwt woman, in no distress,  normal affect  ENT: No lesions,  mouth clear,  oropharynx clear, no postnasal drip  Neck: No JVD, no stridor, R cervical node present,  difficult to palpate  Lungs: No use of accessory muscles, no  crackles or wheezing on normal respiration, no wheeze on forced expiration  Cardiovascular: RRR, heart sounds normal, no murmur or gallops, no peripheral edema  Musculoskeletal: No deformities, no cyanosis or clubbing  Neuro: alert, awake, non focal  Skin: Warm, tattoo on R forearm examined, has some papular dark raised rash tracking the ink, beginning to dry / clear      Assessment & Plan:  Sarcoid Evaluation, cervical node biopsy consistent with sarcoidosis.  She has been treated with corticosteroids since her last admission, currently on 30 mg daily, has had some clinical improvement in breathing, the size of her cervical node, some rash that is associated with her tattoos that looks characteristic.  I think she needs 4 more weeks of therapy and then we can taper to 0 to see if we achieve remission.  Interestingly she had been on methotrexate for rheumatoid arthritis which was stopped not long ago.  Question whether she was actually being treated for the sarcoidosis as well.  I wonder whether it may have flared because the methotrexate was discontinued.  Unclear as to whether she will need maintenance therapy, but they have clearly committed to maintenance methotrexate for the rheumatoid disease.  This may actually be in her favor with regard to suppression of the sarcoidosis.  We will of course follow to ensure no evidence of evolving parenchymal disease on the methotrexate.  I will need to review this with rheumatology going forward.   Please continue your prednisone taper down to 20 mg daily and then continue this dose for the next 4 weeks.  At that time we will rapidly taper with a plan to get down to 0. Continue using albuterol nebulizer twice a day  for now.  We will modify your inhaler regimen after this flareup. We will perform pulmonary function testing to assess your baseline after prednisone treatment We will follow your CT scan of the chest, plan the next film only follow-up at your next visit You will need an annual ophthalmology examination Flu shot annually Continue to follow with Dr. Tamera Punt with rheumatology.  We will discuss plans with him to determine whether you are going to be on methotrexate going forward.  This may in fact help with maintenance of sarcoidosis. Follow with Dr Lamonte Sakai in 1 month or next available   Cough Some continued cough even on the prednisone.  She does have some breakthrough GERD symptoms.  I think we should increase her omeprazole to twice a day temporarily to see if we can obtain better control particularly since the prednisone will exacerbate her reflux.   Baltazar Apo, MD, PhD 01/21/2019, 12:29 PM [facility] [phone] or if no answer [phone]

## 2019-01-21 NOTE — Patient Instructions (Addendum)
Please continue your prednisone taper down to 20 mg daily and then continue this dose for the next 4 weeks.  At that time we will rapidly taper with a plan to get down to 0. Continue using albuterol nebulizer twice a day for now.  We will modify your inhaler regimen after this flareup. We will perform pulmonary function testing to assess your baseline after prednisone treatment We will follow your CT scan of the chest, plan the next film only follow-up at your next visit You will need an annual ophthalmology examination Flu shot annually Continue to follow with Dr. Tamera Punt with rheumatology.  We will discuss plans with him to determine whether you are going to be on methotrexate going forward.  This may in fact help with maintenance of sarcoidosis. Please increase your omeprazole to 40 mg twice a day until next visit.  Take this medication 1 hour around food. Follow with Dr Lamonte Sakai in 1 month or next available.

## 2019-01-21 NOTE — Assessment & Plan Note (Signed)
Evaluation, cervical node biopsy consistent with sarcoidosis.  She has been treated with corticosteroids since her last admission, currently on 30 mg daily, has had some clinical improvement in breathing, the size of her cervical node, some rash that is associated with her tattoos that looks characteristic.  I think she needs 4 more weeks of therapy and then we can taper to 0 to see if we achieve remission.  Interestingly she had been on methotrexate for rheumatoid arthritis which was stopped not long ago.  Question whether she was actually being treated for the sarcoidosis as well.  I wonder whether it may have flared because the methotrexate was discontinued.  Unclear as to whether she will need maintenance therapy, but they have clearly committed to maintenance methotrexate for the rheumatoid disease.  This may actually be in her favor with regard to suppression of the sarcoidosis.  We will of course follow to ensure no evidence of evolving parenchymal disease on the methotrexate.  I will need to review this with rheumatology going forward.   Please continue your prednisone taper down to 20 mg daily and then continue this dose for the next 4 weeks.  At that time we will rapidly taper with a plan to get down to 0. Continue using albuterol nebulizer twice a day for now.  We will modify your inhaler regimen after this flareup. We will perform pulmonary function testing to assess your baseline after prednisone treatment We will follow your CT scan of the chest, plan the next film only follow-up at your next visit You will need an annual ophthalmology examination Flu shot annually Continue to follow with Dr. Tamera Punt with rheumatology.  We will discuss plans with him to determine whether you are going to be on methotrexate going forward.  This may in fact help with maintenance of sarcoidosis. Follow with Dr Lamonte Sakai in 1 month or next available

## 2019-01-28 ENCOUNTER — Ambulatory Visit (HOSPITAL_COMMUNITY): Payer: BLUE CROSS/BLUE SHIELD | Admitting: Psychiatry

## 2019-01-28 ENCOUNTER — Other Ambulatory Visit: Payer: Self-pay

## 2019-02-18 ENCOUNTER — Ambulatory Visit: Payer: BC Managed Care – PPO | Admitting: Acute Care

## 2019-02-18 ENCOUNTER — Encounter: Payer: Self-pay | Admitting: Acute Care

## 2019-02-18 ENCOUNTER — Other Ambulatory Visit: Payer: Self-pay

## 2019-02-18 VITALS — BP 122/76 | HR 108 | Temp 98.1°F | Ht 63.0 in | Wt 221.4 lb

## 2019-02-18 DIAGNOSIS — K2100 Gastro-esophageal reflux disease with esophagitis, without bleeding: Secondary | ICD-10-CM

## 2019-02-18 DIAGNOSIS — Z7952 Long term (current) use of systemic steroids: Secondary | ICD-10-CM

## 2019-02-18 DIAGNOSIS — R059 Cough, unspecified: Secondary | ICD-10-CM

## 2019-02-18 DIAGNOSIS — M05771 Rheumatoid arthritis with rheumatoid factor of right ankle and foot without organ or systems involvement: Secondary | ICD-10-CM | POA: Diagnosis not present

## 2019-02-18 DIAGNOSIS — D869 Sarcoidosis, unspecified: Secondary | ICD-10-CM | POA: Diagnosis not present

## 2019-02-18 DIAGNOSIS — R05 Cough: Secondary | ICD-10-CM

## 2019-02-18 MED ORDER — MOMETASONE FURO-FORMOTEROL FUM 100-5 MCG/ACT IN AERO: 2.0000 | INHALATION_SPRAY | Freq: Two times a day (BID) | RESPIRATORY_TRACT | 3 refills | Status: DC

## 2019-02-18 NOTE — Assessment & Plan Note (Signed)
Increase in omeprazole to 40 mg BID has helped GERD Plan We will continue current dosing regimen We will send for GI consult to optimize GERD therapy We will provide with a copy of the GERD diet today.

## 2019-02-18 NOTE — Assessment & Plan Note (Signed)
Chronic Prednisone therapy with frequent bursts and tapers for RA and Sarcoid. Plan Will schedule Dexa scan to evaluate for osteoporiasis We will call with results

## 2019-02-18 NOTE — Assessment & Plan Note (Addendum)
Resolving flare Currently on prednisone 20 mg daily Plan We will start weaning your prednisone.  Start 10 mg prednisone daily for 7 days. Then decrease to 5 mg daily for 7 days then stop If at any point in time you start to flare please call us right away.  Continue your Methotrexate 20 mg every Saturday as you have been doing.  Follow up in 1 month with PFT's with Dr. Lamonte Sakai or Judson Roch NP ( as you will be off prednisone). We  will evaluate timing of repeat CT Chest at that visit.  Work on weight loss once you are off the prednisone. . We will place an order for a bone density scan as you have had ongoing  prednisone  therapy over many years. This will evaluate for osteoporesis. Please contact office for sooner follow up if symptoms do not improve or worsen or seek emergency care

## 2019-02-18 NOTE — Progress Notes (Signed)
History of Present Illness Anna Lucero is a 43 y.o. female with sarcoidosis, bronchitis, GERD and rheumatoid arthritis. She is followed by Dr. Lamonte Sakai  Synopsis: Hospitalized 01/12/2019- 01/18/2019 for shortness of breath . CT was suspicious for sarcoid or lymphoma. She had cervical node biopsy 12/31/2018  consistent with sarcoid. She was treated with IV steroids then discharged on 20 mg prednisone daily on 01/18/2019. She was seen by Dr. Lamonte Sakai 6/22, prednisone 20 mg daily was continued.Omeprazole was increased to 40 mg BID.      02/18/2019  Pt. Presents for a 4 week follow up. She was seen by Dr. Lamonte Sakai for follow up on prednisone therapy . She was placed on prednisone 20 mg daily at discharge on 01/18/2019. Dr. Lamonte Sakai saw her a hospital follow up on 01/21/2019 and felt she needed an additional 4 weeks to prednisone therapy. She presents today stating she is feeling much better. She states she has been compliant with her prednisone 20 mg daily.She states she has been doing well. She states her shortness of breath is better. She has been compliant with her Methotrexate weekly. She states she has been wheezing for the last several days at night. She feels this is due to her allergies. She has not been taking her Allegra every day, but states she will make it a daily medication.She would like to try [facility] again to see if this may help. She was on [facility] prior to hospitalization in June. Dr. Lamonte Sakai felt wheezing  was due to GERD. He increased her Omeprazole to 40 mg BID x 1 month. She states she was compliant with this, and she felt her GERD has been better controlled in the BID dosing.She has not been following the GERD diet. She has some concerns with prednisone and weight gain.She is anxious to wean off. She denies fever, chest pain, orthopnea or hemoptysis.  Test Results: 12/31/2018>> Cervical node biopsy consistent with  sarcoid  CBC Latest Ref Rng & Units 01/18/2019 01/17/2019 01/16/2019  WBC 4.0 - 10.5 K/uL  17.2(H) 12.6(H) 6.0  Hemoglobin 12.0 - 15.0 g/dL 11.8(L) 12.0 12.0  Hematocrit 36.0 - 46.0 % 39.2 38.8 39.1  Platelets 150 - 400 K/uL 367 337 298    BMP Latest Ref Rng & Units 01/18/2019 01/17/2019 01/16/2019  Glucose 70 - 99 mg/dL 127(H) 153(H) 93  BUN 6 - 20 mg/dL 15 15 10   Creatinine 0.44 - 1.00 mg/dL 0.59 0.60 0.69  Sodium 135 - 145 mmol/L 136 136 138  Potassium 3.5 - 5.1 mmol/L 4.0 4.3 3.4(L)  Chloride 98 - 111 mmol/L 105 105 106  CO2 22 - 32 mmol/L 22 23 22   Calcium 8.9 - 10.3 mg/dL 9.0 9.2 8.7(L)    BNP    Component Value Date/Time   BNP 34.2 01/12/2019 1855    ProBNP No results found for: PROBNP  PFT No results found for: FEV1PRE, FEV1POST, FVCPRE, FVCPOST, TLC, DLCOUNC, PREFEV1FVCRT, PSTFEV1FVCRT  No results found.   Past medical hx Past Medical History:  Diagnosis Date  . Anemia   . Anxiety   . Bronchitis   . Depression   . GERD (gastroesophageal reflux disease)   . Gestational diabetes   . Iritis   . PCOS (polycystic ovarian syndrome)   . RA (rheumatoid arthritis) (HCC)      Social History   Tobacco Use  . Smoking status: Never Smoker  . Smokeless tobacco: Never Used  Substance Use Topics  . Alcohol use: Yes    Alcohol/week: 3.0 standard drinks  Types: 3 Glasses of wine per week    Comment: 3x a week  . Drug use: No    Anna Lucero reports that she has never smoked. She has never used smokeless tobacco. She reports current alcohol use of about 3.0 standard drinks of alcohol per week. She reports that she does not use drugs.  Tobacco Cessation: Never smoker  Past surgical hx, Family hx, Social hx all reviewed.  Current Outpatient Medications on File Prior to Visit  Medication Sig  . acetaminophen (TYLENOL) 500 MG tablet Take 2 tablets (1,000 mg total) by mouth every 8 (eight) hours as needed for moderate pain. (Patient not taking: Reported on 02/18/2019)  . albuterol (PROVENTIL) (2.5 MG/3ML) 0.083% nebulizer solution Take 3 mLs by nebulization  every 6 (six) hours as needed for wheezing or shortness of breath.  . Albuterol Sulfate (PROAIR RESPICLICK) 595 (90 Base) MCG/ACT AEPB Inhale 2 puffs into the lungs every 6 (six) hours as needed (shortness of breath).   Marland Kitchen amLODipine (NORVASC) 5 MG tablet Take 1 tablet (5 mg total) by mouth daily. (Patient not taking: Reported on 02/18/2019)  . ARIPiprazole (ABILIFY) 5 MG tablet Take 1 tablet (5 mg total) by mouth daily. (Patient not taking: Reported on 02/18/2019)  . diclofenac (VOLTAREN) 75 MG EC tablet Take 150 mg by mouth 2 (two) times a day.  Marland Kitchen doxepin (SINEQUAN) 25 MG capsule Take 1 capsule (25 mg total) by mouth at bedtime as needed (insomnia). (Patient not taking: Reported on 02/18/2019)  . inFLIXimab (REMICADE IV) Inject into the vein every 6 (six) weeks.   . methotrexate (RHEUMATREX) 2.5 MG tablet Take 20 mg by mouth every Saturday.  Marland Kitchen omeprazole (PRILOSEC) 40 MG capsule Take 1 capsule (40 mg total) by mouth 2 (two) times a day. (Patient not taking: Reported on 02/18/2019)  . predniSONE (DELTASONE) 20 MG tablet Take 1 tablet (20 mg total) by mouth daily with breakfast. (Patient not taking: Reported on 02/18/2019)   No current facility-administered medications on file prior to visit.      Allergies  Allergen Reactions  . 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 Celebrex [Celecoxib] Other (See Comments)    Chest pain  . Spironolactone Other (See Comments)    Bleeding    Review Of Systems:  Constitutional:   No  weight loss, night sweats,  Fevers, chills, fatigue, or  lassitude.  HEENT:   No headaches,  Difficulty swallowing,  Tooth/dental problems, or  Sore throat,                No sneezing, itching, ear ache, nasal congestion, post nasal drip, CV:  No chest pain,  Orthopnea, PND, swelling in lower extremities, anasarca, dizziness, palpitations, syncope.   GI  No heartburn, indigestion, abdominal pain, nausea, vomiting, diarrhea, change in bowel  habits, loss of appetite, bloody stools.   Resp: No shortness of breath with exertion or at rest.  No excess mucus, no productive cough,  No non-productive cough,  No coughing up of blood.  No change in color of mucus.  + occasional  Wheezing at bedtime.  No chest wall deformity  Skin: no rash or lesions.Several tattoos  GU: no dysuria, change in color of urine, no urgency or frequency.  No flank pain, no hematuria   MS:  No joint pain or swelling.  No decreased range of motion.  No back pain.  Psych:  No change in mood or affect. No depression or anxiety.  No memory loss.   Vital Signs BP 122/76 (BP Location: Left Arm, Cuff Size: Large)   Pulse (!) 108   Temp 98.1 F (36.7 C) (Oral)   Ht 5\' 3"  (1.6 m)   Wt 221 lb 6.4 oz (100.4 kg)   SpO2 97%   BMI 39.22 kg/m    Physical Exam:  General- No distress,  A&Ox3, pleasant  ENT: No sinus tenderness, TM clear, pale nasal mucosa, no oral exudate,no post nasal drip, no LAN, cervical node is no longer enlarged Cardiac: S1, S2, regular rate and rhythm, no murmur Chest: No wheeze/ rales/ dullness; no accessory muscle use, no nasal flaring, no sternal retractions Abd.: Soft Non-tender, ND, BS +, Body mass index is 39.22 kg/m. Ext: No clubbing cyanosis, edema Neuro:  normal strength, MAE x 4, A&O x 3, appropriate Skin: No rashes, warm and dry, tattoos Psych: normal mood and behavior   Assessment/Plan  Sarcoid Resolving flare Currently on prednisone 20 mg daily Plan We will start weaning your prednisone.  Start 10 mg prednisone daily for 7 days. Then decrease to 5 mg daily for 7 days then stop If at any point in time you start to flare please call us right away.  Continue your Methotrexate 20 mg every Saturday as you have been doing.  Follow up in 1 month with PFT's with Dr. Lamonte Sakai or Judson Roch NP ( as you will be off prednisone). We  will evaluate timing of repeat CT Chest at that visit.  Work on weight loss once you are off the  prednisone. . We will place an order for a bone density scan as you have had ongoing  prednisone  therapy over many years. This will evaluate for osteoporesis. Please contact office for sooner follow up if symptoms do not improve or worsen or seek emergency care    Rheumatoid arthritis with positive rheumatoid factor (HCC) Stable interval Plan Continue Methotrexate 20 mg every Saturday Follow up with Dr. Rheumatoidology     Gastro-esophageal reflux disease with esophagitis Increase in omeprazole to 40 mg BID has helped GERD Plan We will continue current dosing regimen We will send for GI consult to optimize GERD therapy We will provide with a copy of the GERD diet today.   On prednisone therapy Chronic Prednisone therapy with frequent bursts and tapers for RA and Sarcoid. Plan Will schedule Dexa scan to evaluate for osteoporiasis We will call with results  Cough Improved to resolved on after prednisone therapy     Magdalen Spatz, NP 02/18/2019  12:11 PM

## 2019-02-18 NOTE — Assessment & Plan Note (Signed)
Stable interval Plan Continue Methotrexate 20 mg every Saturday Follow up with Dr. Rheumatoidology

## 2019-02-18 NOTE — Patient Instructions (Addendum)
It is good to see you today. I am so glad you are feeling better. We will start weaning your prednisone.  Start 10 mg prednisone daily for 7 days. Then decrease to 5 mg daily for 7 days then stop If at any point in time you start to flare please call us right away.  Continue your Methotrexate 20 mg every Saturday as you have been doing.  Continue Omeprazole twice daily We will Give you a copy of the GERD diet We will refer you to GI for evaluation and optimization of her reflux disease We will do a therapeutic trial of Dulera Use 2 puffs once daily Rinse mouth after use.  Use your rescue inhaler as needed for breakthrough shortness of breath or wheezing. Follow up in 1 month with PFT's ( as you will be off prednisone). We  will evaluate timing of repeat CT Chest at that visit.  Work on weight loss once you are off the prednisone. . We will place an order for a bone density scan as you have had ongoing  prednisone  therapy over many years. This will evaluate for osteoporesis. Please contact office for sooner follow up if symptoms do not improve or worsen or seek emergency care  Please let us know if you need [place] sooner.

## 2019-02-18 NOTE — Assessment & Plan Note (Signed)
Improved to resolved on after prednisone therapy

## 2019-03-11 ENCOUNTER — Ambulatory Visit (INDEPENDENT_AMBULATORY_CARE_PROVIDER_SITE_OTHER): Payer: BC Managed Care – PPO | Admitting: Psychiatry

## 2019-03-11 ENCOUNTER — Encounter (HOSPITAL_COMMUNITY): Payer: Self-pay | Admitting: Psychiatry

## 2019-03-11 ENCOUNTER — Other Ambulatory Visit: Payer: Self-pay

## 2019-03-11 DIAGNOSIS — F411 Generalized anxiety disorder: Secondary | ICD-10-CM

## 2019-03-11 DIAGNOSIS — F330 Major depressive disorder, recurrent, mild: Secondary | ICD-10-CM

## 2019-03-11 DIAGNOSIS — F4312 Post-traumatic stress disorder, chronic: Secondary | ICD-10-CM

## 2019-03-11 MED ORDER — DOXEPIN HCL 25 MG PO CAPS: 25.0000 mg | ORAL_CAPSULE | Freq: Every evening | ORAL | 0 refills | Status: DC | PRN

## 2019-03-11 MED ORDER — ARIPIPRAZOLE 5 MG PO TABS: 5.0000 mg | ORAL_TABLET | Freq: Every day | ORAL | 0 refills | Status: DC

## 2019-03-11 NOTE — Progress Notes (Signed)
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 Absolute 0.1 0.0 - 0.5 K/uL   Basophils Relative 0 %   Basophils Absolute 0.0 0.0 - 0.1 K/uL   Immature Granulocytes 0 %   Abs Immature Granulocytes 0.04 0.00 - 0.07 K/uL    Comment: Performed at [facility], [place] [address], [place], [place] [address]  Comprehensive metabolic panel     Status: Abnormal   Collection Time: 01/12/19  6:53 PM  Result Value Ref Range  Sodium 138 135 - 145 mmol/L   Potassium 3.7 3.5 - 5.1 mmol/L   Chloride 105 98 - 111 mmol/L   CO2 23 22 - 32 mmol/L   Glucose, Bld 101 (H) 70 - 99 mg/dL   BUN 11 6 - 20 mg/dL   Creatinine, Ser 0.72 0.44 - 1.00 mg/dL   Calcium 9.1 8.9 - 10.3 mg/dL   Total Protein 8.9 (H) 6.5 - 8.1 g/dL   Albumin 3.8 3.5 - 5.0 g/dL   AST 26 15 - 41 U/L   ALT 25 0 - 44 U/L   Alkaline Phosphatase 99 38 - 126 U/L   Total Bilirubin 0.2 (L) 0.3 - 1.2 mg/dL   GFR calc non Af Amer >60 >60 mL/min   GFR calc Af Amer >60 >60 mL/min   Anion gap 10 5 - 15    Comment: Performed at [facility], [place] [address], [place], [place] [address]  D-dimer, quantitative     Status: Abnormal   Collection Time: 01/12/19  6:53 PM  Result Value Ref Range   D-Dimer, Quant 0.85 (H) 0.00 - 0.50 ug/mL-FEU    Comment: (NOTE) At the manufacturer cut-off of 0.50 ug/mL FEU, this assay has been documented to exclude PE with a sensitivity and negative predictive value of 97 to 99%.  At this time, this assay has not been approved by the FDA to exclude DVT/VTE. Results should be correlated with clinical presentation. Performed at [facility], [place] [address] [place], [place] [address]   Procalcitonin     Status: None   Collection Time: 01/12/19  6:53 PM  Result Value Ref Range   Procalcitonin <0.10 ng/mL    Comment:        Interpretation: PCT (Procalcitonin) <= 0.5 ng/mL: Systemic infection  (sepsis) is not likely. Local bacterial infection is possible. (NOTE)       Sepsis PCT Algorithm           Lower Respiratory Tract                                      Infection PCT Algorithm    ----------------------------     ----------------------------         PCT < 0.25 ng/mL                PCT < 0.10 ng/mL         Strongly encourage             Strongly discourage   discontinuation of antibiotics    initiation of antibiotics    ----------------------------     -----------------------------       PCT 0.25 - 0.50 ng/mL            PCT 0.10 - 0.25 ng/mL               OR       >80% decrease in PCT            Discourage initiation of                                            antibiotics      Encourage discontinuation           of antibiotics    ----------------------------     -----------------------------  PCT >= 0.50 ng/mL              PCT 0.26 - 0.50 ng/mL               AND        <80% decrease in PCT             Encourage initiation of                                             antibiotics       Encourage continuation           of antibiotics    ----------------------------     -----------------------------        PCT >= 0.50 ng/mL                  PCT > 0.50 ng/mL               AND         increase in PCT                  Strongly encourage                                      initiation of antibiotics    Strongly encourage escalation           of antibiotics                                     -----------------------------                                           PCT <= 0.25 ng/mL                                                 OR                                        > 80% decrease in PCT                                     Discontinue / Do not initiate                                             antibiotics Performed at [place] [address] [place], [place] [address]   Lactate dehydrogenase     Status: Abnormal   Collection Time:  01/12/19  6:53 PM  Result Value Ref Range   LDH 213 (H) 98 - 192 U/L    Comment: Performed at [facility], [place] [address], [place], [place] [address]  Ferritin  Status: None   Collection Time: 01/12/19  6:53 PM  Result Value Ref Range   Ferritin 40 11 - 307 ng/mL    Comment: Performed at [facility], [place] [address] [place], [place] [address]  Triglycerides     Status: None   Collection Time: 01/12/19  6:53 PM  Result Value Ref Range   Triglycerides 119 <150 mg/dL    Comment: Performed at [facility], [place] [address], [place], [place] [address]  Fibrinogen     Status: Abnormal   Collection Time: 01/12/19  6:53 PM  Result Value Ref Range   Fibrinogen 562 (H) 210 - 475 mg/dL    Comment: Performed at [facility], [place] [address] [place], [place] [address]  C-reactive protein     Status: Abnormal   Collection Time: 01/12/19  6:53 PM  Result Value Ref Range   CRP 2.3 (H) <1.0 mg/dL    Comment: Performed at [facility], [place] [address] [place], [place] [address]  Troponin I - ONCE - STAT     Status: None   Collection Time: 01/12/19  6:53 PM  Result Value Ref Range   Troponin I <0.03 <0.03 ng/mL    Comment: Performed at [facility], [place] [address], [place], [place] [address]  Brain natriuretic peptide     Status: None   Collection Time: 01/12/19  6:55 PM  Result Value Ref Range   B Natriuretic Peptide 34.2 0.0 - 100.0 pg/mL    Comment: Performed at [facility], [place] [address], [place], [place] [address]  Blood culture (routine x 2)     Status: None   Collection Time: 01/12/19  6:57 PM   Specimen: BLOOD  Result Value Ref Range   Specimen Description      BLOOD LEFT ANTECUBITAL Performed at [facility], [place] [address] [place], [place] [address]    Special Requests      BOTTLES DRAWN AEROBIC AND ANAEROBIC Blood Culture adequate  volume Performed at [place] [address], [place], [place] [address]    Culture      NO GROWTH 5 DAYS Performed at [facility], [place] [address], [place], [place] [address]    Report Status 01/18/2019 FINAL   hCG, quantitative, pregnancy     Status: None   Collection Time: 01/12/19  6:57 PM  Result Value Ref Range   hCG, Beta Chain, Quant, S <1 <5 mIU/mL    Comment:          GEST. AGE      CONC.  (mIU/mL)   <=1 WEEK        5 - 50     2 WEEKS       50 - 500     3 WEEKS       100 - 10,000     4 WEEKS     1,000 - 30,000     5 WEEKS     3,500 - 115,000   6-8 WEEKS     12,000 - 270,000    12 WEEKS     15,000 - 220,000        FEMALE AND NON-PREGNANT FEMALE:     LESS THAN 5 mIU/mL Performed at [facility], [place] [address], [place],  [address]   Culture, sputum-assessment     Status: None   Collection Time: 01/13/19  3:59 AM   Specimen:  Sputum  Result Value Ref Range   Specimen Description SPUTUM    Special Requests Immunocompromised    Sputum evaluation      THIS SPECIMEN IS ACCEPTABLE FOR SPUTUM CULTURE Performed at [place] [address], [place], [place] [address]    Report Status 01/14/2019 FINAL   Culture, respiratory     Status: None   Collection Time: 01/13/19  3:59 AM   Specimen: SPU  Result Value Ref Range   Specimen Description      SPUTUM Performed at [place] [address] [place], [place] [address]    Special Requests      Immunocompromised Reflexed from [phone] Performed at [facility], [place] [address], [place], [place] [address]    Gram Stain      ABUNDANT WBC PRESENT,BOTH PMN AND MONONUCLEAR RARE GRAM POSITIVE COCCI    Culture      FEW Consistent with normal respiratory flora. Performed at [facility], [place] [address], [place], [place] [address]    Report Status 01/16/2019 FINAL   Lactic acid, plasma     Status: None    Collection Time: 01/13/19  5:04 AM  Result Value Ref Range   Lactic Acid, Venous 1.2 0.5 - 1.9 mmol/L    Comment: Performed at [facility], [place] [address] [place], [place] [address]  Magnesium     Status: Abnormal   Collection Time: 01/13/19  5:04 AM  Result Value Ref Range   Magnesium 1.6 (L) 1.7 - 2.4 mg/dL    Comment: Performed at [facility], [place] [address], [place], [place] [address]  Phosphorus     Status: None   Collection Time: 01/13/19  5:04 AM  Result Value Ref Range   Phosphorus 3.0 2.5 - 4.6 mg/dL    Comment: Performed at [facility], [place] [address] [place], [place] [address]  CBC     Status: Abnormal   Collection Time: 01/13/19  5:04 AM  Result Value Ref Range   WBC 8.9 4.0 - 10.5 K/uL   RBC 4.60 3.87 - 5.11 MIL/uL   Hemoglobin 12.5 12.0 - 15.0 g/dL   HCT 41.9 36.0 - 46.0 %   MCV 91.1 80.0 - 100.0 fL   MCH 27.2 26.0 - 34.0 pg   MCHC 29.8 (L) 30.0 - 36.0 g/dL   RDW 13.2 11.5 - 15.5 %   Platelets 309 150 - 400 K/uL   nRBC 0.0 0.0 - 0.2 %    Comment: Performed at [facility], [place] [address] [place], [place] [address]  Comprehensive metabolic panel     Status: Abnormal   Collection Time: 01/13/19  5:04 AM  Result Value Ref Range   Sodium 138 135 - 145 mmol/L   Potassium 3.6 3.5 - 5.1 mmol/L   Chloride 111 98 - 111 mmol/L   CO2 22 22 - 32 mmol/L   Glucose, Bld 114 (H) 70 - 99 mg/dL   BUN 8 6 - 20 mg/dL   Creatinine, Ser 0.70 0.44 - 1.00 mg/dL   Calcium 8.4 (L) 8.9 - 10.3 mg/dL   Total Protein 7.1 6.5 - 8.1 g/dL   Albumin 3.0 (L) 3.5 - 5.0 g/dL   AST 18 15 - 41 U/L   ALT 20 0 - 44 U/L   Alkaline Phosphatase 75 38 - 126 U/L   Total Bilirubin 0.3 0.3 - 1.2 mg/dL   GFR calc non Af Amer >  60 >60 mL/min   GFR calc Af Amer >60 >60 mL/min   Anion gap 5 5 - 15    Comment: Performed at [facility], [place] [address], [place], [place] [address]  Respiratory Panel by PCR      Status: None   Collection Time: 01/13/19 12:24 PM   Specimen: Nasopharyngeal Swab; Respiratory  Result Value Ref Range   Adenovirus NOT DETECTED NOT DETECTED   Coronavirus 229E NOT DETECTED NOT DETECTED    Comment: (NOTE) The Coronavirus on the Respiratory Panel, DOES NOT test for the novel  Coronavirus (2019 nCoV)    Coronavirus HKU1 NOT DETECTED NOT DETECTED   Coronavirus NL63 NOT DETECTED NOT DETECTED   Coronavirus OC43 NOT DETECTED NOT DETECTED   Metapneumovirus NOT DETECTED NOT DETECTED   Rhinovirus / Enterovirus NOT DETECTED NOT DETECTED   Influenza A NOT DETECTED NOT DETECTED   Influenza B NOT DETECTED NOT DETECTED   Parainfluenza Virus 1 NOT DETECTED NOT DETECTED   Parainfluenza Virus 2 NOT DETECTED NOT DETECTED   Parainfluenza Virus 3 NOT DETECTED NOT DETECTED   Parainfluenza Virus 4 NOT DETECTED NOT DETECTED   Respiratory Syncytial Virus NOT DETECTED NOT DETECTED   Bordetella pertussis NOT DETECTED NOT DETECTED   Chlamydophila pneumoniae NOT DETECTED NOT DETECTED   Mycoplasma pneumoniae NOT DETECTED NOT DETECTED    Comment: Performed at [facility], [place] [address], [place], [place] [address]  C-reactive protein     Status: Abnormal   Collection Time: 01/14/19  4:34 AM  Result Value Ref Range   CRP 5.5 (H) <1.0 mg/dL    Comment: Performed at [facility], [place] [address], [place], [place] [address]  Magnesium     Status: None   Collection Time: 01/14/19  4:34 AM  Result Value Ref Range   Magnesium 2.3 1.7 - 2.4 mg/dL    Comment: Performed at [facility], [place] [address] [place], [place] [address]  CBC with Differential/Platelet     Status: None   Collection Time: 01/14/19  4:34 AM  Result Value Ref Range   WBC 7.6 4.0 - 10.5 K/uL   RBC 4.50 3.87 - 5.11 MIL/uL   Hemoglobin 12.2 12.0 - 15.0 g/dL   HCT 40.5 36.0 - 46.0 %   MCV 90.0 80.0 - 100.0 fL   MCH 27.1 26.0 - 34.0 pg   MCHC 30.1 30.0 - 36.0 g/dL   RDW 13.2 11.5 -  15.5 %   Platelets 337 150 - 400 K/uL   nRBC 0.0 0.0 - 0.2 %   Neutrophils Relative % 69 %   Neutro Abs 5.3 1.7 - 7.7 K/uL   Lymphocytes Relative 19 %   Lymphs Abs 1.5 0.7 - 4.0 K/uL   Monocytes Relative 10 %   Monocytes Absolute 0.7 0.1 - 1.0 K/uL   Eosinophils Relative 1 %   Eosinophils Absolute 0.0 0.0 - 0.5 K/uL   Basophils Relative 0 %   Basophils Absolute 0.0 0.0 - 0.1 K/uL   Immature Granulocytes 1 %   Abs Immature Granulocytes 0.04 0.00 - 0.07 K/uL    Comment: Performed at [facility], [place] [address], [place], [place] [address]  Basic metabolic panel     Status: Abnormal   Collection Time: 01/14/19  4:34 AM  Result Value Ref Range   Sodium 137 135 - 145 mmol/L   Potassium 4.3 3.5 - 5.1 mmol/L   Chloride 107 98 - 111 mmol/L  CO2 24 22 - 32 mmol/L   Glucose, Bld 121 (H) 70 - 99 mg/dL   BUN 10 6 - 20 mg/dL   Creatinine, Ser 0.64 0.44 - 1.00 mg/dL   Calcium 8.9 8.9 - 10.3 mg/dL   GFR calc non Af Amer >60 >60 mL/min   GFR calc Af Amer >60 >60 mL/min   Anion gap 6 5 - 15    Comment: Performed at [facility], [place] [address] [place], [place] [address]  CBC with Differential/Platelet     Status: Abnormal   Collection Time: 01/15/19  4:24 AM  Result Value Ref Range   WBC 9.0 4.0 - 10.5 K/uL   RBC 4.34 3.87 - 5.11 MIL/uL   Hemoglobin 11.9 (L) 12.0 - 15.0 g/dL   HCT 39.2 36.0 - 46.0 %   MCV 90.3 80.0 - 100.0 fL   MCH 27.4 26.0 - 34.0 pg   MCHC 30.4 30.0 - 36.0 g/dL   RDW 13.2 11.5 - 15.5 %   Platelets 315 150 - 400 K/uL   nRBC 0.0 0.0 - 0.2 %   Neutrophils Relative % 71 %   Neutro Abs 6.4 1.7 - 7.7 K/uL   Lymphocytes Relative 17 %   Lymphs Abs 1.5 0.7 - 4.0 K/uL   Monocytes Relative 10 %   Monocytes Absolute 0.9 0.1 - 1.0 K/uL   Eosinophils Relative 2 %   Eosinophils Absolute 0.2 0.0 - 0.5 K/uL   Basophils Relative 0 %   Basophils Absolute 0.0 0.0 - 0.1 K/uL   Immature Granulocytes 0 %   Abs Immature Granulocytes 0.03 0.00 -  0.07 K/uL    Comment: Performed at [facility], [place] [address], [place], [place] [address]  Basic metabolic panel     Status: Abnormal   Collection Time: 01/15/19  4:24 AM  Result Value Ref Range   Sodium 139 135 - 145 mmol/L   Potassium 3.9 3.5 - 5.1 mmol/L   Chloride 107 98 - 111 mmol/L   CO2 24 22 - 32 mmol/L   Glucose, Bld 101 (H) 70 - 99 mg/dL   BUN 13 6 - 20 mg/dL   Creatinine, Ser 0.71 0.44 - 1.00 mg/dL   Calcium 9.0 8.9 - 10.3 mg/dL   GFR calc non Af Amer >60 >60 mL/min   GFR calc Af Amer >60 >60 mL/min   Anion gap 8 5 - 15    Comment: Performed at [facility], [place] [address] [place], [place] [address]  MRSA PCR Screening     Status: None   Collection Time: 01/15/19  8:54 AM   Specimen: Nasal Mucosa; Nasopharyngeal  Result Value Ref Range   MRSA by PCR NEGATIVE NEGATIVE    Comment:        The GeneXpert MRSA Assay (FDA approved for NASAL specimens only), is one component of a comprehensive MRSA colonization surveillance program. It is not intended to diagnose MRSA infection nor to guide or monitor treatment for MRSA infections. Performed at [facility], [place] [address] [place], [place] [address]   Basic metabolic panel     Status: Abnormal   Collection Time: 01/16/19  4:30 AM  Result Value Ref Range   Sodium 138 135 - 145 mmol/L   Potassium 3.4 (L) 3.5 - 5.1 mmol/L   Chloride 106 98 - 111 mmol/L   CO2 22 22 - 32 mmol/L   Glucose, Bld 93 70 - 99 mg/dL   BUN 10  6 - 20 mg/dL   Creatinine, Ser 0.69 0.44 - 1.00 mg/dL   Calcium 8.7 (L) 8.9 - 10.3 mg/dL   GFR calc non Af Amer >60 >60 mL/min   GFR calc Af Amer >60 >60 mL/min   Anion gap 10 5 - 15    Comment: Performed at [facility], [place] [address], [place], [place] [address]  CBC with Differential/Platelet     Status: None   Collection Time: 01/16/19  4:30 AM  Result Value Ref Range   WBC 6.0 4.0 - 10.5 K/uL   RBC 4.31 3.87 - 5.11  MIL/uL   Hemoglobin 12.0 12.0 - 15.0 g/dL   HCT 39.1 36.0 - 46.0 %   MCV 90.7 80.0 - 100.0 fL   MCH 27.8 26.0 - 34.0 pg   MCHC 30.7 30.0 - 36.0 g/dL   RDW 13.2 11.5 - 15.5 %   Platelets 298 150 - 400 K/uL   nRBC 0.0 0.0 - 0.2 %   Neutrophils Relative % 55 %   Neutro Abs 3.4 1.7 - 7.7 K/uL   Lymphocytes Relative 24 %   Lymphs Abs 1.4 0.7 - 4.0 K/uL   Monocytes Relative 13 %   Monocytes Absolute 0.8 0.1 - 1.0 K/uL   Eosinophils Relative 6 %   Eosinophils Absolute 0.3 0.0 - 0.5 K/uL   Basophils Relative 1 %   Basophils Absolute 0.0 0.0 - 0.1 K/uL   Immature Granulocytes 1 %   Abs Immature Granulocytes 0.03 0.00 - 0.07 K/uL    Comment: Performed at [facility], [place] [address], [place], [place] [address]  CBC     Status: Abnormal   Collection Time: 01/17/19  4:12 AM  Result Value Ref Range   WBC 12.6 (H) 4.0 - 10.5 K/uL   RBC 4.32 3.87 - 5.11 MIL/uL   Hemoglobin 12.0 12.0 - 15.0 g/dL   HCT 38.8 36.0 - 46.0 %   MCV 89.8 80.0 - 100.0 fL   MCH 27.8 26.0 - 34.0 pg   MCHC 30.9 30.0 - 36.0 g/dL   RDW 12.9 11.5 - 15.5 %   Platelets 337 150 - 400 K/uL   nRBC 0.0 0.0 - 0.2 %    Comment: Performed at [facility], [place] [address], [place], [place] [address]  Comprehensive metabolic panel     Status: Abnormal   Collection Time: 01/17/19  4:12 AM  Result Value Ref Range   Sodium 136 135 - 145 mmol/L   Potassium 4.3 3.5 - 5.1 mmol/L    Comment: DELTA CHECK NOTED   Chloride 105 98 - 111 mmol/L   CO2 23 22 - 32 mmol/L   Glucose, Bld 153 (H) 70 - 99 mg/dL   BUN 15 6 - 20 mg/dL   Creatinine, Ser 0.60 0.44 - 1.00 mg/dL   Calcium 9.2 8.9 - 10.3 mg/dL   Total Protein 7.4 6.5 - 8.1 g/dL   Albumin 2.8 (L) 3.5 - 5.0 g/dL   AST 17 15 - 41 U/L   ALT 22 0 - 44 U/L   Alkaline Phosphatase 69 38 - 126 U/L   Total Bilirubin 0.3 0.3 - 1.2 mg/dL   GFR calc non Af Amer >60 >60 mL/min   GFR calc Af Amer >60 >60 mL/min   Anion gap 8 5 - 15    Comment: Performed  at [facility], [place] [address], [place], [place] [address]  Magnesium     Status: None  Collection Time: 01/17/19  4:12 AM  Result Value Ref Range   Magnesium 2.1 1.7 - 2.4 mg/dL    Comment: Performed at [facility], [place] [address] [place], [place] [address]  CBC     Status: Abnormal   Collection Time: 01/18/19  4:11 AM  Result Value Ref Range   WBC 17.2 (H) 4.0 - 10.5 K/uL   RBC 4.39 3.87 - 5.11 MIL/uL   Hemoglobin 11.8 (L) 12.0 - 15.0 g/dL   HCT 39.2 36.0 - 46.0 %   MCV 89.3 80.0 - 100.0 fL   MCH 26.9 26.0 - 34.0 pg   MCHC 30.1 30.0 - 36.0 g/dL   RDW 12.9 11.5 - 15.5 %   Platelets 367 150 - 400 K/uL   nRBC 0.0 0.0 - 0.2 %    Comment: Performed at [facility], [place] [address] [place], [place] [address]  Comprehensive metabolic panel     Status: Abnormal   Collection Time: 01/18/19  4:11 AM  Result Value Ref Range   Sodium 136 135 - 145 mmol/L   Potassium 4.0 3.5 - 5.1 mmol/L   Chloride 105 98 - 111 mmol/L   CO2 22 22 - 32 mmol/L   Glucose, Bld 127 (H) 70 - 99 mg/dL   BUN 15 6 - 20 mg/dL   Creatinine, Ser 0.59 0.44 - 1.00 mg/dL   Calcium 9.0 8.9 - 10.3 mg/dL   Total Protein 7.3 6.5 - 8.1 g/dL   Albumin 2.9 (L) 3.5 - 5.0 g/dL   AST 20 15 - 41 U/L   ALT 24 0 - 44 U/L   Alkaline Phosphatase 70 38 - 126 U/L   Total Bilirubin 0.1 (L) 0.3 - 1.2 mg/dL   GFR calc non Af Amer >60 >60 mL/min   GFR calc Af Amer >60 >60 mL/min   Anion gap 9 5 - 15    Comment: Performed at [facility], [place] [address] [place], [place] [address]  Magnesium     Status: None   Collection Time: 01/18/19  4:11 AM  Result Value Ref Range   Magnesium 2.1 1.7 - 2.4 mg/dL    Comment: Performed at [facility], [place] [address] [place], [place] [address]    Psychiatric Specialty Exam: Physical Exam  ROS  unknown if currently breastfeeding.There is no height or weight on file to calculate BMI.  General  Appearance: NA  Eye Contact:  NA  Speech:  Clear and Coherent and Slow  Volume:  Normal  Mood:  Euthymic  Affect:  NA  Thought Process:  Goal Directed  Orientation:  Full (Time, Place, and Person)  Thought Content:  WDL and Logical  Suicidal Thoughts:  No  Homicidal Thoughts:  No  Memory:  Immediate;   Good Recent;   Good Remote;   Good  Judgement:  Fair  Insight:  Fair  Psychomotor Activity:  NA  Concentration:  Concentration: Fair and Attention Span: Good  Recall:  Good  Fund of Knowledge:  Good  Language:  Good  Akathisia:  No  Handed:  Right  AIMS (if indicated):     Assets:  Communication Skills [employer] [place] [employer]  ADL's:  Intact  Cognition:  WNL  Sleep:         Assessment and Plan: Posttraumatic stress disorder.  Major depressive disorder, recurrent.  Generalized anxiety disorder.  Discussed risk of noncompliance can cause relapse.  She agreed and  promised to keep appointment and medication.  We will discontinue Celexa since patient never restarted and doing fine on Abilify and doxepin.  She has no tremors shakes or any EPS.  Continue Abilify 5 mg daily and doxepin 25 mg at bedtime.  Recommended to call us back if she has any question or any concern.  I review her blood work results.  She is no longer taking steroids and antibiotic.  Her WBC count is high.  She is no longer taking Ambien.  Follow-up in 3 months.  Follow Up Instructions:    I discussed the assessment and treatment plan with the patient. The patient was provided an opportunity to ask questions and all were answered. The patient agreed with the plan and demonstrated an understanding of the instructions.   The patient was advised to call back or seek an in-person evaluation if the symptoms worsen or if the condition fails to improve as anticipated.  I provided 20 minutes of non-face-to-face time during this encounter.   Kathlee Nations, MD

## 2019-03-22 ENCOUNTER — Ambulatory Visit: Payer: Medicaid Other | Admitting: Gastroenterology

## 2019-03-27 ENCOUNTER — Ambulatory Visit: Payer: BC Managed Care – PPO | Admitting: Acute Care

## 2019-03-27 ENCOUNTER — Ambulatory Visit (INDEPENDENT_AMBULATORY_CARE_PROVIDER_SITE_OTHER): Payer: Medicaid Other

## 2019-03-27 ENCOUNTER — Telehealth: Payer: Self-pay

## 2019-03-27 ENCOUNTER — Other Ambulatory Visit: Payer: Self-pay

## 2019-03-27 ENCOUNTER — Encounter: Payer: Self-pay | Admitting: Acute Care

## 2019-03-27 VITALS — BP 132/100 | HR 114 | Temp 97.7°F | Ht 63.5 in | Wt 224.4 lb

## 2019-03-27 DIAGNOSIS — Z7952 Long term (current) use of systemic steroids: Secondary | ICD-10-CM | POA: Diagnosis not present

## 2019-03-27 DIAGNOSIS — D464 Refractory anemia, unspecified: Secondary | ICD-10-CM

## 2019-03-27 DIAGNOSIS — K2100 Gastro-esophageal reflux disease with esophagitis, without bleeding: Secondary | ICD-10-CM

## 2019-03-27 DIAGNOSIS — D869 Sarcoidosis, unspecified: Secondary | ICD-10-CM

## 2019-03-27 DIAGNOSIS — R059 Cough, unspecified: Secondary | ICD-10-CM

## 2019-03-27 DIAGNOSIS — R05 Cough: Secondary | ICD-10-CM

## 2019-03-27 MED ORDER — MOMETASONE FURO-FORMOTEROL FUM 100-5 MCG/ACT IN AERO: 2.0000 | INHALATION_SPRAY | Freq: Two times a day (BID) | RESPIRATORY_TRACT | 3 refills | Status: DC

## 2019-03-27 MED ORDER — MOMETASONE FURO-FORMOTEROL FUM 200-5 MCG/ACT IN AERO: 2.0000 | INHALATION_SPRAY | Freq: Two times a day (BID) | RESPIRATORY_TRACT | 3 refills | Status: DC

## 2019-03-27 MED ORDER — MOMETASONE FURO-FORMOTEROL FUM 200-5 MCG/ACT IN AERO: 2.0000 | INHALATION_SPRAY | Freq: Two times a day (BID) | RESPIRATORY_TRACT | 0 refills | Status: DC

## 2019-03-27 NOTE — Telephone Encounter (Signed)
Patrice do you have an idea on when she will be scheduled for PFT?

## 2019-03-27 NOTE — Progress Notes (Addendum)
History of Present Illness Anna Lucero is a 43 y.o. female with sarcoidosis, bronchitis, GERD and rheumatoid arthritis. She is followed by Dr. Lamonte Sakai  Synopsis: Hospitalized 01/12/2019- 01/18/2019 for shortness of breath . CT was suspicious for sarcoid or lymphoma. She had cervical node biopsy 12/31/2018  consistent with sarcoid. She was treated with IV steroids then discharged on 20 mg prednisone daily on 01/18/2019. She was seen by Dr. Lamonte Sakai 6/22, prednisone 20 mg daily was continued.Omeprazole was increased to 40 mg BID.  She was seen again on 02/18/2019 and we weaned prednisone.  To  10 mg prednisone daily for 7 days. Then decrease to 5 mg daily for 7 days then stop. The patient was asked to call the office right away if she started to re-flare with the weaning of her prednisone.She was told to continue her Methotrexate 20 mg every Saturday as she had been doing. We asked her to work on weight loss as she will be off her prednisone, we ordered PFT's and a Dexa Scan, and we now  need to plan for follow up imaging to re-evaluate her adenopathy after prednisone treatment. She is here for 1 month follow up to evaluate how she is managing off prednisone.   03/27/2019 One month follow up Pt presents for follow up. She states she was able to wean off her prednisone, but she did notice that she started to wheeze at night time after she got to the 5 mg dose. She did not call the office to let us know as we had requested. She states now that she is off prednisone all together she does also note daytime wheezing. She states she is compliant with her Dulera 100. She is not using her rescue inhaler. She is not using her rescue nebs. She is compliant with her omeprazole and her methotrexate as prescribed. She had follow up with rheumatology last week. They increased her Remicaid to 600 mg every 6 weeks. She states she has less cough at present. She may cough twice a night.No secretions. She denies any swelling in her  legs or upper extremities. She has gained 3 pounds since the last OV 1 month ago. She has not had her annual eye exam, and her Blood pressure is elevated today in the office.  Test Results: CXR 03/27/2019 Normal heart size. The bilateral hilar adenopathy appears decreased in size compared to prior. No new focal infiltrate. No pneumothorax or pleural effusion. No acute osseous abnormality in the visualized skeleton. Decreased bilateral hilar adenopathy. No new acute pulmonary process.  CTA 01/12/2019 No definite PE given the limitations described above. Persistent mediastinal adenopathy and scattered bilateral pulmonary opacities is again most consistent with sarcoidosis. The patient's adenopathy causes significant narrowing of the right upper lobe pulmonary artery as well as mild to moderate narrowing of the bilateral lower lobe bronchi.   12/31/2018>> Cervical node biopsy consistent with  sarcoid  CBC Latest Ref Rng & Units 01/18/2019 01/17/2019 01/16/2019  WBC 4.0 - 10.5 K/uL 17.2(H) 12.6(H) 6.0  Hemoglobin 12.0 - 15.0 g/dL 11.8(L) 12.0 12.0  Hematocrit 36.0 - 46.0 % 39.2 38.8 39.1  Platelets 150 - 400 K/uL 367 337 298    BMP Latest Ref Rng & Units 01/18/2019 01/17/2019 01/16/2019  Glucose 70 - 99 mg/dL 127(H) 153(H) 93  BUN 6 - 20 mg/dL 15 15 10   Creatinine 0.44 - 1.00 mg/dL 0.59 0.60 0.69  Sodium 135 - 145 mmol/L 136 136 138  Potassium 3.5 - 5.1 mmol/L 4.0 4.3 3.4(L)  Chloride 98 - 111 mmol/L 105 105 106  CO2 22 - 32 mmol/L 22 23 22   Calcium 8.9 - 10.3 mg/dL 9.0 9.2 8.7(L)    BNP    Component Value Date/Time   BNP 34.2 01/12/2019 1855    ProBNP No results found for: PROBNP  PFT No results found for: FEV1PRE, FEV1POST, FVCPRE, FVCPOST, TLC, DLCOUNC, PREFEV1FVCRT, PSTFEV1FVCRT  No results found.   Past medical hx Past Medical History:  Diagnosis Date  . Anemia   . Anxiety   . Bronchitis   . Depression   . GERD (gastroesophageal reflux disease)   . Gestational  diabetes   . Iritis   . PCOS (polycystic ovarian syndrome)   . RA (rheumatoid arthritis) (HCC)      Social History   Tobacco Use  . Smoking status: Never Smoker  . Smokeless tobacco: Never Used  Substance Use Topics  . Alcohol use: Yes    Alcohol/week: 3.0 standard drinks    Types: 3 Glasses of wine per week    Comment: 3x a week  . Drug use: No    [name] reports that she has never smoked. She has never used smokeless tobacco. She reports current alcohol use of about 3.0 standard drinks of alcohol per week. She reports that she does not use drugs.  Tobacco Cessation: Never smoker  Past surgical hx, Family hx, Social hx all reviewed.  Current Outpatient Medications on File Prior to Visit  Medication Sig  . acetaminophen (TYLENOL) 500 MG tablet Take 2 tablets (1,000 mg total) by mouth every 8 (eight) hours as needed for moderate pain.  Marland Kitchen albuterol (PROVENTIL) (2.5 MG/3ML) 0.083% nebulizer solution Take 3 mLs by nebulization every 6 (six) hours as needed for wheezing or shortness of breath.  . Albuterol Sulfate (PROAIR RESPICLICK) 123XX123 (90 Base) MCG/ACT AEPB Inhale 2 puffs into the lungs every 6 (six) hours as needed (shortness of breath).   Marland Kitchen amLODipine (NORVASC) 5 MG tablet Take 1 tablet (5 mg total) by mouth daily.  . ARIPiprazole (ABILIFY) 5 MG tablet Take 1 tablet (5 mg total) by mouth daily.  . diclofenac (VOLTAREN) 75 MG EC tablet Take 150 mg by mouth 2 (two) times a day.  Marland Kitchen doxepin (SINEQUAN) 25 MG capsule Take 1 capsule (25 mg total) by mouth at bedtime as needed (insomnia).  . inFLIXimab (REMICADE IV) Inject into the vein every 6 (six) weeks.   . methotrexate (RHEUMATREX) 2.5 MG tablet Take 20 mg by mouth every Saturday.  . mometasone-formoterol (DULERA) 100-5 MCG/ACT AERO Inhale 2 puffs into the lungs 2 (two) times a day.  Marland Kitchen omeprazole (PRILOSEC) 40 MG capsule Take 1 capsule (40 mg total) by mouth 2 (two) times a day.  . predniSONE (DELTASONE) 20 MG tablet Take 1  tablet (20 mg total) by mouth daily with breakfast.   No current facility-administered medications on file prior to visit.      Allergies  Allergen Reactions  . 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 Celebrex [Celecoxib] Other (See Comments)    Chest pain  . Spironolactone Other (See Comments)    Bleeding    Review Of Systems:  Constitutional:   No  weight loss, night sweats,  Fevers, chills, fatigue, or  lassitude.  HEENT:   No headaches,  Difficulty swallowing,  Tooth/dental problems, or  Sore throat,                No sneezing, itching, ear ache,  nasal congestion, post nasal drip,   CV:  No chest pain,  Orthopnea, PND, swelling in lower extremities, anasarca, dizziness, palpitations, syncope.   GI  No heartburn, indigestion, abdominal pain, nausea, vomiting, diarrhea, change in bowel habits, loss of appetite, bloody stools.   Resp: + shortness of breath with exertion less at rest.  No excess mucus, no productive cough,  occasional  non-productive cough,  No coughing up of blood.  No change in color of mucus.  + wheezing.  No chest wall deformity  Skin: no rash or lesions.  GU: no dysuria, change in color of urine, no urgency or frequency.  No flank pain, no hematuria   MS:  No joint pain or swelling.  No decreased range of motion.  No back pain.  Psych:  No change in mood or affect. No depression or anxiety.  No memory loss.   Vital Signs BP (!) 132/100 (BP Location: Left Arm, Cuff Size: Large)   Pulse (!) 114   Temp 97.7 F (36.5 C) (Oral)   Ht 5' 3.5" (1.613 m)   Wt 224 lb 6.4 oz (101.8 kg)   SpO2 100%   BMI 39.13 kg/m    Physical Exam:  General- No distress,  A&Ox3, pleasant ENT: No sinus tenderness, TM clear, pale nasal mucosa, no oral exudate,no post nasal drip, no LAN Cardiac: S1, S2, regular rate and rhythm, no murmur Chest: + bilateral expiratory wheezing / No rales/ dullness; no accessory muscle use, no nasal flaring,  no sternal retractions Abd.: Soft Non-tender, ND, NT, BS +, Body mass index is 39.13 kg/m. Ext: No clubbing cyanosis, edema Neuro:  normal strength, MAE x 4, A&O x 3 Skin: No rashes, no lesions, warm and dry, intact Psych: normal mood and behavior   Assessment/Plan  Sarcoid Resolving flare Off prednisone Some wheezing since stopping prednisone Plan   CXR today We will call you with results We will increase Dulera to 200  2 puffs twice daily Rinse mouth after use Remember to use your rescue inhaler/ or nebs  as needed up to 3 times daily for breakthrough shortness of breath or wheezing.  Continue your methotrexate at 20 mg every Saturday as you have been doing for your sarcoid. We will do PFT's and adjust inhaler regimen based on results before you follow up appointment Dexa Scan 04/2019 as is scheduled.  Follow up with PCP regarding your blood pressure Please get your annual eye exam Follow up with Dr. Lamonte Sakai in 1 month>> please make sure PFT's are done prior to this visit.  Please contact office for sooner follow up if symptoms do not improve or worsen or seek emergency care   Work on weight loss now  you are off the prednisone. . Please contact office for sooner follow up if symptoms do not improve or worsen or seek emergency care    Rheumatoid arthritis with positive rheumatoid factor (HCC) Stable interval Plan Continue Methotrexate 20 mg every Saturday Continue Remicaid injection every 6 weeks per Rheumatology    Gastro-esophageal reflux disease with esophagitis States she is not aware of reflux on current regimen Increase in omeprazole to 40 mg BID has helped GERD Plan We will continue current dosing regimen Please follow GERD diet    On prednisone therapy Weaned Off prednisone  Plan Dexa Scan as is scheduled 04/2019   Cough Improved to resolved on after prednisone therapy    This appointment was 30 min long with over 50% of the time in direct  face-to-face  patient care, assessment, plan of care, and follow-up.   Magdalen Spatz, NP 03/27/2019  1:41 PM

## 2019-03-27 NOTE — Patient Instructions (Addendum)
It is good to see you today. We will do a CXR today. We will call you with results. We will increase your from Dulera 100 mg to the Dulera 200 mg to see if this better controls your wheezing We will give you a sample and call in a prescription for  the increase in dose.  Use this 2 puffs in the morning and 2 puffs in the evening.  Remember to use your rescue inhaler/ or nebs  as needed up to 3 times daily for breakthrough shortness of breath or wheezing.  Continue your methotrexate at 20 mg every Saturday as you have been doing for your sarcoid. We will do PFT's and adjust inhaler regimen based on results Dexa Scan 04/2019 as is scheduled.  Follow up with PCP regarding your blood pressure Please get your annual eye exam Follow up with Dr. Lamonte Sakai in 1 month>> please make sure PFT's are done prior to this visit.  Please contact office for sooner follow up if symptoms do not improve or worsen or seek emergency care

## 2019-03-27 NOTE — Addendum Note (Signed)
Addended by: Jannette Spanner on: 03/27/2019 02:38 PM   Modules accepted: Orders

## 2019-03-28 ENCOUNTER — Telehealth: Payer: Self-pay | Admitting: *Deleted

## 2019-03-28 NOTE — Telephone Encounter (Signed)
Message already sent 8/26 re: PFT

## 2019-03-29 NOTE — Telephone Encounter (Signed)
L/m on pt vm to call back to schedule pft -pr  °

## 2019-03-29 NOTE — Telephone Encounter (Signed)
Scheduled patient for pft on 9/25-pr

## 2019-04-17 ENCOUNTER — Other Ambulatory Visit: Payer: Self-pay | Admitting: Emergency Medicine

## 2019-04-23 ENCOUNTER — Other Ambulatory Visit (HOSPITAL_COMMUNITY)
Admission: RE | Admit: 2019-04-23 | Discharge: 2019-04-23 | Disposition: A | Discharge status: Home / Self Care | Payer: Medicaid Other | Source: Ambulatory Visit | Attending: Emergency Medicine | Admitting: Emergency Medicine

## 2019-04-23 DIAGNOSIS — Z01812 Encounter for preprocedural laboratory examination: Secondary | ICD-10-CM | POA: Insufficient documentation

## 2019-04-23 DIAGNOSIS — Z20828 Contact with and (suspected) exposure to other viral communicable diseases: Secondary | ICD-10-CM | POA: Diagnosis not present

## 2019-04-24 LAB — NOVEL CORONAVIRUS, NAA (HOSP ORDER, SEND-OUT TO REF LAB; TAT 18-24 HRS): SARS-CoV-2, NAA: NOT DETECTED

## 2019-04-25 ENCOUNTER — Ambulatory Visit
Admission: RE | Admit: 2019-04-25 | Discharge: 2019-04-25 | Disposition: A | Discharge status: Home / Self Care | Payer: Medicaid Other | Source: Ambulatory Visit | Attending: Acute Care | Admitting: Acute Care

## 2019-04-25 ENCOUNTER — Other Ambulatory Visit: Payer: Self-pay

## 2019-04-25 DIAGNOSIS — D869 Sarcoidosis, unspecified: Secondary | ICD-10-CM

## 2019-04-26 ENCOUNTER — Other Ambulatory Visit: Payer: Self-pay

## 2019-04-26 ENCOUNTER — Encounter: Payer: Self-pay | Admitting: Emergency Medicine

## 2019-04-26 ENCOUNTER — Ambulatory Visit: Payer: Medicaid Other | Admitting: Emergency Medicine

## 2019-04-26 ENCOUNTER — Ambulatory Visit (INDEPENDENT_AMBULATORY_CARE_PROVIDER_SITE_OTHER): Payer: Medicaid Other | Admitting: Emergency Medicine

## 2019-04-26 DIAGNOSIS — D869 Sarcoidosis, unspecified: Secondary | ICD-10-CM | POA: Diagnosis not present

## 2019-04-26 LAB — PULMONARY FUNCTION TEST
DL/VA % pred: 116 %
DL/VA: 5.11 ml/min/mmHg/L
DLCO unc % pred: 90 %
DLCO unc: 19.56 ml/min/mmHg
FEF 25-75 Post: 3.49 L/sec
FEF 25-75 Pre: 2.78 L/sec
FEF2575-%Change-Post: 25 %
FEF2575-%Pred-Post: 128 %
FEF2575-%Pred-Pre: 102 %
FEV1-%Change-Post: 6 %
FEV1-%Pred-Post: 98 %
FEV1-%Pred-Pre: 92 %
FEV1-Post: 2.44 L
FEV1-Pre: 2.28 L
FEV1FVC-%Change-Post: 2 %
FEV1FVC-%Pred-Pre: 102 %
FEV6-%Change-Post: 4 %
FEV6-%Pred-Post: 94 %
FEV6-%Pred-Pre: 90 %
FEV6-Post: 2.8 L
FEV6-Pre: 2.68 L
FEV6FVC-%Pred-Post: 102 %
FEV6FVC-%Pred-Pre: 102 %
FVC-%Change-Post: 4 %
FVC-%Pred-Post: 92 %
FVC-%Pred-Pre: 88 %
FVC-Post: 2.8 L
FVC-Pre: 2.68 L
Post FEV1/FVC ratio: 87 %
Post FEV6/FVC ratio: 100 %
Pre FEV1/FVC ratio: 85 %
Pre FEV6/FVC Ratio: 100 %
RV % pred: 85 %
RV: 1.41 L
TLC % pred: 82 %
TLC: 4.15 L

## 2019-04-26 NOTE — Progress Notes (Signed)
PFT done today. 

## 2019-04-26 NOTE — Assessment & Plan Note (Signed)
Sarcoidosis.  Her pulmonary function testing does not have any obstruction.  I think we can try to come off Dulera.  She is likely being adequately treated with methotrexate and Remicade, now off steroids.  She needs repeat CT chest to look for interval stability, improvement since she was treated for an acute flare this summer.  Please continue methotrexate and Remicade as you have been taking it, as directed by Dr. Tamera Punt Stop Ruthe Mannan for now.  Let us know if your symptoms worsen off this medication Use albuterol 2 puffs up to every 4 hours if needed for shortness of breath, chest tightness, wheezing.  Please keep track of how often you use this. You need an annual ophthalmology exam We will plan to repeat your CT scan of the chest in December to compare with June Please let us know if you develop any new breathing difficulty, new rash or any other concerning symptoms. Follow with Dr. Lamonte Sakai in December after your CT scan to review the results together

## 2019-04-26 NOTE — Progress Notes (Signed)
Subjective:    Patient ID: Anna Lucero, female    DOB: 31-Mar-1976, 43 y.o.   MRN: [id number]  HPI 43 year old never smoker with a history of obesity, GERD, rheumatoid arthritis on methotrexate since 6 months ago, PCOS, depression, borderline diabetes.  She was admitted in late May with dyspnea, cough, some wheeze, found to have an abnormal CT scan of the chest with bulky mediastinal lymphadenopathy as well as a cervical node.  This was biopsied by interventional radiology and was consistent with granulomatous lymphadenitis, most consistent with sarcoidosis, no evidence for lymphoma.  She was started on Qvar a few months ago, but never took consistently. Sent from the hospital and given a prednisone taper.  She was readmitted for dyspnea, discharged 6/19, for possible healthcare associated pneumonia.  She improved with antibiotics but also corticosteroids.  Plan to taper to 20 mg daily and then hold at that dose until this follow-up. Currently on '30mg'$ .   She describes improved breathing. Still with cough, better since the pred but still happens at night. Nebulized albuterol bid.   Has been on MTX for RA, follows with Dr Tamera Punt at [facility] out of it 2 weeks ago.   ROV 04/26/2019 --follow-up visit for patient who carries a history of rheumatoid arthritis which is been treated with methotrexate as well as Remicade.  When I first met her her methotrexate had been temporarily stopped and she had been admitted with mediastinal lymphadenopathy, cervical lymphadenopathy.  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  She was treated with prednisone which was subsequently tapered, now on 0.  She is back on methotrexate and Remicade as per Dr. Tamera Punt with rheumatology.  She underwent pulmonary function testing today which I reviewed and which show normal airflows without a bronchodilator  response, normal lung volumes, normal diffusion capacity.  Most recent imaging CT chest 01/12/2019 which showed persistent mediastinal lymphadenopathy, low lung volumes, scattered bilateral pulmonary opacities, narrowing of the bilateral upper lobe airways due to the lymphadenopathy. Her breathing is back to baseline, cough gone. She hears wheeze when she lays down supine, better when she gets up. She started [facility] last month - the wheeze seems to be a bit better.     Review of Systems  Respiratory: Positive for cough and shortness of breath.   Cardiovascular:       Irregular Heartbeats  Gastrointestinal:       Acid Reflux  Musculoskeletal: Positive for arthralgias.  Psychiatric/Behavioral: The patient is nervous/anxious.     Past Medical History:  Diagnosis Date  . Anemia   . Anxiety   . Bronchitis   . Depression   . GERD (gastroesophageal reflux disease)   . Gestational diabetes   . Iritis   . PCOS (polycystic ovarian syndrome)   . RA (rheumatoid arthritis) (HCC)      Family History  Problem Relation Age of Onset  . Alcohol abuse Mother   . Drug abuse Mother   . Bipolar disorder Mother   . Anxiety disorder Mother   . Depression Mother   . Alcohol abuse Father   . Drug abuse Father   . Anxiety disorder Sister   . Depression Sister   . Breast cancer Neg Hx      Social History   Socioeconomic History  . Marital status: Married    Spouse name: Not on file  . Number of children: 3  . Years  of education: Not on file  . Highest education level: Some college, no degree  Occupational History    Comment: full time  Social Needs  . Financial resource strain: Not hard at all  . Food insecurity    Worry: Never true    Inability: Never true  . Transportation needs    Medical: No    Non-medical: No  Tobacco Use  . Smoking status: Never Smoker  . Smokeless tobacco: Never Used  Substance and Sexual Activity  . Alcohol use: Yes    Alcohol/week: 3.0 standard drinks     Types: 3 Glasses of wine per week    Comment: 3x a week  . Drug use: No  . Sexual activity: Yes    Birth control/protection: I.U.D.  Lifestyle  . Physical activity    Days per week: 0 days    Minutes per session: 0 min  . Stress: Very much  Relationships  . Social Herbalist on phone: Not on file    Gets together: Not on file    Attends religious service: More than 4 times per year    Active member of club or organization: No    Attends meetings of clubs or organizations: Never    Relationship status: Married  . Intimate partner violence    Fear of current or ex partner: No    Emotionally abused: No    Physically abused: No    Forced sexual activity: No  Other Topics Concern  . Not on file  Social History Narrative  . 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   Allergies  Allergen Reactions  . 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 Celebrex [Celecoxib] Other (See Comments)    Chest pain  . Spironolactone Other (See Comments)    Bleeding     Outpatient Medications Prior to Visit  Medication Sig Dispense Refill  . acetaminophen (TYLENOL) 500 MG tablet Take 2 tablets (1,000 mg total) by mouth every 8 (eight) hours as needed for moderate pain. 30 tablet 0  . albuterol (PROVENTIL) (2.5 MG/3ML) 0.083% nebulizer solution Take 3 mLs by nebulization every 6 (six) hours as needed for wheezing or shortness of breath.    . Albuterol Sulfate (PROAIR RESPICLICK) 144 (90 Base) MCG/ACT AEPB Inhale 2 puffs into the lungs every 6 (six) hours as needed (shortness of breath).     . ARIPiprazole (ABILIFY) 5 MG tablet Take 1 tablet (5 mg total) by mouth daily. 90 tablet 0  . diclofenac (VOLTAREN) 75 MG EC tablet Take 150 mg by mouth 2 (two) times a day.    Marland Kitchen doxepin (SINEQUAN) 25 MG capsule Take 1 capsule (25 mg total) by mouth at bedtime as needed (insomnia). 90 capsule 0  . inFLIXimab (REMICADE IV) Inject into the vein every 6  (six) weeks.     . methotrexate (RHEUMATREX) 2.5 MG tablet Take 20 mg by mouth every Saturday.    Marland Kitchen omeprazole (PRILOSEC) 40 MG capsule Take 1 capsule (40 mg total) by mouth 2 (two) times a day. 60 capsule 5  . amLODipine (NORVASC) 5 MG tablet Take 1 tablet (5 mg total) by mouth daily. 30 tablet 0  . mometasone-formoterol (DULERA) 200-5 MCG/ACT AERO Inhale 2 puffs into the lungs 2 (two) times daily. 13 g 0  . predniSONE (DELTASONE) 20 MG tablet Take 1 tablet (20 mg total) by mouth daily with breakfast. 30  tablet 1   No facility-administered medications prior to visit.         Objective:   Physical Exam  Vitals:   04/26/19 1404  BP: 132/88  Pulse: (!) 109  SpO2: 97%  Weight: 216 lb (98 kg)  Height: '5\' 4"'$  (1.626 m)   Gen: Pleasant, overwt woman, in no distress,  normal affect  ENT: No lesions,  mouth clear,  oropharynx clear, no postnasal drip  Neck: No JVD, no stridor  Lungs: No use of accessory muscles, no crackles or wheezing on normal respiration, no wheeze on forced expiration  Cardiovascular: RRR, heart sounds normal, no murmur or gallops, no peripheral edema  Musculoskeletal: No deformities, no cyanosis or clubbing  Neuro: alert, awake, non focal  Skin: no rash      Assessment & Plan:  Sarcoid Sarcoidosis.  Her pulmonary function testing does not have any obstruction.  I think we can try to come off Dulera.  She is likely being adequately treated with methotrexate and Remicade, now off steroids.  She needs repeat CT chest to look for interval stability, improvement since she was treated for an acute flare this summer.  Please continue methotrexate and Remicade as you have been taking it, as directed by Dr. Tamera Punt Stop Ruthe Mannan for now.  Let us know if your symptoms worsen off this medication Use albuterol 2 puffs up to every 4 hours if needed for shortness of breath, chest tightness, wheezing.  Please keep track of how often you use this. You need an annual  ophthalmology exam We will plan to repeat your CT scan of the chest in December to compare with June Please let us know if you develop any new breathing difficulty, new rash or any other concerning symptoms. Follow with Dr. Lamonte Sakai in December after your CT scan to review the results together    Baltazar Apo, MD, PhD 04/26/2019, 2:28 PM [facility] [phone] or if no answer [phone]

## 2019-04-26 NOTE — Patient Instructions (Addendum)
Please continue methotrexate and Remicade as you have been taking it, as directed by Dr. Tamera Punt Stop Ruthe Mannan for now.  Let us know if your symptoms worsen off this medication Use albuterol 2 puffs up to every 4 hours if needed for shortness of breath, chest tightness, wheezing.  Please keep track of how often you use this. You need an annual ophthalmology exam We will plan to repeat your CT scan of the chest in December to compare with June Please let us know if you develop any new breathing difficulty, new rash or any other concerning symptoms. Follow with Dr. Lamonte Sakai in December after your CT scan to review the results together.

## 2019-05-02 ENCOUNTER — Telehealth: Payer: Self-pay | Admitting: Emergency Medicine

## 2019-05-02 NOTE — Progress Notes (Signed)
Pt aware of results of Bone density and recommendations to f/u with pcp re: calcium/vitamin d therapy. Nothing further needed.

## 2019-05-02 NOTE — Telephone Encounter (Signed)
Pt aware of results and recommendation of bone density. Please call patient and let her know her bone density test indicated Z-score in the total proximal femurs is below the expected range for age. Medical evaluation for secondary causes of low BMD may be appropriate.  Let her know she needs to follow up with PCP for optimization of calcium and vitamin D intake.  Please fax results to her PCP. Thanks

## 2019-05-02 NOTE — Telephone Encounter (Signed)
Copy of results faxed to pcp.

## 2019-06-11 ENCOUNTER — Encounter (HOSPITAL_COMMUNITY): Payer: Self-pay | Admitting: Psychiatry

## 2019-06-11 ENCOUNTER — Other Ambulatory Visit: Payer: Self-pay

## 2019-06-11 ENCOUNTER — Ambulatory Visit (INDEPENDENT_AMBULATORY_CARE_PROVIDER_SITE_OTHER): Payer: Medicaid Other | Admitting: Psychiatry

## 2019-06-11 DIAGNOSIS — F411 Generalized anxiety disorder: Secondary | ICD-10-CM

## 2019-06-11 DIAGNOSIS — F330 Major depressive disorder, recurrent, mild: Secondary | ICD-10-CM

## 2019-06-11 MED ORDER — ARIPIPRAZOLE 5 MG PO TABS: 5.0000 mg | ORAL_TABLET | Freq: Every day | ORAL | 0 refills | Status: DC

## 2019-06-11 MED ORDER — DOXEPIN HCL 50 MG PO CAPS: 50.0000 mg | ORAL_CAPSULE | Freq: Every day | ORAL | 0 refills | Status: DC
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
## 2019-06-13 ENCOUNTER — Telehealth (HOSPITAL_COMMUNITY): Payer: Self-pay

## 2019-06-13 NOTE — Telephone Encounter (Signed)
[place] TRACKS PRESCRIPTION COVERAGE APPROVED  ARIPIPRAZOLE 5MG  TABLET PA# [id number] EFFECTIVE 06/13/19 TO 06/07/2020

## 2019-07-16 ENCOUNTER — Ambulatory Visit (INDEPENDENT_AMBULATORY_CARE_PROVIDER_SITE_OTHER): Payer: Medicaid Other | Admitting: Licensed Clinical Social Worker

## 2019-07-16 ENCOUNTER — Other Ambulatory Visit: Payer: Self-pay

## 2019-07-16 DIAGNOSIS — F330 Major depressive disorder, recurrent, mild: Secondary | ICD-10-CM

## 2019-07-16 DIAGNOSIS — F411 Generalized anxiety disorder: Secondary | ICD-10-CM | POA: Diagnosis not present
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
## 2019-07-19 ENCOUNTER — Ambulatory Visit
Admission: RE | Admit: 2019-07-19 | Discharge: 2019-07-19 | Disposition: A | Discharge status: Home / Self Care | Payer: Medicaid Other | Source: Ambulatory Visit | Attending: Emergency Medicine | Admitting: Emergency Medicine

## 2019-07-19 DIAGNOSIS — D869 Sarcoidosis, unspecified: Secondary | ICD-10-CM

## 2019-07-23 ENCOUNTER — Other Ambulatory Visit: Payer: Self-pay | Admitting: Physician Assistant

## 2019-07-23 DIAGNOSIS — Z1231 Encounter for screening mammogram for malignant neoplasm of breast: Secondary | ICD-10-CM

## 2019-07-23 DIAGNOSIS — N63 Unspecified lump in unspecified breast: Secondary | ICD-10-CM

## 2019-07-31 DIAGNOSIS — Z1231 Encounter for screening mammogram for malignant neoplasm of breast: Secondary | ICD-10-CM

## 2019-08-21 ENCOUNTER — Ambulatory Visit: Payer: Medicaid Other | Admitting: Internal Medicine

## 2019-09-10 ENCOUNTER — Encounter (HOSPITAL_COMMUNITY): Payer: Self-pay | Admitting: Psychiatry

## 2019-09-10 ENCOUNTER — Other Ambulatory Visit: Payer: Self-pay

## 2019-09-10 ENCOUNTER — Ambulatory Visit (INDEPENDENT_AMBULATORY_CARE_PROVIDER_SITE_OTHER): Payer: Medicaid Other | Admitting: Psychiatry

## 2019-09-10 DIAGNOSIS — F411 Generalized anxiety disorder: Secondary | ICD-10-CM | POA: Diagnosis not present

## 2019-09-10 DIAGNOSIS — F330 Major depressive disorder, recurrent, mild: Secondary | ICD-10-CM

## 2019-09-10 MED ORDER — ARIPIPRAZOLE 5 MG PO TABS: 5.0000 mg | ORAL_TABLET | Freq: Every day | ORAL | 0 refills | Status: DC

## 2019-09-10 MED ORDER — DOXEPIN HCL 50 MG PO CAPS: 50.0000 mg | ORAL_CAPSULE | Freq: Every day | ORAL | 0 refills | Status: DC
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
## 2019-09-12 NOTE — Progress Notes (Signed)
Virtual Visit via Telephone Note  I connected with Anna Lucero on 09/13/19 at  9:30 AM EST by telephone and verified that I am speaking with the correct person using two identifiers.  Location: Patient: Home Provider: Office [facility] - [id number], [place], [place], [place] [address]   I discussed the limitations, risks, security and privacy concerns of performing an evaluation and management service by telephone and the availability of in person appointments. I also discussed with the patient that there may be a patient responsible charge related to this service. The patient expressed understanding and agreed to proceed.  Patient consented to consult via telephone: Yes People present and their role in pt care: Pt   History of Present Illness:  43 year old female never smoker followed in our office for sarcoidosis  Past medical history: Obesity, GERD, rheumatoid arthritis, obesity, sarcoid Smoking history: Never smoker Maintenance: Methotrexate, Orencia -managed by [facility] rheumatology Patient of Dr. Lamonte Sakai  Chief complaint: Wheezing   44 year old female never smoker followed in our office for history of pulmonary sarcoidosis.  She is reporting today that for 1 week she has had increased cough and wheezing.  The cough is productive with clear mucus.  She has not had any fevers.  She denies increased fatigue or chest wall pain.  She is concerned this could be a flare of her sarcoid as she has had the symptoms before.  Patient continues to keep follow-up with rheumatology.  She is now on Orencia instead of Remicade.  She continues to be maintained on methotrexate.  She had attempted to use her rescue inhaler for the symptoms.  She did not feel that this was helping with her wheezing.  This is what prompted her to contact our office.  Observations/Objective:  Social History   Tobacco Use  Smoking Status Never Smoker  Smokeless Tobacco Never Used    Immunization History  Administered Date(s) Administered  . Influenza Inj Mdck Quad With Preservative 09/29/2017  . Influenza Split 05/01/2018  . Influenza, Seasonal, Injecte, Preservative Fre 09/29/2017  . PPD Test 10/01/2018  . Tdap 06/01/2017    Assessment and Plan:  Rheumatoid arthritis with positive rheumatoid factor ([name]) Plan: Continue follow-up with rheumatology-[facility] [place], [place] Continue Orencia as managed by rheumatology Continue methotrexate as managed by rheumatology  Sarcoid 1 week of cough Congestion Wheezing  Plan: Prednisone taper today Offered x-ray today, patient declined due to weather Close follow-up with our office for in person evaluation next week Reviewed that if patient symptoms worsen she needs to present to the emergency room or urgent care   Follow Up Instructions:  Return in about 1 week (around 09/20/2019), or if symptoms worsen or fail to improve, for Follow up with Wyn Quaker FNP-C, Follow up with Dr. Lamonte Sakai.   I discussed the assessment and treatment plan with the patient. The patient was provided an opportunity to ask questions and all were answered. The patient agreed with the plan and demonstrated an understanding of the instructions.   The patient was advised to call back or seek an in-person evaluation if the symptoms worsen or if the condition fails to improve as anticipated.  I provided 22 minutes of non-face-to-face time during this encounter.   Lauraine Rinne, NP

## 2019-09-13 ENCOUNTER — Ambulatory Visit (INDEPENDENT_AMBULATORY_CARE_PROVIDER_SITE_OTHER): Payer: Medicaid Other | Admitting: Pulmonary Disease

## 2019-09-13 ENCOUNTER — Encounter: Payer: Self-pay | Admitting: Pulmonary Disease

## 2019-09-13 ENCOUNTER — Other Ambulatory Visit: Payer: Self-pay

## 2019-09-13 DIAGNOSIS — M05771 Rheumatoid arthritis with rheumatoid factor of right ankle and foot without organ or systems involvement: Secondary | ICD-10-CM

## 2019-09-13 DIAGNOSIS — D869 Sarcoidosis, unspecified: Secondary | ICD-10-CM | POA: Diagnosis not present

## 2019-09-13 MED ORDER — PREDNISONE 10 MG PO TABS: ORAL_TABLET | ORAL | 0 refills | Status: DC

## 2019-09-13 NOTE — Assessment & Plan Note (Signed)
Plan: Continue follow-up with rheumatology-[facility], [place] as managed by rheumatology Continue methotrexate as managed by rheumatology

## 2019-09-13 NOTE — Patient Instructions (Addendum)
You were seen today by Lauraine Rinne, NP  for:   1. Sarcoidosis  - predniSONE (DELTASONE) 10 MG tablet; 4 tabs for 2 days, then 3 tabs for 2 days, 2 tabs for 2 days, then 1 tab for 2 days, then stop  Dispense: 20 tablet; Refill: 0  Offered x-ray in our office today, patient declined due to weather  If symptoms worsen please present to an emergency room or urgent care for further evaluation  We will schedule close in person follow-up  2. Rheumatoid arthritis involving right ankle with positive rheumatoid factor ([place])  Continue follow-up with [facility] rheumatology Continue rheumatology medications as prescribed  Meds ordered this encounter  Medications  . predniSONE (DELTASONE) 10 MG tablet    Sig: 4 tabs for 2 days, then 3 tabs for 2 days, 2 tabs for 2 days, then 1 tab for 2 days, then stop    Dispense:  20 tablet    Refill:  0    Follow Up:    Return in about 1 week (around 09/20/2019), or if symptoms worsen or fail to improve, for Follow up with Wyn Quaker FNP-C, Follow up with Dr. Lamonte Sakai.   Please do your part to reduce the spread of COVID-19:      Reduce your risk of any infection  and COVID19 by using the similar precautions used for avoiding the common cold or flu:  Marland Kitchen Wash your hands often with soap and warm water for at least 20 seconds.  If soap and water are not readily available, use an alcohol-based hand sanitizer with at least 60% alcohol.  . If coughing or sneezing, cover your mouth and nose by coughing or sneezing into the elbow areas of your shirt or coat, into a tissue or into your sleeve (not your hands). Langley Gauss A MASK when in public  . Avoid shaking hands with others and consider head nods or verbal greetings only. . Avoid touching your eyes, nose, or mouth with unwashed hands.  . Avoid close contact with people who are sick. . Avoid places or events with large numbers of people in one location, like concerts or sporting events. . If you have some symptoms  but not all symptoms, continue to monitor at home and seek medical attention if your symptoms worsen. . If you are having a medical emergency, call 911.   [place] / e-Visit: [email or web address]         [facility]: [facility]: [id number]                   [facility]: [id number]     It is flu season:   >>> Best ways to protect herself from the flu: Receive the yearly flu vaccine, practice good hand hygiene washing with soap and also using hand sanitizer when available, eat a nutritious meals, get adequate rest, hydrate appropriately   Please contact the office if your symptoms worsen or you have concerns that you are not improving.   Thank you for choosing [facility] for your healthcare, and for allowing [place] to partner with you on your healthcare journey. I am thankful to be able to provide care to you today.   Wyn Quaker FNP-C

## 2019-09-13 NOTE — Assessment & Plan Note (Signed)
1 week of cough Congestion Wheezing  Plan: Prednisone taper today Offered x-ray today, patient declined due to weather Close follow-up with our office for in person evaluation next week Reviewed that if patient symptoms worsen she needs to present to the emergency room or urgent care

## 2019-09-20 ENCOUNTER — Ambulatory Visit: Payer: Medicaid Other | Admitting: Pulmonary Disease

## 2019-09-22 NOTE — Progress Notes (Deleted)
@Patient  ID: Anna Lucero, female    DOB: 1975/11/16, 44 y.o.   MRN: [id number]  No chief complaint on file.   Referring provider: Mittie Bodo  HPI:  44 year old female never smoker followed in our office for sarcoidosis  Past medical history: Obesity, GERD, rheumatoid arthritis, obesity, sarcoid Smoking history: Never smoker Maintenance: Methotrexate, Orencia -managed by [facility] rheumatology Patient of Dr. Lamonte Sakai 09/22/2019  - Visit     Questionaires / Pulmonary Flowsheets:   ACT:  No flowsheet data found.  MMRC: No flowsheet data found.  Epworth:  No flowsheet data found.  Tests:   FENO:  No results found for: NITRICOXIDE  PFT: PFT Results Latest Ref Rng & Units 04/26/2019  FVC-Pre L 2.68  FVC-Predicted Pre % 88  FVC-Post L 2.80  FVC-Predicted Post % 92  Pre FEV1/FVC % % 85  Post FEV1/FCV % % 87  FEV1-Pre L 2.28  FEV1-Predicted Pre % 92  FEV1-Post L 2.44  DLCO UNC% % 90  DLCO COR %Predicted % 116  TLC L 4.15  TLC % Predicted % 82  RV % Predicted % 85    WALK:  No flowsheet data found.  Imaging: No results found.  Lab Results:  CBC    Component Value Date/Time   WBC 17.2 (H) 01/18/2019 0411   RBC 4.39 01/18/2019 0411   HGB 11.8 (L) 01/18/2019 0411   HCT 39.2 01/18/2019 0411   PLT 367 01/18/2019 0411   MCV 89.3 01/18/2019 0411   MCH 26.9 01/18/2019 0411   MCHC 30.1 01/18/2019 0411   RDW 12.9 01/18/2019 0411   LYMPHSABS 1.4 01/16/2019 0430   MONOABS 0.8 01/16/2019 0430   EOSABS 0.3 01/16/2019 0430   BASOSABS 0.0 01/16/2019 0430    BMET    Component Value Date/Time   NA 136 01/18/2019 0411   K 4.0 01/18/2019 0411   CL 105 01/18/2019 0411   CO2 22 01/18/2019 0411   GLUCOSE 127 (H) 01/18/2019 0411   BUN 15 01/18/2019 0411   CREATININE 0.59 01/18/2019 0411   CALCIUM 9.0 01/18/2019 0411   GFRNONAA >60 01/18/2019 0411   GFRAA >60 01/18/2019 0411    BNP    Component Value Date/Time   BNP 34.2 01/12/2019 1855     ProBNP No results found for: PROBNP  Specialty Problems      Pulmonary Problems   Cough      Allergies  Allergen Reactions  . 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 Celebrex [Celecoxib] Other (See Comments)    Chest pain  . Spironolactone Other (See Comments)    Bleeding    Immunization History  Administered Date(s) Administered  . Influenza Inj Mdck Quad With Preservative 09/29/2017  . Influenza Split 05/01/2018  . Influenza, Seasonal, Injecte, Preservative Fre 09/29/2017  . PPD Test 10/01/2018  . Tdap 06/01/2017    Past Medical History:  Diagnosis Date  . Anemia   . Anxiety   . Bronchitis   . Depression   . GERD (gastroesophageal reflux disease)   . Gestational diabetes   . Iritis   . PCOS (polycystic ovarian syndrome)   . RA (rheumatoid arthritis) (HCC)     Tobacco History: Social History   Tobacco Use  Smoking Status Never Smoker  Smokeless Tobacco Never Used   Counseling given: Not Answered   Continue to not smoke  Outpatient Encounter Medications as of 09/23/2019  Medication Sig  . acetaminophen (TYLENOL) 500 MG  tablet Take 2 tablets (1,000 mg total) by mouth every 8 (eight) hours as needed for moderate pain.  Marland Kitchen albuterol (PROVENTIL) (2.5 MG/3ML) 0.083% nebulizer solution Take 3 mLs by nebulization every 6 (six) hours as needed for wheezing or shortness of breath.  . Albuterol Sulfate (PROAIR RESPICLICK) 123XX123 (90 Base) MCG/ACT AEPB Inhale 2 puffs into the lungs every 6 (six) hours as needed (shortness of breath).   . ARIPiprazole (ABILIFY) 5 MG tablet Take 1 tablet (5 mg total) by mouth daily.  . diclofenac (VOLTAREN) 75 MG EC tablet Take 150 mg by mouth 2 (two) times a day.  Marland Kitchen doxepin (SINEQUAN) 50 MG capsule Take 1 capsule (50 mg total) by mouth at bedtime.  . inFLIXimab (REMICADE IV) Inject into the vein every 6 (six) weeks.   . methotrexate (RHEUMATREX) 2.5 MG tablet Take 20 mg by mouth every Saturday.  Marland Kitchen  omeprazole (PRILOSEC) 40 MG capsule Take 1 capsule (40 mg total) by mouth 2 (two) times a day.  . predniSONE (DELTASONE) 10 MG tablet 4 tabs for 2 days, then 3 tabs for 2 days, 2 tabs for 2 days, then 1 tab for 2 days, then stop   No facility-administered encounter medications on file as of 09/23/2019.     Review of Systems  Review of Systems   Physical Exam  There were no vitals taken for this visit.  Wt Readings from Last 5 Encounters:  04/26/19 216 lb (98 kg)  03/27/19 224 lb 6.4 oz (101.8 kg)  02/18/19 221 lb 6.4 oz (100.4 kg)  01/21/19 218 lb 6.4 oz (99.1 kg)  01/12/19 215 lb (97.5 kg)    BMI Readings from Last 5 Encounters:  04/26/19 37.08 kg/m  03/27/19 39.13 kg/m  02/18/19 39.22 kg/m  01/21/19 38.08 kg/m  01/12/19 39.32 kg/m     Physical Exam    Assessment & Plan:   No problem-specific Assessment & Plan notes found for this encounter.    No follow-ups on file.   Lauraine Rinne, NP 09/22/2019   This appointment required *** minutes of patient care (this includes precharting, chart review, review of results, face-to-face care, etc.).

## 2019-09-23 ENCOUNTER — Ambulatory Visit: Payer: Medicaid Other | Admitting: Pulmonary Disease

## 2019-11-26 ENCOUNTER — Telehealth (INDEPENDENT_AMBULATORY_CARE_PROVIDER_SITE_OTHER): Payer: Medicaid Other | Admitting: Psychiatry

## 2019-11-26 ENCOUNTER — Encounter (HOSPITAL_COMMUNITY): Payer: Self-pay | Admitting: Psychiatry

## 2019-11-26 ENCOUNTER — Other Ambulatory Visit: Payer: Self-pay

## 2019-11-26 VITALS — Wt 220.0 lb

## 2019-11-26 DIAGNOSIS — F330 Major depressive disorder, recurrent, mild: Secondary | ICD-10-CM

## 2019-11-26 DIAGNOSIS — F411 Generalized anxiety disorder: Secondary | ICD-10-CM | POA: Diagnosis not present

## 2019-11-26 MED ORDER — ESCITALOPRAM OXALATE 10 MG PO TABS: ORAL_TABLET | ORAL | 1 refills | Status: DC

## 2019-11-26 MED ORDER — ARIPIPRAZOLE 5 MG PO TABS: 5.0000 mg | ORAL_TABLET | Freq: Every day | ORAL | 0 refills | Status: DC

## 2019-11-26 MED ORDER — DOXEPIN HCL 50 MG PO CAPS: 50.0000 mg | ORAL_CAPSULE | Freq: Every day | ORAL | 0 refills | Status: DC
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
## 2019-12-03 ENCOUNTER — Telehealth (HOSPITAL_COMMUNITY): Payer: Self-pay

## 2019-12-03 NOTE — Telephone Encounter (Signed)
Pt called requesting refill of doxepin, stating pharmacy would not fill it. Writer called pharmacy to inquire. Pharmacist states it was too soon to fill when pt requested it, however they are able to fill now. Pt called back and informed she would be able to pick it up from the pharmacy once filled today. Pt verbalized understanding.

## 2019-12-10 ENCOUNTER — Ambulatory Visit (HOSPITAL_COMMUNITY): Payer: Medicaid Other | Admitting: Psychiatry

## 2020-01-21 ENCOUNTER — Telehealth (INDEPENDENT_AMBULATORY_CARE_PROVIDER_SITE_OTHER): Payer: Medicaid Other | Admitting: Psychiatry

## 2020-01-21 ENCOUNTER — Other Ambulatory Visit: Payer: Self-pay

## 2020-01-21 DIAGNOSIS — F330 Major depressive disorder, recurrent, mild: Secondary | ICD-10-CM | POA: Diagnosis not present

## 2020-01-21 DIAGNOSIS — F411 Generalized anxiety disorder: Secondary | ICD-10-CM

## 2020-01-21 MED ORDER — ESCITALOPRAM OXALATE 10 MG PO TABS: 10.0000 mg | ORAL_TABLET | Freq: Every day | ORAL | 0 refills | Status: DC

## 2020-01-21 MED ORDER — DOXEPIN HCL 50 MG PO CAPS: 50.0000 mg | ORAL_CAPSULE | Freq: Every day | ORAL | 0 refills | Status: DC

## 2020-01-21 MED ORDER — ARIPIPRAZOLE 5 MG PO TABS: 5.0000 mg | ORAL_TABLET | Freq: Every day | ORAL | 0 refills | Status: DC
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
## 2020-02-11 ENCOUNTER — Other Ambulatory Visit: Payer: Self-pay | Admitting: Emergency Medicine

## 2020-02-26 ENCOUNTER — Other Ambulatory Visit (HOSPITAL_COMMUNITY): Payer: Self-pay | Admitting: Psychiatry

## 2020-02-26 DIAGNOSIS — F330 Major depressive disorder, recurrent, mild: Secondary | ICD-10-CM

## 2020-04-11 ENCOUNTER — Encounter (HOSPITAL_COMMUNITY): Payer: Self-pay | Admitting: Emergency Medicine

## 2020-04-11 ENCOUNTER — Emergency Department (HOSPITAL_COMMUNITY): Payer: Medicaid Other

## 2020-04-11 ENCOUNTER — Other Ambulatory Visit: Payer: Self-pay

## 2020-04-11 ENCOUNTER — Emergency Department (HOSPITAL_COMMUNITY)
Admission: EM | Admit: 2020-04-11 | Discharge: 2020-04-11 | Disposition: A | Discharge status: Home / Self Care | Payer: Medicaid Other | Attending: Emergency Medicine | Admitting: Emergency Medicine

## 2020-04-11 DIAGNOSIS — R05 Cough: Secondary | ICD-10-CM | POA: Diagnosis not present

## 2020-04-11 DIAGNOSIS — E669 Obesity, unspecified: Secondary | ICD-10-CM | POA: Diagnosis not present

## 2020-04-11 DIAGNOSIS — Z7951 Long term (current) use of inhaled steroids: Secondary | ICD-10-CM | POA: Diagnosis not present

## 2020-04-11 DIAGNOSIS — R059 Cough, unspecified: Secondary | ICD-10-CM

## 2020-04-11 DIAGNOSIS — D869 Sarcoidosis, unspecified: Secondary | ICD-10-CM

## 2020-04-11 DIAGNOSIS — Z20822 Contact with and (suspected) exposure to covid-19: Secondary | ICD-10-CM | POA: Insufficient documentation

## 2020-04-11 LAB — SARS CORONAVIRUS 2 BY RT PCR (HOSPITAL ORDER, PERFORMED IN ~~LOC~~ HOSPITAL LAB): SARS Coronavirus 2: NEGATIVE

## 2020-04-11 MED ORDER — PROAIR RESPICLICK 108 (90 BASE) MCG/ACT IN AEPB: 2.0000 | INHALATION_SPRAY | Freq: Four times a day (QID) | RESPIRATORY_TRACT | 0 refills | Status: DC | PRN

## 2020-04-11 MED ORDER — PREDNISONE 10 MG PO TABS: ORAL_TABLET | ORAL | 0 refills | Status: DC

## 2020-04-11 NOTE — ED Triage Notes (Signed)
Patient here from home reporting cough x1 week with wheezing. Hx Type 2 diabetes, no asthma.

## 2020-04-11 NOTE — Discharge Instructions (Signed)
Please take the prescribed prednisone until finished for your symptoms.  Please also use albuterol as needed for wheezing.  The prednisone may increase her blood sugars, please keep a close watch and continue to take your medications.  Please call [facility] office on Monday to schedule an appointment for the symptoms that you experienced today.  Return to the ER if you have any worsening shortness of breath, chest pain, fevers, chills, coughing up blood, etc.

## 2020-04-11 NOTE — ED Provider Notes (Signed)
[facility] DEPT Provider Note   CSN: [id number] Arrival date & time: 04/11/20  6720     History Chief Complaint  Patient presents with  . Cough    Anna Lucero is a 44 y.o. female.  HPI 44 year old female with a history of anemia, GERD, PCOS, RA on Remicade, sarcoid, obesity presents to the ER with complaints of cough with white sputum x1 week with some wheezing.  Patient states that she has been having bouts of cough which are causing her to be incontinent.  She states that when she gets these episodes, this is likely due to her sarcoid flareups.  She has been taking some Robitussin with little relief.  Not taking her inhaler.  She has not followed with pulmonology in "quite some time".  Per chart review, patient was seen in the office in February 2021.  She denies any recent travel, not on oral estrogen therapy.  No recent surgeries.  Denies any hemoptysis.  Denies any fevers or chills.  Denies any nasal congestion, headache, sore throat, abdominal pain, dysuria, or any other infectious symptoms.  She is not vaccinated for Covid.  No known Covid exposures.    Past Medical History:  Diagnosis Date  . Anemia   . Anxiety   . Bronchitis   . Depression   . GERD (gastroesophageal reflux disease)   . Gestational diabetes   . Iritis   . PCOS (polycystic ovarian syndrome)   . RA (rheumatoid arthritis) [facility])     Patient Active Problem List   Diagnosis Date Noted  . On prednisone therapy 02/18/2019  . Cough 01/21/2019  . Sarcoid   . Sepsis ([place]) 01/13/2019  . Lymphadenopathy, thoracic 12/31/2018  . SIRS (systemic inflammatory response syndrome) ([place]) 12/30/2018  . Obesity (BMI 30-39.9) 12/30/2018  . Class 2 obesity due to excess calories without serious comorbidity in adult 07/04/2018  . PTSD (post-traumatic stress disorder) 07/02/2018  . Nuclear sclerotic cataract of both eyes 04/09/2018  . Peripheral focal chorioretinal inflammation of both  eyes 04/09/2018  . Retinal edema 04/09/2018  . Maternal age 40+, multigravida, antepartum 01/03/2018  . Drug therapy 10/27/2017  . Uterine prolapse 09/26/2017  . Miscarriage 08/30/2017  . Anemia 05/25/2017  . Gastro-esophageal reflux disease with esophagitis 05/25/2017  . Polycystic ovaries 05/25/2017  . Rheumatoid arthritis with positive rheumatoid factor ([place]) 05/25/2017    Past Surgical History:  Procedure Laterality Date  . abdominal plasty    . BREAST REDUCTION SURGERY    . DILATION AND CURETTAGE OF UTERUS    . DILATION AND EVACUATION N/A 08/09/2017   Procedure: DILATATION AND EVACUATION;  Surgeon: Allyn Kenner, DO;  [facility];  Service: Gynecology;  Laterality: N/A;  . filopian tube removal    . REDUCTION MAMMAPLASTY Bilateral      OB History    Gravida  6   Para  3   Term  3   Preterm      AB  3   Living  3     SAB  1   TAB  1   Ectopic  1   Multiple      Live Births           Obstetric Comments  08/2017- D&C for MAB 12/2017- surgical TAB @ ~10 wks        Family History  Problem Relation Age of Onset  . Alcohol abuse Mother   . Drug abuse Mother   . Bipolar disorder Mother   .  Anxiety disorder Mother   . Depression Mother   . Alcohol abuse Father   . Drug abuse Father   . Anxiety disorder Sister   . Depression Sister   . Breast cancer Neg Hx     Social History   Tobacco Use  . Smoking status: Never Smoker  . Smokeless tobacco: Never Used  Vaping Use  . Vaping Use: Never used  Substance Use Topics  . Alcohol use: Yes    Alcohol/week: 3.0 standard drinks    Types: 3 Glasses of wine per week    Comment: 3x a week  . Drug use: No    Home Medications Prior to Admission medications   Medication Sig Start Date End Date Taking? Authorizing Provider  acetaminophen (TYLENOL) 500 MG tablet Take 2 tablets (1,000 mg total) by mouth every 8 (eight) hours as needed for moderate pain. 01/18/19   Elodia Florence., MD    Albuterol Sulfate (PROAIR RESPICLICK) 233 (90 Base) MCG/ACT AEPB Inhale 2 puffs into the lungs every 6 (six) hours as needed (shortness of breath). 04/11/20   Garald Balding, PA-C  ARIPiprazole (ABILIFY) 5 MG tablet Take 1 tablet (5 mg total) by mouth daily. 01/21/20 01/20/21  Arfeen, Arlyce Harman, MD  diclofenac (VOLTAREN) 75 MG EC tablet Take 150 mg by mouth 2 (two) times a day. 12/07/18   [provider]  doxepin (SINEQUAN) 50 MG capsule Take 1 capsule (50 mg total) by mouth at bedtime. 01/21/20   Arfeen, Arlyce Harman, MD  escitalopram (LEXAPRO) 10 MG tablet Take 1 tablet (10 mg total) by mouth daily. 01/21/20   Arfeen, Arlyce Harman, MD  inFLIXimab (REMICADE IV) Inject into the vein every 6 (six) weeks.     [provider]  methotrexate (RHEUMATREX) 2.5 MG tablet Take 20 mg by mouth every Saturday. 12/07/18   [provider]  omeprazole (PRILOSEC) 40 MG capsule Take 1 capsule (40 mg total) by mouth 2 (two) times a day. 01/21/19   Collene Gobble, MD  predniSONE (DELTASONE) 10 MG tablet 4 tabs for 2 days, then 3 tabs for 2 days, 2 tabs for 2 days, then 1 tab for 2 days, then stop 04/11/20   Sharyn Lull A, PA-C    Allergies    Lamictal [lamotrigine], Celebrex [celecoxib], and Spironolactone  Review of Systems   Review of Systems  Constitutional: Negative for chills and fever.  HENT: Negative for congestion and rhinorrhea.   Respiratory: Positive for cough and wheezing. Negative for chest tightness.   Cardiovascular: Negative for chest pain.  Gastrointestinal: Negative for abdominal pain.  Genitourinary: Negative for dysuria.    Physical Exam Updated Vital Signs BP 134/71 (BP Location: Left Arm)   Pulse 100   Temp 99.2 F (37.3 C) (Oral)   Resp 18   LMP 03/20/2020   SpO2 98%   Physical Exam Vitals and nursing note reviewed.  Constitutional:      General: She is not in acute distress.    Appearance: She is well-developed. She is obese. She is not ill-appearing, toxic-appearing  or diaphoretic.  HENT:     Head: Normocephalic and atraumatic.  Eyes:     Conjunctiva/sclera: Conjunctivae normal.  Cardiovascular:     Rate and Rhythm: Normal rate and regular rhythm.     Heart sounds: No murmur heard.   Pulmonary:     Effort: Pulmonary effort is normal. No respiratory distress.     Breath sounds: Normal breath sounds. No stridor. No wheezing, rhonchi or  rales.  Chest:     Chest wall: No tenderness.  Abdominal:     General: Abdomen is flat.     Palpations: Abdomen is soft.     Tenderness: There is no abdominal tenderness.  Musculoskeletal:     Cervical back: Neck supple.  Skin:    General: Skin is warm and dry.  Neurological:     Mental Status: She is alert.     ED Results / Procedures / Treatments   Labs (all labs ordered are listed, but only abnormal results are displayed) Labs Reviewed  SARS CORONAVIRUS 2 BY RT PCR (HOSPITAL ORDER, [place] [facility])    EKG None  Radiology DG Chest 2 View  Result Date: 04/11/2020 CLINICAL DATA:  Cough, productive cough and wheezing for 1-2 weeks, history of GERD. EXAM: CHEST - 2 VIEW COMPARISON:  March 27, 2019 FINDINGS: Mildly low lung volumes. Accounting for this cardiomediastinal contours are stable and normal. Subtle opacity in the region of the lingula is unchanged. No sign of effusion. On limited assessment skeletal structures are unremarkable. IMPRESSION: No acute cardiopulmonary disease with atelectasis or scarring in the region of the lingula, not changed. Electronically Signed   By: Zetta Bills M.D.   On: 04/11/2020 10:12    Procedures Procedures (including critical care time)  Medications Ordered in ED Medications - No data to display  ED Course  I have reviewed the triage vital signs and the nursing notes.  Pertinent labs & imaging results that were available during my care of the patient were reviewed by me and considered in my medical decision making (see chart for  details).    MDM Rules/Calculators/A&P                         44 year old female with cough and white sputum x1 week Presentation, she is alert, oriented, nontoxic-appearing, no acute distress, speaking full sentences without increased work of breathing.  Physical exam without noticeable wheezes or rhonchi.  Covid test is negative.  Chest x-ray without evidence of pneumonia, there is atelectasis and scarring in the region of the right lingula which has not changed.  Concern for PE is low as the patient is not tachycardic, tachypneic, denies any hemoptysis.  No risk factors for PE.  No evidence of pneumonia/ pneumothorax/fluid overload on chest x-ray.  Patient had a recent CT 6 months ago per chart review.  There is no evidence of acute respiratory distress and no indication to bring her into the hospital or order further imaging.  Per chart review, patient was seen in the office with [facility] with similar symptoms, was given a prednisone taper and albuterol.   I will send her home with this treatment, and encouraged her to call [facility], contact info given. Return precautions discussed which included fevers, chills, hemoptysis, worsening shortness of breath, etc. She was instructed that her blood sugars may increase with steroids and to keep a close watch on them. She voices understanding and is agreeable.  At this stage in the ED course, the patient has been adequately screened and stable for discharge.  Discussed the case with Dr. Vanita Panda who is agreeable to the above plan and disposition.  Final Clinical Impression(s) / ED Diagnoses Final diagnoses:  Cough    Rx / DC Orders ED Discharge Orders         Ordered    predniSONE (DELTASONE) 10 MG tablet  04/11/20 1716    Albuterol Sulfate (PROAIR RESPICLICK) 588 (90 Base) MCG/ACT AEPB  Every 6 hours PRN        04/11/20 1716           Lyndel Safe 04/11/20 1756    Carmin Muskrat, MD 04/11/20 1819

## 2020-04-12 ENCOUNTER — Telehealth: Payer: Self-pay | Admitting: Pulmonary Disease

## 2020-04-12 NOTE — Telephone Encounter (Signed)
04/12/2020  Patient was seen in the emergency room on 04/11/2020.  Patient was prescribed with a prednisone taper.  Patient's last visit with [place] was in February/2021 which was a virtual visit.  Patient was requested to have follow-up in 1 week.  This was never completed.  Please schedule patient for a follow-up appointment with Dr. Lamonte Sakai has next available.  Ideally this would be within the next 2 to 4 weeks.  If not anytime after that is okay.  This needs to be in office.  Wyn Quaker, FNP

## 2020-04-13 NOTE — Telephone Encounter (Signed)
Called and spoke with pt letting her know the info stated by Aaron Edelman and she verbalized understanding. Pt has been scheduled for a f/u with RB.  Nothing further needed.

## 2020-04-22 ENCOUNTER — Telehealth (HOSPITAL_COMMUNITY): Payer: Self-pay | Admitting: *Deleted

## 2020-04-22 ENCOUNTER — Other Ambulatory Visit: Payer: Self-pay

## 2020-04-22 ENCOUNTER — Telehealth (HOSPITAL_COMMUNITY): Payer: Medicaid Other | Admitting: Psychiatry

## 2020-04-22 NOTE — Telephone Encounter (Signed)
Pt called requesting refills on all meds. Pt missed her appointment today. Do you want me to refill for 30 days and make appointment?

## 2020-04-23 ENCOUNTER — Other Ambulatory Visit (HOSPITAL_COMMUNITY): Payer: Self-pay | Admitting: *Deleted

## 2020-04-23 DIAGNOSIS — F411 Generalized anxiety disorder: Secondary | ICD-10-CM

## 2020-04-23 DIAGNOSIS — F330 Major depressive disorder, recurrent, mild: Secondary | ICD-10-CM

## 2020-04-23 MED ORDER — DOXEPIN HCL 50 MG PO CAPS: 50.0000 mg | ORAL_CAPSULE | Freq: Every day | ORAL | 0 refills | Status: DC

## 2020-04-23 MED ORDER — ESCITALOPRAM OXALATE 10 MG PO TABS: 10.0000 mg | ORAL_TABLET | Freq: Every day | ORAL | 0 refills | Status: DC

## 2020-04-23 MED ORDER — ARIPIPRAZOLE 5 MG PO TABS: 5.0000 mg | ORAL_TABLET | Freq: Every day | ORAL | 0 refills | Status: DC

## 2020-04-23 NOTE — Telephone Encounter (Signed)
Yes. 30 days ok

## 2020-05-12 ENCOUNTER — Ambulatory Visit: Payer: Medicaid Other | Admitting: Emergency Medicine

## 2020-05-12 ENCOUNTER — Encounter: Payer: Self-pay | Admitting: Emergency Medicine

## 2020-05-12 ENCOUNTER — Other Ambulatory Visit: Payer: Self-pay

## 2020-05-12 DIAGNOSIS — D869 Sarcoidosis, unspecified: Secondary | ICD-10-CM | POA: Diagnosis not present

## 2020-05-12 DIAGNOSIS — R059 Cough, unspecified: Secondary | ICD-10-CM | POA: Diagnosis not present

## 2020-05-12 MED ORDER — OMEPRAZOLE 40 MG PO CPDR: 40.0000 mg | DELAYED_RELEASE_CAPSULE | Freq: Two times a day (BID) | ORAL | 11 refills | Status: DC

## 2020-05-12 NOTE — Assessment & Plan Note (Signed)
Her chest x-ray 04/11/2020 was reassuring.  Main symptom right now is cough.  She is off [facility] and has not missed it.  Suspect that methotrexate and Remicade have been controlling her disease.  Plan to get her back on these as soon as feasible after she gets her COVID-19 vaccination.  If she continues to have symptoms once she is back on her usual regimen then we will consider repeat CT scan of the chest to correlate with any pulmonary infiltrates.

## 2020-05-12 NOTE — Assessment & Plan Note (Signed)
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  Suspect this is at least in part responsible.  The cough responded quickly to prednisone but then returned when the prednisone was finished.  She is off her omeprazole and GERD may be a contributor as well.  We will get her back on this.  I think she needs to get her vaccination and then get back on her methotrexate, Remicade as soon as possible.  Once she does this we will assess her cough when she is on her baseline meds.

## 2020-05-12 NOTE — Progress Notes (Signed)
Subjective:    Patient ID: Anna Lucero, female    DOB: [birth year], 44 y.o.   MRN: [id number]  HPI  ROV 04/26/2019 --follow-up visit for patient who carries a history of rheumatoid arthritis which is been treated with methotrexate as well as Remicade.  When I first met her her methotrexate had been temporarily stopped and she had been admitted with mediastinal lymphadenopathy, cervical lymphadenopathy.  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  She was treated with prednisone which was subsequently tapered, now on 0.  She is back on methotrexate and Remicade as per Dr. Tamera Punt with rheumatology.  She underwent pulmonary function testing today which I reviewed and which show normal airflows without a bronchodilator response, normal lung volumes, normal diffusion capacity.  Most recent imaging CT chest 01/12/2019 which showed persistent mediastinal lymphadenopathy, low lung volumes, scattered bilateral pulmonary opacities, narrowing of the bilateral upper lobe airways due to the lymphadenopathy. Her breathing is back to baseline, cough gone. She hears wheeze when she lays down supine, better when she gets up. She started [facility] last month - the wheeze seems to be a bit better.   ROV 05/12/20 --44 year old with history of rheumatoid arthritis.  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  She has being managed with Remicade and methotrexate -the methotrexate was recently stopped about a month ago, so that she could get the COVID-19 vaccination.  She has not had this yet..  No evidence of obstruction on PFT.  I have not seen her since 04/26/2019.  At that time she was on [facility], stopped it because it wasn't doing much for her. She does not need her albuterol.  She was seen in the emergency department on 04/11/2020 for  progressive cough.  She reports that the cough started about 1 month ago, sometimes white/yellow. No blood.  She was treated with a prednisone taper - temporary improvement in cough.  She has been on omeprazole 40 mg twice daily in the past, stopped it  Chest x-ray 04/11/2020 reviewed by me showed slight linear opacity but stable without any other abnormalities present.    Review of Systems  Respiratory: Positive for cough and shortness of breath.   Cardiovascular:       Irregular Heartbeats  Gastrointestinal:       Acid Reflux  Musculoskeletal: Positive for arthralgias.  Psychiatric/Behavioral: The patient is nervous/anxious.     Past Medical History:  Diagnosis Date  . Anemia   . Anxiety   . Bronchitis   . Depression   . GERD (gastroesophageal reflux disease)   . Gestational diabetes   . Iritis   . PCOS (polycystic ovarian syndrome)   . RA (rheumatoid arthritis) ([facility])      Family History  Problem Relation Age of Onset  . Alcohol abuse Mother   . Drug abuse Mother   . Bipolar disorder Mother   . Anxiety disorder Mother   . Depression Mother   . Alcohol abuse Father   . Drug abuse Father   . Anxiety disorder Sister   . Depression Sister   . Breast cancer Neg Hx      Social History   Socioeconomic History  . Marital status: Married    Spouse name: Not on file  . Number of children: 3  . Years of education: Not on file  . Highest education level: Some college, no  degree  Occupational History    Comment: full time  Tobacco Use  . Smoking status: Never Smoker  . Smokeless tobacco: Never Used  Vaping Use  . Vaping Use: Never used  Substance and Sexual Activity  . Alcohol use: Yes    Alcohol/week: 3.0 standard drinks    Types: 3 Glasses of wine per week    Comment: 3x a week  . Drug use: No  . Sexual activity: Yes    Birth control/protection: I.U.D.  Other Topics Concern  . Not on file  Social History Narrative  . Not on file   Social Determinants of  Health   Financial Resource Strain:   . Difficulty of Paying Living Expenses: Not on file  Food Insecurity:   . Worried About Charity fundraiser in the Last Year: Not on file  . Ran Out of Food in the Last Year: Not on file  Transportation Needs:   . Lack of Transportation (Medical): Not on file  . Lack of Transportation (Non-Medical): Not on file  Physical Activity:   . Days of Exercise per Week: Not on file  . Minutes of Exercise per Session: Not on file  Stress:   . Feeling of Stress : Not on file  Social Connections:   . Frequency of Communication with Friends and Family: Not on file  . Frequency of Social Gatherings with Friends and Family: Not on file  . Attends Religious Services: Not on file  . Active Member of Clubs or Organizations: Not on file  . Attends Archivist Meetings: Not on file  . Marital Status: Not on file  Intimate Partner Violence:   . Fear of Current or Ex-Partner: Not on file  . Emotionally Abused: Not on file  . Physically Abused: Not on file  . 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   Allergies  Allergen Reactions  . 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 Celebrex [Celecoxib] Other (See Comments)    Chest pain  . Spironolactone Other (See Comments)    Bleeding     Outpatient Medications Prior to Visit  Medication Sig Dispense Refill  . acetaminophen (TYLENOL) 500 MG tablet Take 2 tablets (1,000 mg total) by mouth every 8 (eight) hours as needed for moderate pain. 30 tablet 0  . Albuterol Sulfate (PROAIR RESPICLICK) 673 (90 Base) MCG/ACT AEPB Inhale 2 puffs into the lungs every 6 (six) hours as needed (shortness of breath). 1 each 0  . ARIPiprazole (ABILIFY) 5 MG tablet Take 1 tablet (5 mg total) by mouth daily. 30 tablet 0  . diclofenac (VOLTAREN) 75 MG EC tablet Take 150 mg by mouth 2 (two) times a day.    Marland Kitchen doxepin (SINEQUAN) 50 MG capsule Take 1 capsule (50 mg  total) by mouth at bedtime. 30 capsule 0  . escitalopram (LEXAPRO) 10 MG tablet Take 1 tablet (10 mg total) by mouth daily. 30 tablet 0  . predniSONE (DELTASONE) 10 MG tablet 4 tabs for 2 days, then 3 tabs for 2 days, 2 tabs for 2 days, then 1 tab for 2 days, then stop 20 tablet 0  . inFLIXimab (REMICADE IV) Inject into the vein every 6 (six) weeks.     . methotrexate (RHEUMATREX) 2.5 MG tablet Take 20 mg by mouth every Saturday. (Patient not taking: Reported on 05/12/2020)    . omeprazole (PRILOSEC) 40 MG capsule Take  1 capsule (40 mg total) by mouth 2 (two) times a day. (Patient not taking: Reported on 05/12/2020) 60 capsule 5   No facility-administered medications prior to visit.        Objective:   Physical Exam  Vitals:   05/12/20 1638  BP: 126/72  Pulse: 96  Temp: 97.7 F (36.5 C)  TempSrc: Temporal  SpO2: 98%  Weight: 216 lb 3.2 oz (98.1 kg)  Height: 5' 2"  (1.575 m)   Gen: Pleasant, overwt woman, in no distress,  normal affect  ENT: No lesions,  mouth clear,  oropharynx clear, no postnasal drip  Neck: No JVD, no stridor  Lungs: No use of accessory muscles, no crackles or wheezing on normal respiration, no wheeze on forced expiration  Cardiovascular: RRR, heart sounds normal, no murmur or gallops, no peripheral edema  Musculoskeletal: No deformities, no cyanosis or clubbing  Neuro: alert, awake, non focal  Skin: no rash      Assessment & Plan:  Cough Cough is been a 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  Suspect this is at least in part responsible.  The cough responded quickly to prednisone but then returned when the prednisone was finished.  She is off her omeprazole and GERD may be a contributor as well.  We will get her back on this.  I think she needs to get her vaccination and then get back on her methotrexate, Remicade as soon as possible.  Once she does this we will assess her  cough when she is on her baseline meds.  Sarcoid Her chest x-ray 04/11/2020 was reassuring.  Main symptom right now is cough.  She is off [facility] and has not missed it.  Suspect that methotrexate and Remicade have been controlling her disease.  Plan to get her back on these as soon as feasible after she gets her COVID-19 vaccination.  If she continues to have symptoms once she is back on her usual regimen then we will consider repeat CT scan of the chest to correlate with any pulmonary infiltrates.   Baltazar Apo, MD, PhD 05/12/2020, 5:05 PM [facility] [phone] or if no answer [phone]

## 2020-05-12 NOTE — Patient Instructions (Signed)
Agree with getting your COVID-19 vaccination as soon as you are able.  Once you have had both vaccines then transition back onto your methotrexate and Remicade as directed by Dr. Tamera Punt. Restart your omeprazole 40 mg twice a day.  We will refill this for you today. Keep your albuterol available to use 2 puffs if needed for shortness of breath, chest tightness, wheezing, coughing. Follow with Dr. Lamonte Sakai in December.  Depending on how your cough and other symptoms are doing once you are back on your anti-inflammatory medication we will decide whether to repeat your CT scan of the chest.

## 2020-05-21 ENCOUNTER — Other Ambulatory Visit (HOSPITAL_COMMUNITY): Payer: Self-pay | Admitting: Psychiatry

## 2020-05-21 DIAGNOSIS — F411 Generalized anxiety disorder: Secondary | ICD-10-CM

## 2020-05-22 ENCOUNTER — Other Ambulatory Visit (HOSPITAL_COMMUNITY): Payer: Self-pay | Admitting: Psychiatry

## 2020-05-22 ENCOUNTER — Other Ambulatory Visit: Payer: Self-pay

## 2020-05-22 ENCOUNTER — Encounter (HOSPITAL_COMMUNITY): Payer: Self-pay | Admitting: Psychiatry

## 2020-05-22 ENCOUNTER — Telehealth (INDEPENDENT_AMBULATORY_CARE_PROVIDER_SITE_OTHER): Payer: Medicaid Other | Admitting: Psychiatry

## 2020-05-22 VITALS — Wt 216.0 lb

## 2020-05-22 DIAGNOSIS — F330 Major depressive disorder, recurrent, mild: Secondary | ICD-10-CM

## 2020-05-22 DIAGNOSIS — F4312 Post-traumatic stress disorder, chronic: Secondary | ICD-10-CM

## 2020-05-22 DIAGNOSIS — F419 Anxiety disorder, unspecified: Secondary | ICD-10-CM | POA: Diagnosis not present

## 2020-05-22 MED ORDER — DOXEPIN HCL 50 MG PO CAPS: 50.0000 mg | ORAL_CAPSULE | Freq: Every day | ORAL | 0 refills | Status: DC

## 2020-05-22 MED ORDER — ESCITALOPRAM OXALATE 10 MG PO TABS: 10.0000 mg | ORAL_TABLET | Freq: Every day | ORAL | 0 refills | Status: DC

## 2020-05-22 MED ORDER — ARIPIPRAZOLE 5 MG PO TABS: 5.0000 mg | ORAL_TABLET | Freq: Every day | ORAL | 0 refills | Status: DC
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
## 2020-05-28 ENCOUNTER — Ambulatory Visit (INDEPENDENT_AMBULATORY_CARE_PROVIDER_SITE_OTHER): Payer: Medicaid Other | Admitting: Clinical

## 2020-05-28 DIAGNOSIS — F4312 Post-traumatic stress disorder, chronic: Secondary | ICD-10-CM | POA: Diagnosis not present

## 2020-05-28 DIAGNOSIS — F330 Major depressive disorder, recurrent, mild: Secondary | ICD-10-CM | POA: Diagnosis not present

## 2020-05-28 DIAGNOSIS — F419 Anxiety disorder, unspecified: Secondary | ICD-10-CM

## 2020-05-28 IMAGING — DX PORTABLE CHEST - 1 VIEW
1 series · 1 of 1 positions shown · non-contrast
Comparison: Chest x-ray dated 12/30/2018

CLINICAL DATA: Cough and shortness of breath

EXAM:
PORTABLE CHEST 1 VIEW

[chest ap]
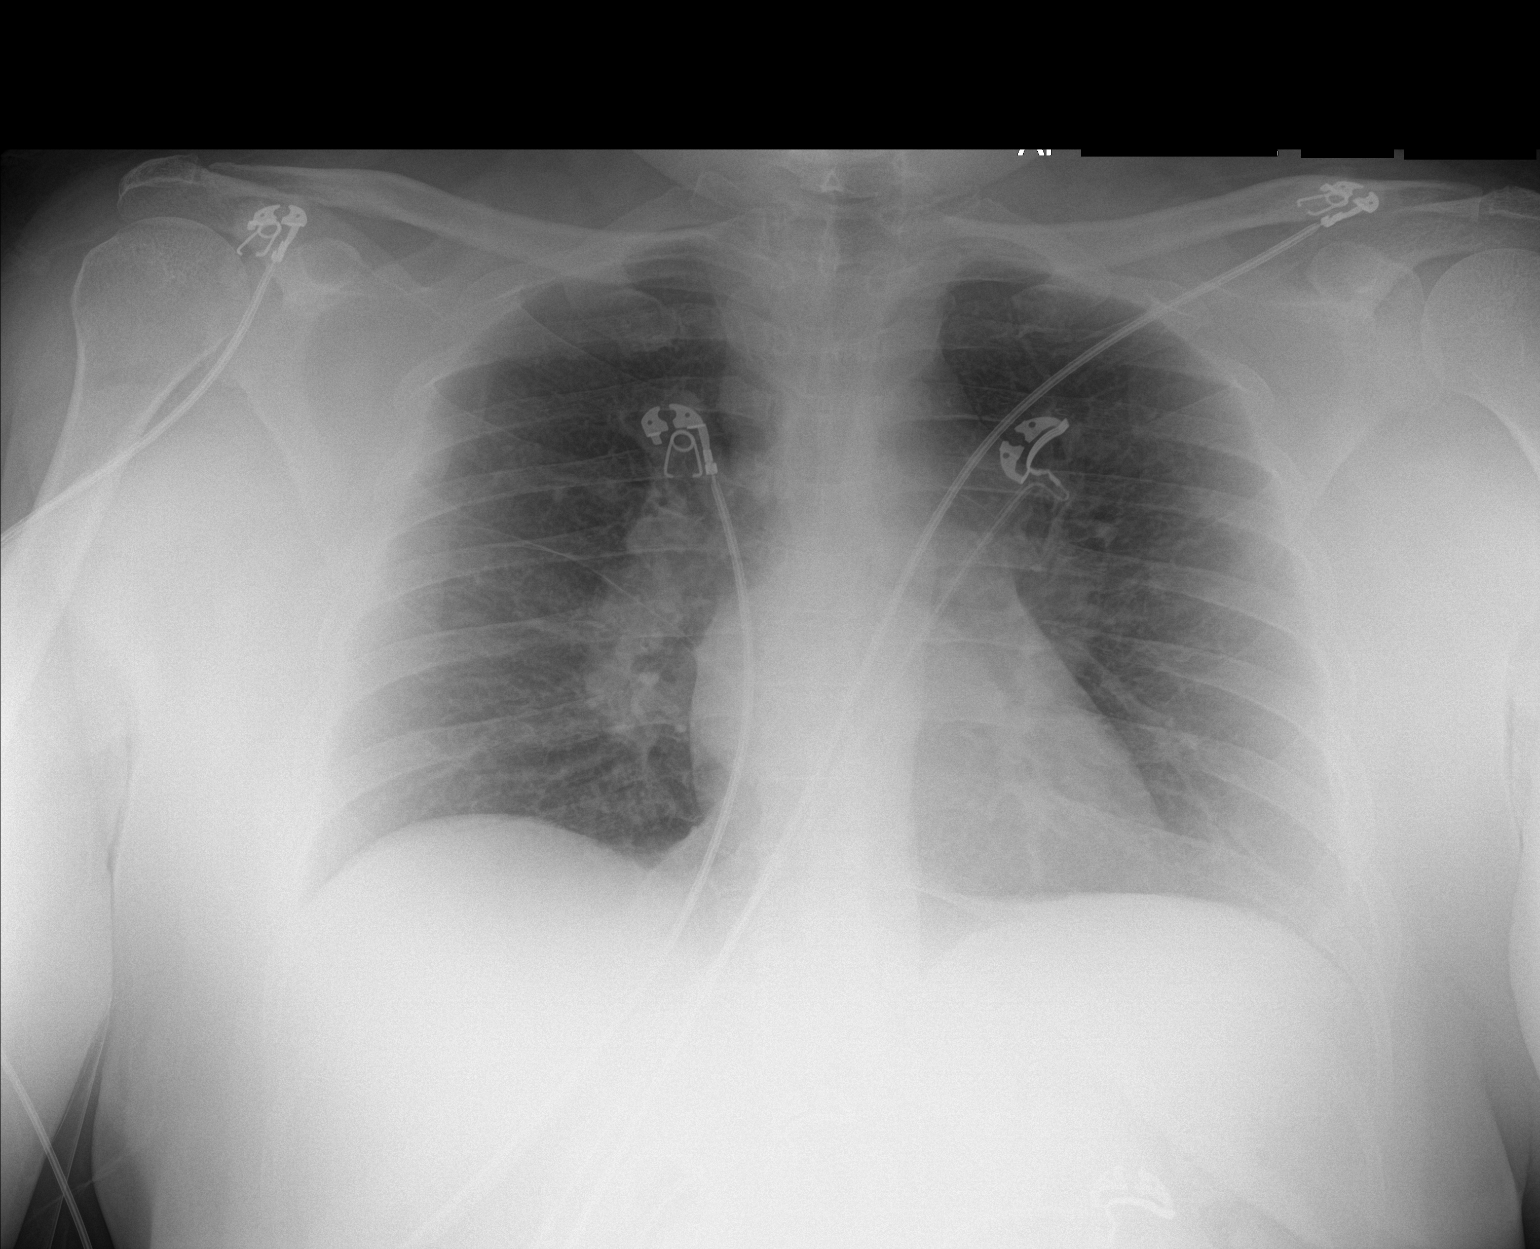

[1 of 1 positions shown; findings below may reference images not displayed]

FINDINGS: Heart size is normal. Again identified is bilateral hilar
adenopathy. There is no pneumothorax. No large pleural effusion. No
focal area of consolidation.
IMPRESSION: Stable appearance of the chest with no active disease.
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
## 2020-06-09 ENCOUNTER — Ambulatory Visit (INDEPENDENT_AMBULATORY_CARE_PROVIDER_SITE_OTHER): Payer: Medicaid Other | Admitting: Clinical

## 2020-06-09 ENCOUNTER — Other Ambulatory Visit: Payer: Self-pay

## 2020-06-09 DIAGNOSIS — F4312 Post-traumatic stress disorder, chronic: Secondary | ICD-10-CM | POA: Diagnosis not present

## 2020-06-09 DIAGNOSIS — F330 Major depressive disorder, recurrent, mild: Secondary | ICD-10-CM

## 2020-06-09 NOTE — Progress Notes (Signed)
   THERAPIST PROGRESS NOTE  Session Time: 10am  Participation Level: Active  Behavioral Response: CasualAlertAnxious  Type of Therapy: Individual Therapy  Treatment Goals addressed: Anxiety  Interventions: CBT and Assertiveness Training  Virtual Visit via Video Note  I connected with Anna Lucero on 06/09/20 at 10:00 AM EST by a video enabled telemedicine application and verified that I am speaking with the correct person using two identifiers.  Location: Patient: Home Provider: [facility]  I discussed the limitations of evaluation and management by telemedicine and the availability of in person appointments. The patient expressed understanding and agreed to proceed.  I discussed the assessment and treatment plan with the patient. The patient was provided an opportunity to ask questions and all were answered. The patient agreed with the plan and demonstrated an understanding of the instructions.  The patient was advised to call back or seek an in-person evaluation if the symptoms worsen or if the condition fails to improve as anticipated.  I provided 45 minutes of non-face-to-face time during this encounter.  Summary: Anna Lucero is a 44 y.o. female who presents with a history of depression, anxiety and trauma. Pt participated in completion of treatment plan. Pt reports improvement in her mood but says she is stressed about plans to move to a new city and new employment opportunity. Pt reports her husband is not in support of her returning to work until they move into their new home. 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 Pt identified mixed emotions. Pt also discussed difficulty establishing healthy boundaries with others. Pt states her self care routine consists of taking naps and taking medication as prescribed. She reports using avoidance as defense mechanism when faced with challenging situations.  [place], completed on 06/09/2020 Meet with clinician virtually, face to face or via phone weekly or biweekly for therapy sessions to assess progress towards personal goals and receive assistance/feedback as needed to address barriers to success. Reduce depression from average severity level of 6/10 down to a 3/10 in next 90 days by engaging in self-care routine daily focused upon healthy/ positive activities that improve mood and offer stress outlet.  Reduce anxiety from average severity level of 6/10 down to a 3/10 in next 90 days by practicing coping skills that result in reduction of anxiety and worry and improve daily functioning.  Suicidal/Homicidal:Pt denies plan to harm herself or others. Pt encouraged to call 911 or go to closest emergency department in the event of an emergency.  Therapist Response: Csw probed for root of avoidance and where it stems from. Discussed with pt short term relief avoidance provides. Introduced assertiveness techniques to assist pt in establishing healthy boundaries and improving communication with others. Assessed for changes in daily functioning, mood and behavior.  Plan: Return again in 1 weeks.  Diagnosis: Axis I: Major depressive disorder, recurrent, mild    Chronic PTSD    Axis II: No diagnosis    Yvette Rack, LCSW 06/09/2020

## 2020-06-19 ENCOUNTER — Ambulatory Visit (INDEPENDENT_AMBULATORY_CARE_PROVIDER_SITE_OTHER): Payer: Medicaid Other | Admitting: Clinical

## 2020-06-19 ENCOUNTER — Other Ambulatory Visit: Payer: Self-pay

## 2020-06-19 DIAGNOSIS — F419 Anxiety disorder, unspecified: Secondary | ICD-10-CM | POA: Diagnosis not present

## 2020-06-19 DIAGNOSIS — F330 Major depressive disorder, recurrent, mild: Secondary | ICD-10-CM | POA: Diagnosis not present

## 2020-06-19 NOTE — Progress Notes (Signed)
   THERAPIST PROGRESS NOTE  Session Time: 10am  Participation Level: Active  Behavioral Response: Casual, active  Type of Therapy: Individual  Treatment Goals addressed: Depression  Interventions:Psychoeducation, assertiveness  Virtual Visit via Video Note  I connected with Anna Lucero on 06/19/20 at 10:00 AM EST by a video enabled telemedicine application and verified that I am speaking with the correct person using two identifiers.  Location: Patient: Home Provider: [facility]   I discussed the limitations of evaluation and management by telemedicine and the availability of in person appointments. The patient expressed understanding and agreed to proceed.   I discussed the assessment and treatment plan with the patient. The patient was provided an opportunity to ask questions and all were answered. The patient agreed with the plan and demonstrated an understanding of the instructions.   The patient was advised to call back or seek an in-person evaluation if the symptoms worsen or if the condition fails to improve as anticipated.  I provided 50 minutes of non-face-to-face time during this encounter.  Summary: Anna Lucero is a 44 y.o. female who presents with a history of depression and anxiety. Pt reports she has started working at a new job and found a new home in [place]. Pt discussed experiencing work related stress and describes being "overworked and overwhelmed." Pt states having tendency to leave a job when she feels this way or take time off. Pt reports her and husband are working on making lifestyle changes such as reducing carb intake, increasing vegetable intake and drinking more water. Pt discussed challenges with implementing boundaries with family members and colleagues.  Suicidal/Homicidal:Pt denies plan to harm herself or others. Pt encouraged to call 911 or go to closest emergency department in the event of an emergency.   Therapist Response: CSW assessed  for changes in pt daily functioning, mood and behavior. Provided information on cycle of depression. Assisted pt in identifying healthy coping methods to manage stressors. Modeled assertiveness techniques and discussed importance of implementing healthy boundaries in relationships.  Plan: Return again in 1 weeks.  Diagnosis: Axis I: Major depressive disorder, recurrent, mild    Anxiety    Axis II: No diagnosis    Anna Rack, LCSW 06/19/2020

## 2020-06-23 ENCOUNTER — Ambulatory Visit (HOSPITAL_COMMUNITY): Payer: Medicaid Other | Admitting: Clinical

## 2020-06-30 ENCOUNTER — Other Ambulatory Visit: Payer: Self-pay

## 2020-06-30 ENCOUNTER — Ambulatory Visit (HOSPITAL_COMMUNITY): Payer: Medicaid Other | Admitting: Clinical

## 2020-06-30 NOTE — Progress Notes (Signed)
Patient ID: Anna Lucero, female   DOB: 01-29-1976, 44 y.o.   MRN: [id number] Pt is a no show for today's scheduled virtual appointment. CSW lvm at [phone], no response.

## 2020-07-09 ENCOUNTER — Other Ambulatory Visit: Payer: Self-pay

## 2020-07-09 ENCOUNTER — Ambulatory Visit (INDEPENDENT_AMBULATORY_CARE_PROVIDER_SITE_OTHER): Payer: Medicaid Other | Admitting: Clinical

## 2020-07-09 DIAGNOSIS — F330 Major depressive disorder, recurrent, mild: Secondary | ICD-10-CM

## 2020-07-09 DIAGNOSIS — F419 Anxiety disorder, unspecified: Secondary | ICD-10-CM

## 2020-07-09 NOTE — Progress Notes (Signed)
   THERAPIST PROGRESS NOTE  Session Time: 2pm  Participation Level: Active  Behavioral Response: unknown appearanceAlert"stressed"  Type of Therapy: Individual Therapy  Treatment Goals addressed: Coping  Interventions: CBT  Virtual Visit via Telephone Note  I connected with Anna Lucero on 07/09/20 at  2:00 PM EST by telephone and verified that I am speaking with the correct person using two identifiers.  Location: Patient: Home Provider: [facility]   I discussed the limitations, risks, security and privacy concerns of performing an evaluation and management service by telephone and the availability of in person appointments. I also discussed with the patient that there may be a patient responsible charge related to this service. The patient expressed understanding and agreed to proceed.   I discussed the assessment and treatment plan with the patient. The patient was provided an opportunity to ask questions and all were answered. The patient agreed with the plan and demonstrated an understanding of the instructions.   The patient was advised to call back or seek an in-person evaluation if the symptoms worsen or if the condition fails to improve as anticipated.  I provided 35 minutes of non-face-to-face time during this encounter.  Summary: Anna Lucero is a 44 y.o. female who presents with a history of depression and anxiety. Pt reports she quit her job two weeks ago and is feeling "stressed" Pt states she was working a third shift job in [place] but says she had difficulty seeing at nighttime. 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 When asked about her self care routine, pt states she she does not have one and has found herself "laying around" Pt reports she is experiencing burnout but is looking forward to having a spa day this weekend.  Suicidal/Homicidal: Pt denies  plan or intent to harm herself or others. Pt encouraged to call 911 or go to closest emergency department in the event of an emergency.  Therapist Response: CSW introduced to pt the cycle of depression. Discussed with pt sxs of burnout and compassion fatigue. Processed with pt importance of practicing routine self care and assigned task of identifying and implementing activities she can practice daily to improve daily functioning.   Plan: Return again in 2 weeks.  Diagnosis: Axis I: Major depressive disorder, recurrent, mild                                     Anxiety    Axis II: No diagnosis    Yvette Rack, LCSW 07/09/2020

## 2020-08-10 IMAGING — DX CHEST - 2 VIEW
2 series · 2 of 2 positions shown · non-contrast
Comparison: Chest radiograph 08/13/2018

CLINICAL DATA: Sarcoidosis follow up. Wheeze x 2 weeks after
completion of steroid course. Hx DM.

EXAM:
CHEST - 2 VIEW

[chest pa]
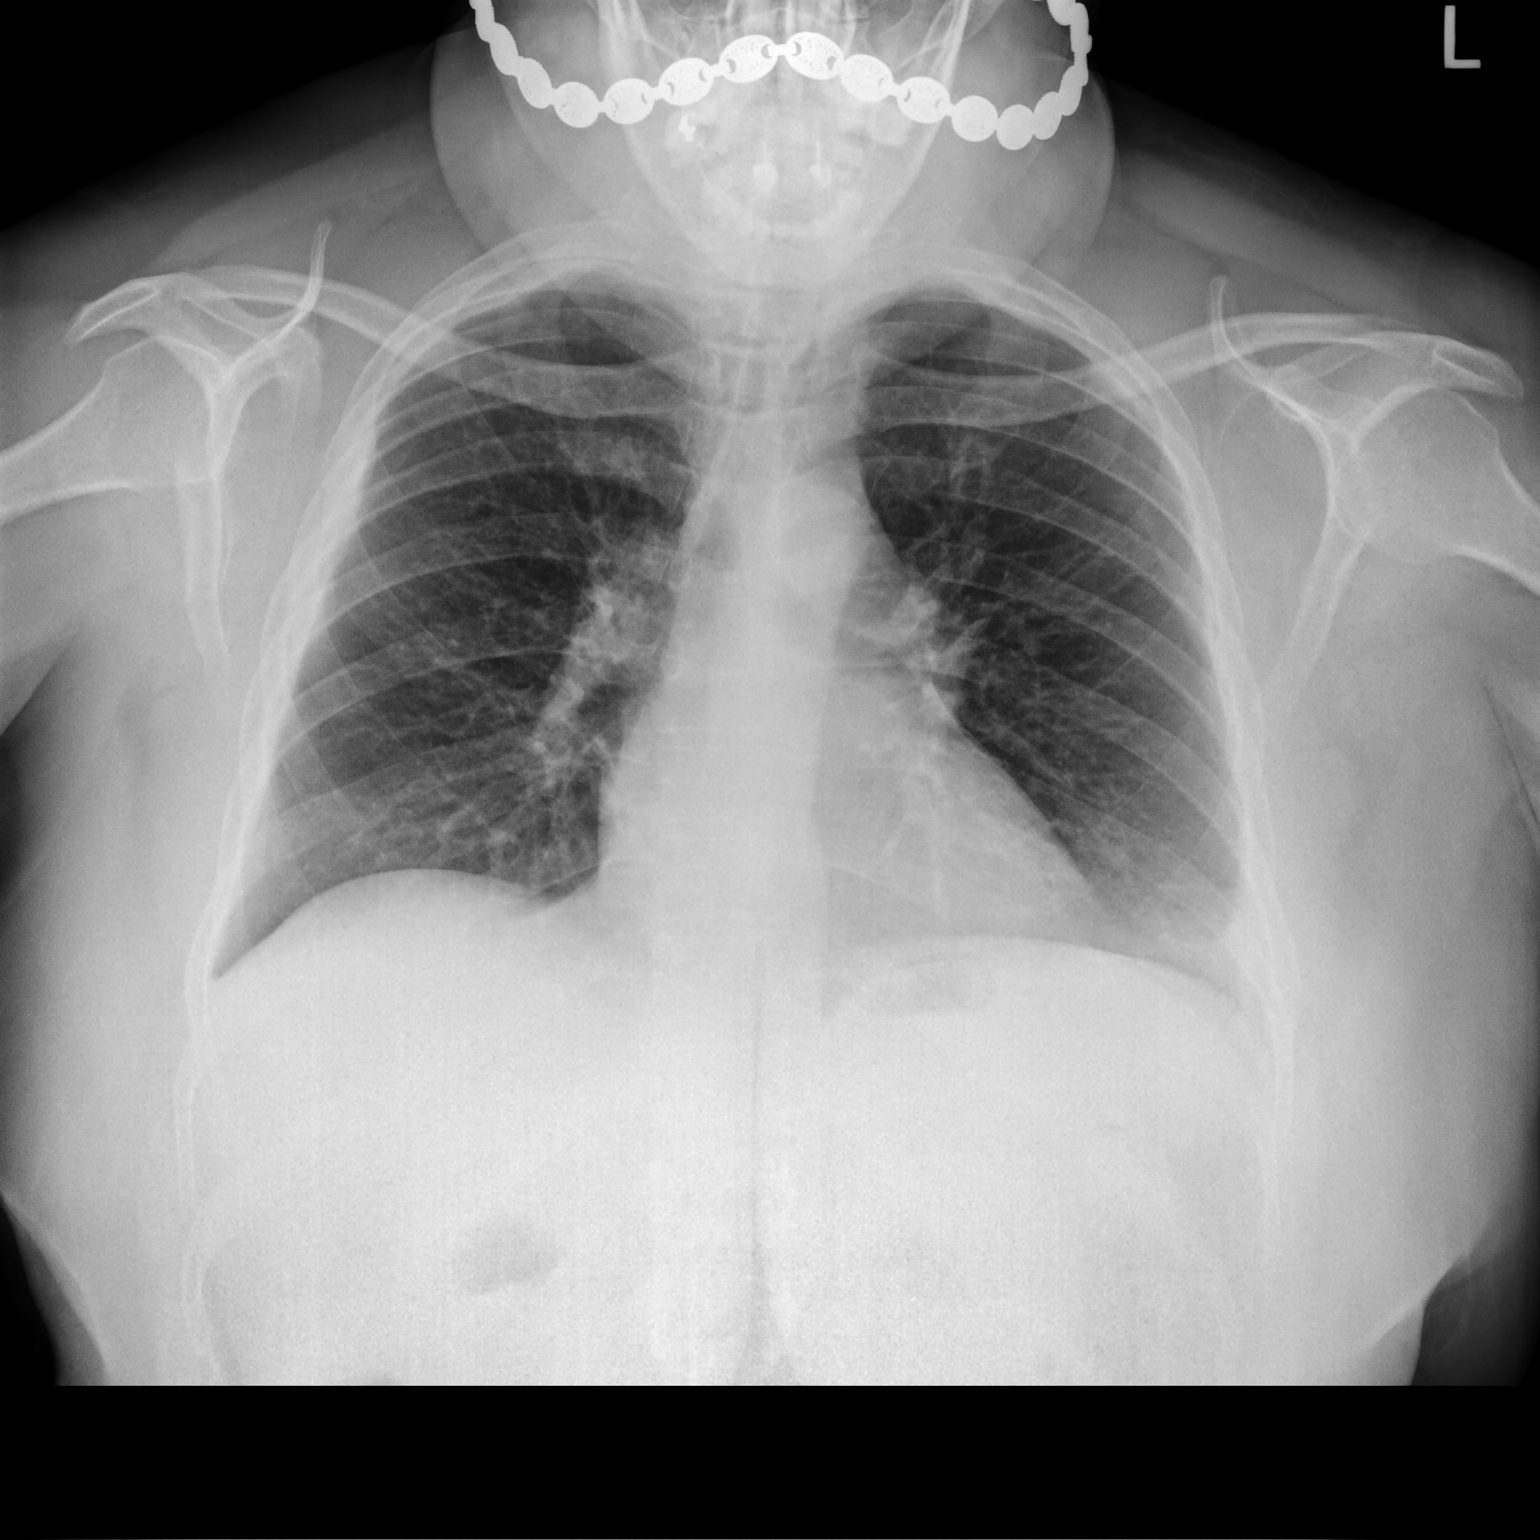

[chest lat]
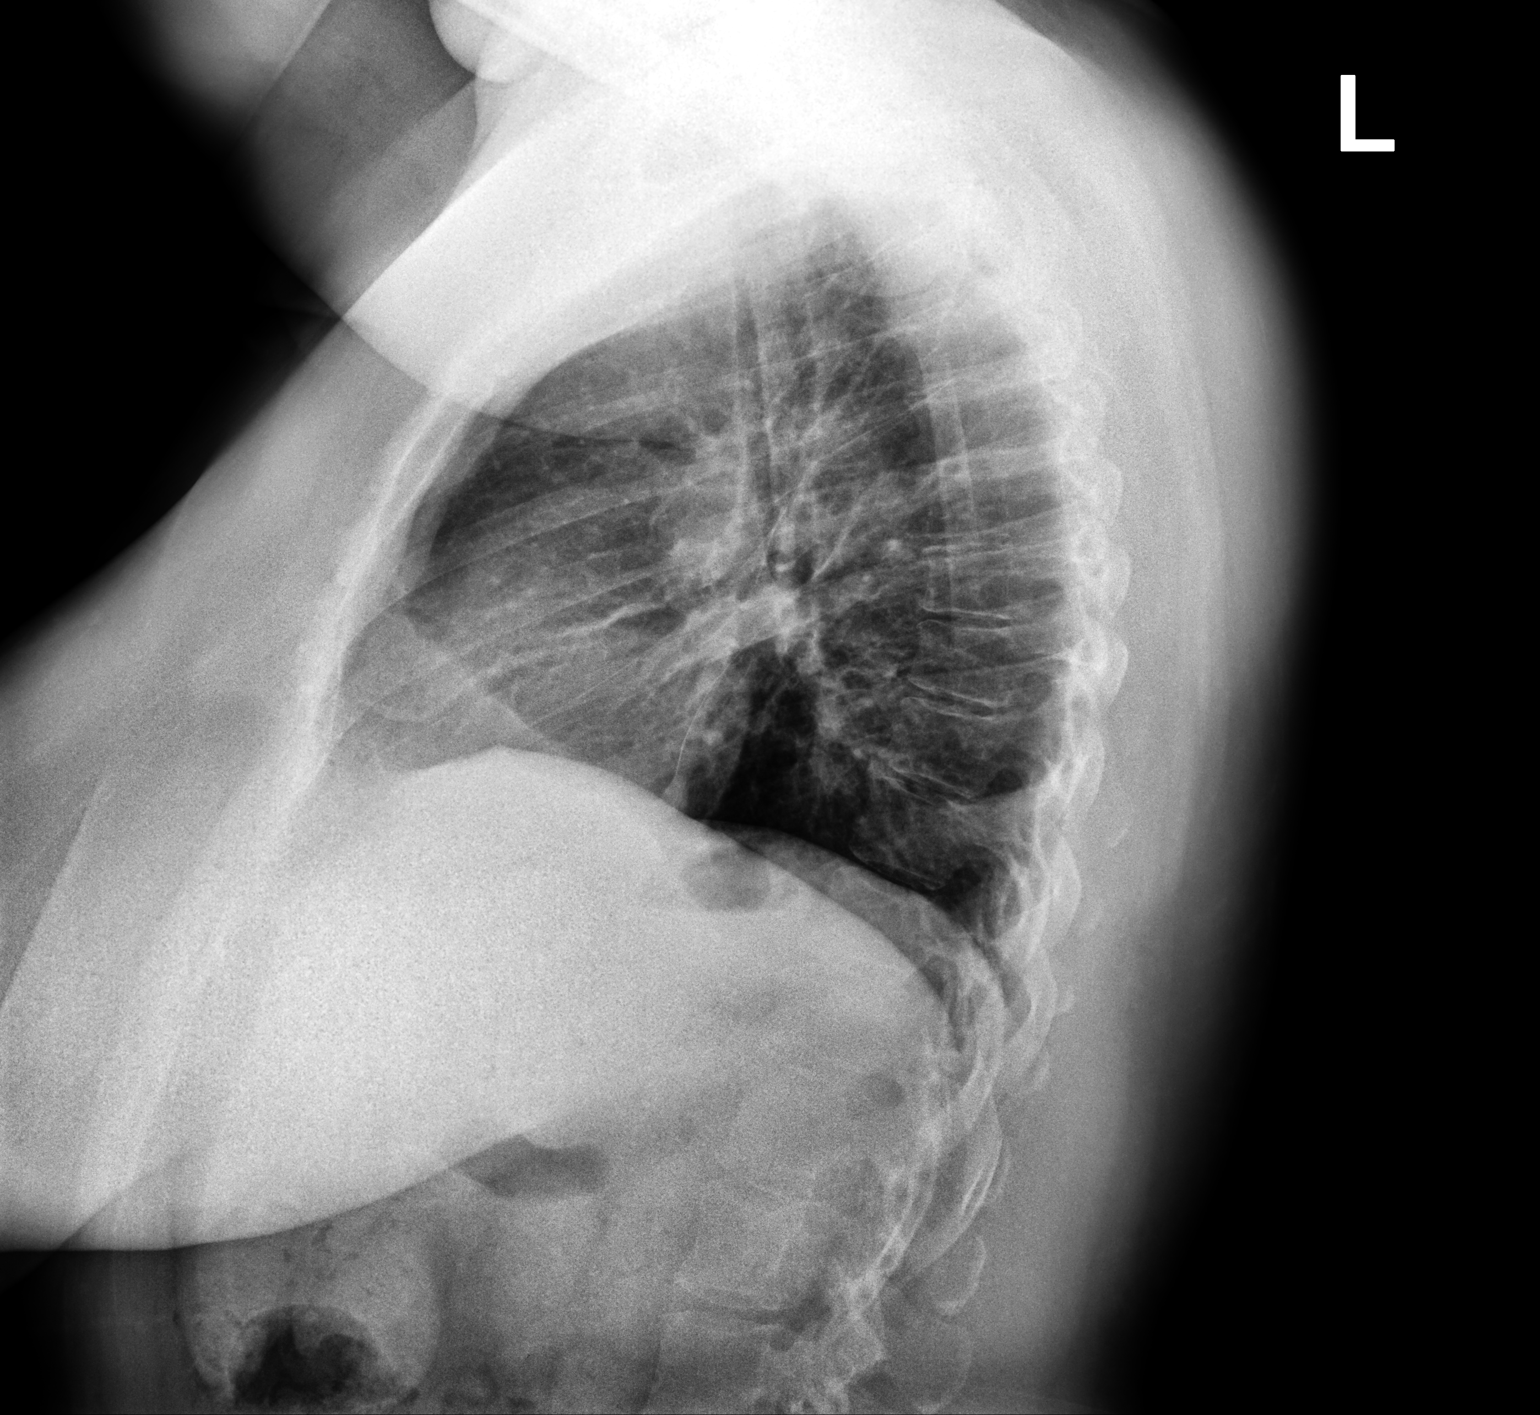

[2 of 2 positions shown; findings below may reference images not displayed]

FINDINGS: Normal heart size. The bilateral hilar adenopathy appears decreased
in size compared to prior. No new focal infiltrate. No pneumothorax
or pleural effusion. No acute osseous abnormality in the visualized
skeleton.
IMPRESSION: 1.  Decreased bilateral hilar adenopathy.

2.  No new acute pulmonary process.

## 2020-08-19 ENCOUNTER — Encounter (HOSPITAL_COMMUNITY): Payer: Self-pay | Admitting: Psychiatry

## 2020-08-19 ENCOUNTER — Telehealth (INDEPENDENT_AMBULATORY_CARE_PROVIDER_SITE_OTHER): Payer: Medicaid Other | Admitting: Psychiatry

## 2020-08-19 ENCOUNTER — Other Ambulatory Visit: Payer: Self-pay

## 2020-08-19 DIAGNOSIS — F419 Anxiety disorder, unspecified: Secondary | ICD-10-CM

## 2020-08-19 DIAGNOSIS — F4312 Post-traumatic stress disorder, chronic: Secondary | ICD-10-CM | POA: Diagnosis not present

## 2020-08-19 DIAGNOSIS — F330 Major depressive disorder, recurrent, mild: Secondary | ICD-10-CM | POA: Diagnosis not present

## 2020-08-19 MED ORDER — ESCITALOPRAM OXALATE 10 MG PO TABS: 10.0000 mg | ORAL_TABLET | Freq: Every day | ORAL | 0 refills | Status: DC

## 2020-08-19 MED ORDER — ARIPIPRAZOLE 5 MG PO TABS: 5.0000 mg | ORAL_TABLET | Freq: Every day | ORAL | 0 refills | Status: DC

## 2020-08-19 MED ORDER — DOXEPIN HCL 50 MG PO CAPS: 50.0000 mg | ORAL_CAPSULE | Freq: Every day | ORAL | 0 refills | Status: DC
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
## 2020-11-16 ENCOUNTER — Other Ambulatory Visit: Payer: Self-pay

## 2020-11-16 ENCOUNTER — Telehealth (INDEPENDENT_AMBULATORY_CARE_PROVIDER_SITE_OTHER): Payer: Medicaid Other | Admitting: Psychiatry

## 2020-11-16 ENCOUNTER — Encounter (HOSPITAL_COMMUNITY): Payer: Self-pay | Admitting: Psychiatry

## 2020-11-16 DIAGNOSIS — F419 Anxiety disorder, unspecified: Secondary | ICD-10-CM

## 2020-11-16 DIAGNOSIS — F4312 Post-traumatic stress disorder, chronic: Secondary | ICD-10-CM

## 2020-11-16 DIAGNOSIS — F330 Major depressive disorder, recurrent, mild: Secondary | ICD-10-CM | POA: Diagnosis not present

## 2020-11-16 MED ORDER — ESCITALOPRAM OXALATE 10 MG PO TABS: 10.0000 mg | ORAL_TABLET | Freq: Every day | ORAL | 0 refills | Status: DC

## 2020-11-16 MED ORDER — DOXEPIN HCL 50 MG PO CAPS: 50.0000 mg | ORAL_CAPSULE | Freq: Every day | ORAL | 0 refills | Status: DC

## 2020-11-16 MED ORDER — ARIPIPRAZOLE 5 MG PO TABS
5.0000 mg | ORAL_TABLET | Freq: Every day | ORAL | 0 refills | Status: DC
Start: 2020-11-16 — End: 2021-02-03
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
## 2020-11-25 ENCOUNTER — Telehealth (HOSPITAL_COMMUNITY): Payer: Self-pay

## 2020-11-25 NOTE — Telephone Encounter (Signed)
[employer] [place] PRES[employer] C[employer] APPROVED  ARIPIPRAZOLE 5MG  TABLET PA # [id number] EFFECTIVE 11/25/2020 TO 11/25/2021  PHARMACY NOTIFIED & PT WILL BE NOTIFIED WHEN MEDICATION IS READY FOR PICKUP VIA TEXT NOTIFICATION

## 2021-02-03 ENCOUNTER — Telehealth (INDEPENDENT_AMBULATORY_CARE_PROVIDER_SITE_OTHER): Payer: Medicaid Other | Admitting: Psychiatry

## 2021-02-03 ENCOUNTER — Encounter (HOSPITAL_COMMUNITY): Payer: Self-pay | Admitting: Psychiatry

## 2021-02-03 ENCOUNTER — Other Ambulatory Visit: Payer: Self-pay

## 2021-02-03 ENCOUNTER — Telehealth (HOSPITAL_COMMUNITY): Payer: Medicaid Other | Admitting: Psychiatry

## 2021-02-03 VITALS — Wt 214.0 lb

## 2021-02-03 DIAGNOSIS — R251 Tremor, unspecified: Secondary | ICD-10-CM | POA: Diagnosis not present

## 2021-02-03 DIAGNOSIS — F419 Anxiety disorder, unspecified: Secondary | ICD-10-CM

## 2021-02-03 DIAGNOSIS — F4312 Post-traumatic stress disorder, chronic: Secondary | ICD-10-CM

## 2021-02-03 DIAGNOSIS — F330 Major depressive disorder, recurrent, mild: Secondary | ICD-10-CM | POA: Diagnosis not present

## 2021-02-03 MED ORDER — DOXEPIN HCL 50 MG PO CAPS: 50.0000 mg | ORAL_CAPSULE | Freq: Every day | ORAL | 0 refills | Status: DC

## 2021-02-03 MED ORDER — ARIPIPRAZOLE 5 MG PO TABS
5.0000 mg | ORAL_TABLET | Freq: Every day | ORAL | 0 refills | Status: DC
Start: 2021-02-03 — End: 2021-05-06

## 2021-02-03 MED ORDER — ESCITALOPRAM OXALATE 10 MG PO TABS: 10.0000 mg | ORAL_TABLET | Freq: Every day | ORAL | 0 refills | Status: DC

## 2021-02-03 MED ORDER — BENZTROPINE MESYLATE 0.5 MG PO TABS: 0.5000 mg | ORAL_TABLET | Freq: Every day | ORAL | 2 refills | Status: DC
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
## 2021-05-06 ENCOUNTER — Telehealth (HOSPITAL_BASED_OUTPATIENT_CLINIC_OR_DEPARTMENT_OTHER): Payer: Medicaid Other | Admitting: Psychiatry

## 2021-05-06 ENCOUNTER — Encounter (HOSPITAL_COMMUNITY): Payer: Self-pay | Admitting: Psychiatry

## 2021-05-06 ENCOUNTER — Other Ambulatory Visit: Payer: Self-pay

## 2021-05-06 DIAGNOSIS — F419 Anxiety disorder, unspecified: Secondary | ICD-10-CM | POA: Diagnosis not present

## 2021-05-06 DIAGNOSIS — F330 Major depressive disorder, recurrent, mild: Secondary | ICD-10-CM | POA: Diagnosis not present

## 2021-05-06 MED ORDER — ARIPIPRAZOLE 5 MG PO TABS
5.0000 mg | ORAL_TABLET | Freq: Every day | ORAL | 0 refills | Status: DC
Start: 2021-05-06 — End: 2021-08-05

## 2021-05-06 MED ORDER — DOXEPIN HCL 50 MG PO CAPS: 50.0000 mg | ORAL_CAPSULE | Freq: Every day | ORAL | 0 refills | Status: DC
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
## 2021-07-27 ENCOUNTER — Other Ambulatory Visit: Payer: Self-pay | Admitting: Emergency Medicine

## 2021-08-02 ENCOUNTER — Other Ambulatory Visit (HOSPITAL_COMMUNITY): Payer: Self-pay | Admitting: Psychiatry

## 2021-08-02 DIAGNOSIS — F330 Major depressive disorder, recurrent, mild: Secondary | ICD-10-CM

## 2021-08-02 DIAGNOSIS — F419 Anxiety disorder, unspecified: Secondary | ICD-10-CM

## 2021-08-03 ENCOUNTER — Other Ambulatory Visit (HOSPITAL_COMMUNITY): Payer: Self-pay | Admitting: Psychiatry

## 2021-08-03 DIAGNOSIS — F419 Anxiety disorder, unspecified: Secondary | ICD-10-CM

## 2021-08-03 DIAGNOSIS — F330 Major depressive disorder, recurrent, mild: Secondary | ICD-10-CM

## 2021-08-05 ENCOUNTER — Other Ambulatory Visit: Payer: Self-pay

## 2021-08-05 ENCOUNTER — Telehealth (HOSPITAL_BASED_OUTPATIENT_CLINIC_OR_DEPARTMENT_OTHER): Payer: Medicaid Other | Admitting: Psychiatry

## 2021-08-05 ENCOUNTER — Encounter (HOSPITAL_COMMUNITY): Payer: Self-pay | Admitting: Psychiatry

## 2021-08-05 DIAGNOSIS — F419 Anxiety disorder, unspecified: Secondary | ICD-10-CM

## 2021-08-05 DIAGNOSIS — F330 Major depressive disorder, recurrent, mild: Secondary | ICD-10-CM | POA: Diagnosis not present

## 2021-08-05 MED ORDER — ARIPIPRAZOLE 5 MG PO TABS: 5.0000 mg | ORAL_TABLET | Freq: Every day | ORAL | 0 refills | Status: DC

## 2021-08-05 MED ORDER — DOXEPIN HCL 50 MG PO CAPS: 50.0000 mg | ORAL_CAPSULE | Freq: Every day | ORAL | 0 refills | Status: DC
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
## 2021-10-18 ENCOUNTER — Emergency Department (HOSPITAL_COMMUNITY)
Admission: EM | Admit: 2021-10-18 | Discharge: 2021-10-18 | Disposition: A | Discharge status: Home / Self Care | Payer: Medicaid Other | Attending: Emergency Medicine | Admitting: Emergency Medicine

## 2021-10-18 ENCOUNTER — Emergency Department (HOSPITAL_COMMUNITY): Payer: Medicaid Other

## 2021-10-18 ENCOUNTER — Encounter (HOSPITAL_COMMUNITY): Payer: Self-pay

## 2021-10-18 DIAGNOSIS — M25461 Effusion, right knee: Secondary | ICD-10-CM | POA: Insufficient documentation

## 2021-10-18 DIAGNOSIS — M25562 Pain in left knee: Secondary | ICD-10-CM | POA: Diagnosis not present

## 2021-10-18 DIAGNOSIS — M25561 Pain in right knee: Secondary | ICD-10-CM | POA: Diagnosis present

## 2021-10-18 MED ORDER — ACETAMINOPHEN 500 MG PO TABS
1000.0000 mg | ORAL_TABLET | Freq: Once | ORAL | Status: AC
  Administered 2021-10-18: 1000 mg via ORAL
  Filled 2021-10-18: qty 2

## 2021-10-18 NOTE — ED Provider Notes (Signed)
?[facility] DEPT ?Provider Note ? ? ?CSN: [id number] ?Arrival date & time: 10/18/21  4854 ? ?  ? ?History ? ?Chief Complaint  ?Patient presents with  ? Knee Pain  ?  Bilateral. Right worse.   ? ? ?Anna Lucero is a 46 y.o. female. ? ?Anna Lucero is a 45 y.o. female with a history of rheumatoid arthritis, PCOS, GERD, who presents to the emergency department for evaluation of knee pain.  Patient reports she has been having bilateral knee pain for the last 3 weeks but reports it is always been worse on the right than the left.  She reports about a year ago she fell onto her knee and ever since then has intermittently had issues with the right knee but has recently started having pain on the other side as well.  She reports she never got the knee evaluated after the initial fall.  She reports that she has been trying to use Aleve intermittently as well as Voltaren gel to help with knee pain but yesterday this got worse, which she thought may be due to the change in weather.  She denies any new injury to the knees.  She reports last week she called her rheumatologist who put her on steroids without much relief.  She reports that she does have a history of rheumatoid but has not previously had issues in her knees with this.  No associated fevers or chills, no overlying skin changes. ? ?The history is provided by the patient.  ? ?  ? ?Home Medications ?Prior to Admission medications   ?Medication Sig Start Date End Date Taking? Authorizing Provider  ?abatacept (ORENCIA) 250 MG injection Inject into the vein. 07/08/20 07/08/21  [provider]  ?acetaminophen (TYLENOL) 500 MG tablet Take 2 tablets (1,000 mg total) by mouth every 8 (eight) hours as needed for moderate pain. 01/18/19   Elodia Florence., MD  ?Albuterol Sulfate (PROAIR RESPICLICK) 627 (90 Base) MCG/ACT AEPB Inhale 2 puffs into the lungs every 6 (six) hours as needed (shortness of breath). 04/11/20   Garald Balding, PA-C  ?ARIPiprazole (ABILIFY) 5 MG tablet Take 1 tablet (5 mg total) by mouth daily. 08/05/21 08/05/22  Arfeen, Arlyce Harman, MD  ?benztropine (COGENTIN) 0.5 MG tablet Take 1 tablet (0.5 mg total) by mouth at bedtime. ?Patient not taking: Reported on 05/06/2021 02/03/21 02/03/22  Kathlee Nations, MD  ?diclofenac (VOLTAREN) 75 MG EC tablet Take 150 mg by mouth 2 (two) times a day. 12/07/18   [provider]  ?doxepin (SINEQUAN) 50 MG capsule Take 1 capsule (50 mg total) by mouth at bedtime. 08/05/21   Arfeen, Arlyce Harman, MD  ?escitalopram (LEXAPRO) 10 MG tablet Take 1 tablet (10 mg total) by mouth daily. ?Patient not taking: Reported on 05/06/2021 02/03/21   Kathlee Nations, MD  ?folic acid (FOLVITE) 1 MG tablet Take by mouth. ?Patient not taking: Reported on 11/16/2020 08/16/19   [provider]  ?omeprazole (PRILOSEC) 40 MG capsule TAKE 1 CAPSULE(40 MG) BY MOUTH IN THE MORNING AND AT BEDTIME 07/27/21   Collene Gobble, MD  ?sulfaSALAzine (AZULFIDINE) 500 MG tablet Take by mouth. 10/21/20   [provider]  ?   ? ?Allergies    ?Infliximab, Lamictal [lamotrigine], Celebrex [celecoxib], and Spironolactone   ? ?Review of Systems   ?Review of Systems  ?Constitutional:  Negative for chills and fever.  ?Cardiovascular:  Negative for leg swelling.  ?Musculoskeletal:  Positive for arthralgias.  ?Skin:  Negative for color change, rash and wound.  ?Neurological:  Negative for weakness and numbness.  ? ?Physical Exam ?Updated Vital Signs ?BP (!) 155/99 (BP Location: Right Arm)   Pulse 99   Temp 99.3 ?F (37.4 ?C) (Oral)   Resp 18   Ht '5\' 2"'$  (1.575 m)   Wt 98.9 kg   LMP 03/20/2020   SpO2 100%   BMI 39.87 kg/m?  ?Physical Exam ?Vitals and nursing note reviewed.  ?Constitutional:   ?   General: She is not in acute distress. ?   Appearance: Normal appearance. She is well-developed. She is not ill-appearing or diaphoretic.  ?HENT:  ?   Head: Normocephalic and atraumatic.  ?Eyes:  ?   General:     ?   Right eye: No  discharge.     ?   Left eye: No discharge.  ?Pulmonary:  ?   Effort: Pulmonary effort is normal. No respiratory distress.  ?Musculoskeletal:  ?   Comments: Bilateral knees with tenderness primarily over the anterior knee, worse on the right than left with a very slight effusion noted on the right, no overlying erythema or warmth, patient is able to fully flex and extend both knees without significant pain, distal pulses 2+, normal sensation and strength.  ?Neurological:  ?   Mental Status: She is alert and oriented to person, place, and time.  ?   Coordination: Coordination normal.  ?Psychiatric:     ?   Mood and Affect: Mood normal.     ?   Behavior: Behavior normal.  ? ? ?ED Results / Procedures / Treatments   ?Labs ?(all labs ordered are listed, but only abnormal results are displayed) ?Labs Reviewed - No data to display ? ?EKG ?None ? ?Radiology ?DG Knee Complete 4 Views Left ? ?Result Date: 10/18/2021 ?CLINICAL DATA:  Post fall 1 year ago with bilateral knee pain. EXAM: LEFT KNEE - COMPLETE 4+ VIEW; RIGHT KNEE - COMPLETE 4+ VIEW COMPARISON:  Contralateral knee of the same date. FINDINGS: LEFT knee: No sign of acute fracture, dislocation or joint effusion. RIGHT knee: Small to moderate suprapatellar effusion. No signs of acute fracture or dislocation. IMPRESSION: Small to moderate suprapatellar effusion on the RIGHT. No visible fracture. Negative evaluation of the LEFT knee. Electronically Signed   By: Zetta Bills M.D.   On: 10/18/2021 12:27  ? ?DG Knee Complete 4 Views Right ? ?Result Date: 10/18/2021 ?CLINICAL DATA:  Post fall 1 year ago with bilateral knee pain. EXAM: LEFT KNEE - COMPLETE 4+ VIEW; RIGHT KNEE - COMPLETE 4+ VIEW COMPARISON:  Contralateral knee of the same date. FINDINGS: LEFT knee: No sign of acute fracture, dislocation or joint effusion. RIGHT knee: Small to moderate suprapatellar effusion. No signs of acute fracture or dislocation. IMPRESSION: Small to moderate suprapatellar effusion on the  RIGHT. No visible fracture. Negative evaluation of the LEFT knee. Electronically Signed   By: Zetta Bills M.D.   On: 10/18/2021 12:27   ? ?Procedures ?Procedures  ? ? ?Medications Ordered in ED ?Medications  ?acetaminophen (TYLENOL) tablet 1,000 mg (1,000 mg Oral Given 10/18/21 1213)  ? ? ?ED Course/ Medical Decision Making/ A&P ?  ?                        ?This patient presents to the ED for concern of bilateral knee pain, this involves an extensive number of treatment options, and is a complaint that carries with it a high risk of complications and  morbidity.  The differential diagnosis includes arthritis, infection, inflammatory effusion, soft tissue injury, internal derangement ? ? ?Co morbidities that complicate the patient evaluation ? ?Rheumatoid arthritis ? ? ?Additional history obtained: ? ?Additional history obtained from chart review ?External records from outside source obtained and reviewed including recent outpatient rheumatology follow-up notes ? ? ?Imaging Studies ordered: ? ?I ordered imaging studies including bilateral knee x-rays ?I independently visualized and interpreted imaging which showed mild effusion on the right knee, but otherwise no fractures, dislocation or acute bony abnormalities bilaterally ?I agree with the radiologist interpretation ? ? ?Problem List / ED Course / Critical interventions / Medication management ? ?Patient with 3 weeks of progressively worsening bilateral knee pain worse on the right, injured the right knee a year ago with a fall but no new injury, underlying history of rheumatoid arthritis, on exam there is no significant swelling, redness, or fever, and patient is able to fully range both knees, extremely low suspicion for septic arthritis. ?I ordered medication including Tylenol for pain ?Reevaluation of the patient after these medicines showed that the patient improved ?I have reviewed the patients home medicines and have made adjustments as needed ?Feel  patient is appropriate for discharge home with outpatient orthopedic and rheumatology follow-up. ? ? ? ?Test / Admission - Considered: ? ?Based on reassuring work-up, I do not feel patient requires admission, knee pain i

## 2021-10-18 NOTE — ED Triage Notes (Signed)
Pt reports bilateral knee pain with right knee worse with swelling X1 week.  ? ?Pt reports going to PCP and given prednisone PRN without relief.  ? ?Pt reports she has rheumatoid arthritis but thinks the right knee is hurting worse due to falling about a year ago.  ? ?8/10 pain  ?Ambulatory in triage  ?A/Ox4  ?

## 2021-10-18 NOTE — Discharge Instructions (Addendum)
Your x-rays today overall look good, you do have a small effusion on the right knee no evidence of fracture.  Could be due to rheumatoid arthritis or some degree of osteoarthritis, please continue taking naproxen twice daily and Tylenol at 1000 mg up to 3 times daily as needed, you can also apply ice to the knees.  Please follow-up with your rheumatologist and with orthopedics. ? ?Return for new or worsening symptoms such as fever, significantly worsening joint pain and swelling, redness over the joint or inability to move the joint. ?

## 2021-11-04 ENCOUNTER — Other Ambulatory Visit (HOSPITAL_COMMUNITY): Payer: Self-pay | Admitting: Psychiatry

## 2021-11-04 ENCOUNTER — Encounter (HOSPITAL_COMMUNITY): Payer: Self-pay | Admitting: Psychiatry

## 2021-11-04 ENCOUNTER — Telehealth (HOSPITAL_BASED_OUTPATIENT_CLINIC_OR_DEPARTMENT_OTHER): Payer: Medicaid Other | Admitting: Psychiatry

## 2021-11-04 DIAGNOSIS — F330 Major depressive disorder, recurrent, mild: Secondary | ICD-10-CM

## 2021-11-04 DIAGNOSIS — F419 Anxiety disorder, unspecified: Secondary | ICD-10-CM

## 2021-11-04 MED ORDER — ARIPIPRAZOLE 5 MG PO TABS
5.0000 mg | ORAL_TABLET | Freq: Every day | ORAL | 0 refills | Status: DC
Start: 2021-11-04 — End: 2022-02-04

## 2021-11-04 MED ORDER — DOXEPIN HCL 25 MG PO CAPS: 25.0000 mg | ORAL_CAPSULE | Freq: Every day | ORAL | 0 refills | Status: DC
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
## 2022-02-04 ENCOUNTER — Encounter (HOSPITAL_COMMUNITY): Payer: Self-pay | Admitting: Psychiatry

## 2022-02-04 ENCOUNTER — Telehealth (HOSPITAL_BASED_OUTPATIENT_CLINIC_OR_DEPARTMENT_OTHER): Payer: Medicaid Other | Admitting: Psychiatry

## 2022-02-04 VITALS — Wt 214.0 lb

## 2022-02-04 DIAGNOSIS — F330 Major depressive disorder, recurrent, mild: Secondary | ICD-10-CM

## 2022-02-04 DIAGNOSIS — F419 Anxiety disorder, unspecified: Secondary | ICD-10-CM

## 2022-02-04 MED ORDER — ARIPIPRAZOLE 2 MG PO TABS: 2.0000 mg | ORAL_TABLET | Freq: Every day | ORAL | 1 refills | Status: DC

## 2022-02-04 MED ORDER — DOXEPIN HCL 50 MG PO CAPS: 50.0000 mg | ORAL_CAPSULE | Freq: Every day | ORAL | 1 refills | Status: DC

## 2022-02-04 MED ORDER — SERTRALINE HCL 50 MG PO TABS: ORAL_TABLET | ORAL | 1 refills | Status: DC
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
## 2022-03-18 ENCOUNTER — Encounter (HOSPITAL_COMMUNITY): Payer: Self-pay | Admitting: Psychiatry

## 2022-03-18 ENCOUNTER — Telehealth (HOSPITAL_BASED_OUTPATIENT_CLINIC_OR_DEPARTMENT_OTHER): Payer: Medicaid Other | Admitting: Psychiatry

## 2022-03-18 DIAGNOSIS — F330 Major depressive disorder, recurrent, mild: Secondary | ICD-10-CM | POA: Diagnosis not present

## 2022-03-18 DIAGNOSIS — F419 Anxiety disorder, unspecified: Secondary | ICD-10-CM | POA: Diagnosis not present

## 2022-03-18 MED ORDER — ARIPIPRAZOLE 2 MG PO TABS: 2.0000 mg | ORAL_TABLET | Freq: Every day | ORAL | 2 refills | Status: DC

## 2022-03-18 MED ORDER — SERTRALINE HCL 50 MG PO TABS: ORAL_TABLET | ORAL | 2 refills | Status: DC

## 2022-03-18 MED ORDER — DOXEPIN HCL 50 MG PO CAPS: 50.0000 mg | ORAL_CAPSULE | Freq: Every day | ORAL | 2 refills | Status: DC
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
## 2022-05-19 ENCOUNTER — Emergency Department (HOSPITAL_COMMUNITY): Payer: Medicaid Other

## 2022-05-19 ENCOUNTER — Other Ambulatory Visit: Payer: Self-pay

## 2022-05-19 ENCOUNTER — Emergency Department (HOSPITAL_COMMUNITY)
Admission: EM | Admit: 2022-05-19 | Discharge: 2022-05-20 | Discharge status: Against Medical Advice | Payer: Medicaid Other | Attending: Emergency Medicine | Admitting: Emergency Medicine

## 2022-05-19 ENCOUNTER — Encounter (HOSPITAL_COMMUNITY): Payer: Self-pay

## 2022-05-19 DIAGNOSIS — R059 Cough, unspecified: Secondary | ICD-10-CM | POA: Insufficient documentation

## 2022-05-19 DIAGNOSIS — R062 Wheezing: Secondary | ICD-10-CM | POA: Diagnosis not present

## 2022-05-19 DIAGNOSIS — Z5321 Procedure and treatment not carried out due to patient leaving prior to being seen by health care provider: Secondary | ICD-10-CM | POA: Insufficient documentation

## 2022-05-19 DIAGNOSIS — R093 Abnormal sputum: Secondary | ICD-10-CM | POA: Diagnosis not present

## 2022-05-19 LAB — CBC WITH DIFFERENTIAL/PLATELET
Abs Immature Granulocytes: 0.02 10*3/uL (ref 0.00–0.07)
Basophils Absolute: 0.1 10*3/uL (ref 0.0–0.1)
Basophils Relative: 1 %
Eosinophils Absolute: 0.6 10*3/uL — ABNORMAL HIGH (ref 0.0–0.5)
Eosinophils Relative: 6 %
HCT: 44.3 % (ref 36.0–46.0)
Hemoglobin: 14 g/dL (ref 12.0–15.0)
Immature Granulocytes: 0 %
Lymphocytes Relative: 31 %
Lymphs Abs: 3 10*3/uL (ref 0.7–4.0)
MCH: 27.8 pg (ref 26.0–34.0)
MCHC: 31.6 g/dL (ref 30.0–36.0)
MCV: 87.9 fL (ref 80.0–100.0)
Monocytes Absolute: 0.6 10*3/uL (ref 0.1–1.0)
Monocytes Relative: 6 %
Neutro Abs: 5.5 10*3/uL (ref 1.7–7.7)
Neutrophils Relative %: 56 %
Platelets: 289 10*3/uL (ref 150–400)
RBC: 5.04 MIL/uL (ref 3.87–5.11)
RDW: 13.6 % (ref 11.5–15.5)
WBC: 9.7 10*3/uL (ref 4.0–10.5)
nRBC: 0 % (ref 0.0–0.2)

## 2022-05-19 LAB — BASIC METABOLIC PANEL
Anion gap: 5 (ref 5–15)
BUN: 12 mg/dL (ref 6–20)
CO2: 25 mmol/L (ref 22–32)
Calcium: 9.1 mg/dL (ref 8.9–10.3)
Chloride: 109 mmol/L (ref 98–111)
Creatinine, Ser: 0.74 mg/dL (ref 0.44–1.00)
GFR, Estimated: >60 mL/min (ref >60)
Glucose, Bld: 112 mg/dL — ABNORMAL HIGH (ref 70–99)
Potassium: 3.7 mmol/L (ref 3.5–5.1)
Sodium: 139 mmol/L (ref 135–145)

## 2022-05-19 NOTE — ED Triage Notes (Signed)
Pt to ed c/o productive cough and wheezing x 1 week. States pcp prescribed augmentin and cough medicine but has not seemed to help

## 2022-05-19 NOTE — ED Provider Triage Note (Signed)
Emergency Medicine Provider Triage Evaluation Note  Anna Lucero , a 46 y.o. female  was evaluated in triage.  Pt with history of sarcoidosis and GERD complains of cough.  Cough started about a week ago.  Was seen at primary care clinic.  Prescribed Augmentin and Tessalon Perles for what was thought to be sinus infection.  Patient denies fever.  States that cough is productive.  Sputum is clear and white.  Patient also endorses wheezing but is not short of breath.  Denies chest pain.  Review of Systems  Positive: See above Negative: See above  Physical Exam  BP (!) 138/101   Pulse 95   Temp 99.1 F (37.3 C) (Oral)   Resp 17   Ht '5\' 2"'$  (1.575 m)   Wt 98 kg   LMP 03/20/2020   SpO2 95%   BMI 39.52 kg/m  Gen:   Awake, no distress   Resp:  Normal effort, diffuse wheezes and rhonchi MSK:   Moves extremities without difficulty  Other:    Medical Decision Making  Medically screening exam initiated at 9:27 PM.  Appropriate orders placed.  Anna Lucero was informed that the remainder of the evaluation will be completed by another provider, this initial triage assessment does not replace that evaluation, and the importance of remaining in the ED until their evaluation is complete.  Work-up initiated   Harriet Pho, PA-C 05/19/22 2131

## 2022-05-25 ENCOUNTER — Ambulatory Visit (INDEPENDENT_AMBULATORY_CARE_PROVIDER_SITE_OTHER): Payer: Medicaid Other | Admitting: Nurse Practitioner

## 2022-05-25 ENCOUNTER — Encounter: Payer: Self-pay | Admitting: Nurse Practitioner

## 2022-05-25 VITALS — BP 140/84 | HR 96 | Ht 62.0 in | Wt 215.8 lb

## 2022-05-25 DIAGNOSIS — J069 Acute upper respiratory infection, unspecified: Secondary | ICD-10-CM | POA: Diagnosis not present

## 2022-05-25 DIAGNOSIS — J209 Acute bronchitis, unspecified: Secondary | ICD-10-CM

## 2022-05-25 DIAGNOSIS — D869 Sarcoidosis, unspecified: Secondary | ICD-10-CM

## 2022-05-25 MED ORDER — PREDNISONE 10 MG PO TABS: ORAL_TABLET | ORAL | 0 refills | Status: DC

## 2022-05-25 MED ORDER — BUDESONIDE-FORMOTEROL FUMARATE 80-4.5 MCG/ACT IN AERO: 2.0000 | INHALATION_SPRAY | Freq: Two times a day (BID) | RESPIRATORY_TRACT | 5 refills | Status: DC

## 2022-05-25 NOTE — Patient Instructions (Addendum)
Continue Albuterol inhaler 2 puffs every 6 hours as needed for shortness of breath or wheezing. Notify if symptoms persist despite rescue inhaler/neb use.  Continue phenergan DM 5 mL every 6 hours as needed for cough Restart astelin nasal spray as directed for nasal congestion  Prednisone taper. 4 tabs for 3 days, then 3 tabs for 3 days, 2 tabs for 3 days, then 1 tab for 3 days, then stop. Take in AM with food. Start tomorrow Benzonatate 1 capsule Three times a day for cough  Symbicort 2 puffs Twice daily for the next two weeks then can use as needed for shortness of breath or wheezing. Brush tongue and rinse mouth afterwards Mucinex 600 mg Twice daily for congestion   Follow up in 2 weeks with Dr. Lamonte Sakai or Alanson Aly. If symptoms do not improve or worsen, please contact office for sooner follow up or seek emergency care.

## 2022-05-25 NOTE — Assessment & Plan Note (Signed)
Appears stable on imaging. See above plan.

## 2022-05-25 NOTE — Assessment & Plan Note (Signed)
Recent URI; treated for sinusitis with improvement in symptoms, now with unresolved cough. See above plan. Advised she restart nasal spray to target postnasal drainage and limit further upper airway irritation. Supportive care advised.

## 2022-05-25 NOTE — Progress Notes (Signed)
$'@Patient'A$  ID: Anna Lucero, female    DOB: July 07, 1976, 46 y.o.   MRN: [id number]  Chief Complaint  Patient presents with   Follow-up    Pt f/u she has been having coughing/wheezing for 2 weeks. Having SOB and increased phlegm. She has been on prednisone    Referring provider: Mittie Lucero  HPI: 46 year old female, never smoker followed for sarcoidosis. She is a patient of Anna Lucero and last seen in office 05/12/2020. Past medical history significant for GERD, PCOS, PTSD, obesity. She has rheumatoid arthritis, followed by rheumatology, on Orencia injections.   TEST/EVENTS:  04/26/2019 PFT: FVC 88, FEV1 92, ratio 87, TLC 82, DLCOunc 90. No BD; did have some midflow reversibility  07/19/2019 CT chest wo contrast: Resolved LAD. Residual thickening of the inferior left major fissure.  05/19/2022 CXR: there is minimal scarring in the lingula, unchanged. Otherwise, lungs are clear.   05/25/2022: Today - acute Patient presents today for acute visit. Around two weeks ago, she developed URI and productive cough with yellow sputum. She was evaluated by her PCP on 10/11 and treated for sinusitis with augmentin course and supportive care. She then went to the ED on 10/19 with increased cough and SOB. She left because the wait was too long. Had a televisit with her PCP on 10/20. Started on prednisone and cough syrup. CXR was without any acute process. Today, she reports feeling relatively unchanged. Still has a persistent cough and wheezing. Cough is productive with yellow sputum, unchanged. Feels more short winded than she usually does, especially with coughing spells. She does feel like her URI symptoms have resolved for the most part. Denies fevers, chills, night sweats, hemoptysis. No calf pain or swelling. She has an albuterol inhaler, which she isn't using much. Taking the cough syrup twice a day. Not using tessalon. She has one day left of prednisone.   Allergies  Allergen Reactions    Infliximab Itching and Shortness Of Breath    Patient was premedicated   Patient was premedicated      Lamictal [Lamotrigine]     Skin rash, likely not SJS but concerning   Celebrex [Celecoxib] Other (See Comments)    Chest pain   Spironolactone Other (See Comments)    Bleeding    Immunization History  Administered Date(s) Administered   Influenza Inj Mdck Quad With Preservative 09/29/2017   Influenza Split 05/01/2018   Influenza, Seasonal, Injecte, Preservative Fre 09/29/2017   PPD Test 10/01/2018   Tdap 06/01/2017    Past Medical History:  Diagnosis Date   Anemia    Anxiety    Bronchitis    Depression    GERD (gastroesophageal reflux disease)    Gestational diabetes    Iritis    PCOS (polycystic ovarian syndrome)    RA (rheumatoid arthritis) ([place])     Tobacco History: Social History   Tobacco Use  Smoking Status Never  Smokeless Tobacco Never   Counseling given: Not Answered   Outpatient Medications Prior to Visit  Medication Sig Dispense Refill   albuterol (VENTOLIN HFA) 108 (90 Base) MCG/ACT inhaler Inhale into the lungs.     amLODipine (NORVASC) 5 MG tablet Take by mouth.     benzonatate (TESSALON) 100 MG capsule Take 100-200 mg by mouth 3 (three) times daily as needed.     Cholecalciferol 1.25 MG (50000 UT) capsule Take 1 capsule by mouth once a week.     doxepin (SINEQUAN) 50 MG capsule Take 1 capsule (50 mg  total) by mouth at bedtime. 30 capsule 2   methocarbamol (ROBAXIN) 500 MG tablet Take 500 mg by mouth 4 (four) times daily.     omeprazole (PRILOSEC) 40 MG capsule TAKE 1 CAPSULE(40 MG) BY MOUTH IN THE MORNING AND AT BEDTIME 60 capsule 11   promethazine-dextromethorphan (PROMETHAZINE-DM) 6.25-15 MG/5ML syrup Take 5 mLs by mouth 4 (four) times daily as needed.     rosuvastatin (CRESTOR) 20 MG tablet      sertraline (ZOLOFT) 50 MG tablet Take one tab daily 30 tablet 2   predniSONE (DELTASONE) 10 MG tablet 6-5-4-3-2-1 daily - take the pills for that day  together in the am with food     abatacept (ORENCIA) 250 MG injection Inject into the vein.     acetaminophen (TYLENOL) 500 MG tablet Take 2 tablets (1,000 mg total) by mouth every 8 (eight) hours as needed for moderate pain. (Patient not taking: Reported on 05/25/2022) 30 tablet 0   ARIPiprazole (ABILIFY) 2 MG tablet Take 1 tablet (2 mg total) by mouth daily. (Patient not taking: Reported on 05/25/2022) 30 tablet 2   traMADol (ULTRAM) 50 MG tablet Take 1 tablet by mouth every 6 (six) hours. (Patient not taking: Reported on 05/25/2022)     No facility-administered medications prior to visit.     Review of Systems:   Constitutional: No weight loss or gain, night sweats, fevers, chills, or lassitude. +fatigue  HEENT: No headaches, difficulty swallowing, tooth/dental problems, or sore throat. No sneezing, itching, ear ache, nasal congestion, or post nasal drip CV:  No chest pain, orthopnea, PND, swelling in lower extremities, anasarca, dizziness, palpitations, syncope Resp: +shortness of breath with exertion; productive cough; wheezing. No hemoptysis. No chest wall deformity GI:  No heartburn, indigestion, abdominal pain, nausea, vomiting, diarrhea, change in bowel habits, loss of appetite, bloody stools.  Skin: No rash, lesions, ulcerations MSK:  No joint pain or swelling.  No decreased range of motion.  No back pain. Neuro: No dizziness or lightheadedness.  Psych: No depression or anxiety. Mood stable.     Physical Exam:  BP (!) 140/84   Pulse 96   Ht '5\' 2"'$  (1.575 m)   Wt 215 lb 12.8 oz (97.9 kg)   LMP 03/20/2020   SpO2 97%   BMI 39.47 kg/m   GEN: Pleasant, interactive, acutely-ill appearing; non-toxi and in no acute distress HEENT:  Normocephalic and atraumatic. PERRLA. Sclera injected. Nasal turbinates erythematous, moist and patent bilaterally. Clear rhinorrhea present. Oropharynx erythematous and moist, without exudate or edema. No lesions, ulcerations NECK:  Supple w/ fair  ROM. No JVD present. Normal carotid impulses w/o bruits. Thyroid symmetrical with no goiter or nodules palpated. Cervical lymphadenopathy.   CV: RRR, no m/r/g, no peripheral edema. Pulses intact, +2 bilaterally. No cyanosis, pallor or clubbing. PULMONARY:  Unlabored, regular breathing. Scattered expiratory wheezes bilaterally A&P. No accessory muscle use.  GI: BS present and normoactive. Soft, non-tender to palpation. No organomegaly or masses detected. MSK: No erythema, warmth or tenderness. No deformities or joint swelling noted.  Neuro: A/Ox3. No focal deficits noted.   Skin: Warm, no lesions or rashe Psych: Normal affect and behavior. Judgement and thought content appropriate.     Lab Results:  CBC    Component Value Date/Time   WBC 9.7 05/19/2022 2147   RBC 5.04 05/19/2022 2147   HGB 14.0 05/19/2022 2147   HCT 44.3 05/19/2022 2147   PLT 289 05/19/2022 2147   MCV 87.9 05/19/2022 2147   MCH 27.8 05/19/2022 2147  MCHC 31.6 05/19/2022 2147   RDW 13.6 05/19/2022 2147   LYMPHSABS 3.0 05/19/2022 2147   MONOABS 0.6 05/19/2022 2147   EOSABS 0.6 (H) 05/19/2022 2147   BASOSABS 0.1 05/19/2022 2147    BMET    Component Value Date/Time   NA 139 05/19/2022 2147   K 3.7 05/19/2022 2147   CL 109 05/19/2022 2147   CO2 25 05/19/2022 2147   GLUCOSE 112 (H) 05/19/2022 2147   BUN 12 05/19/2022 2147   CREATININE 0.74 05/19/2022 2147   CALCIUM 9.1 05/19/2022 2147   GFRNONAA >60 05/19/2022 2147   GFRAA >60 01/18/2019 0411    BNP    Component Value Date/Time   BNP 34.2 01/12/2019 1855     Imaging:  DG Chest 2 View  Result Date: 05/19/2022 CLINICAL DATA:  Cough and wheezing EXAM: CHEST - 2 VIEW COMPARISON:  None 06/2020 FINDINGS: The heart size and mediastinal contours are within normal limits. Minimal scarring at the lingula. The visualized skeletal structures are unremarkable. IMPRESSION: No active cardiopulmonary disease. Electronically Signed   By: Donavan Foil M.D.   On:  05/19/2022 21:53         Latest Ref Rng & Units 04/26/2019   12:54 PM  PFT Results  FVC-Pre L 2.68  P  FVC-Predicted Pre % 88  P  FVC-Post L 2.80  P  FVC-Predicted Post % 92  P  Pre FEV1/FVC % % 85  P  Post FEV1/FCV % % 87  P  FEV1-Pre L 2.28  P  FEV1-Predicted Pre % 92  P  FEV1-Post L 2.44  P  DLCO uncorrected ml/min/mmHg 19.56  P  DLCO UNC% % 90  P  DLVA Predicted % 116  P  TLC L 4.15  P  TLC % Predicted % 82  P  RV % Predicted % 85  P    P Preliminary result    No results found for: "NITRICOXIDE"      Assessment & Plan:   Acute bronchitis with bronchospasm Acute bronchitis related to recent URI. Bronchospasm on exam today. Non-toxic and stable VS. Recent CXR from 10/19 without superimposed infection. Depo inj 80 mg x 1. Recommended extending prednisone taper. Start ICS/LABA; can transition to PRN once symptoms resolve. She has been appropriately covered from an antimicrobial standpoint with augmentin.   Patient Instructions  Continue Albuterol inhaler 2 puffs every 6 hours as needed for shortness of breath or wheezing. Notify if symptoms persist despite rescue inhaler/neb use.  Continue phenergan DM 5 mL every 6 hours as needed for cough Restart astelin nasal spray as directed for nasal congestion  Prednisone taper. 4 tabs for 3 days, then 3 tabs for 3 days, 2 tabs for 3 days, then 1 tab for 3 days, then stop. Take in AM with food. Start tomorrow Benzonatate 1 capsule Three times a day for cough  Symbicort 2 puffs Twice daily for the next two weeks then can use as needed for shortness of breath or wheezing. Brush tongue and rinse mouth afterwards Mucinex 600 mg Twice daily for congestion   Follow up in 2 weeks with Dr. Lamonte Sakai or Alanson Aly. If symptoms do not improve or worsen, please contact office for sooner follow up or seek emergency care.      URI (upper respiratory infection) Recent URI; treated for sinusitis with improvement in symptoms, now with  unresolved cough. See above plan. Advised she restart nasal spray to target postnasal drainage and limit further upper airway irritation. Supportive care  advised.   Sarcoid Appears stable on imaging. See above plan.   I spent 35 minutes of dedicated to the care of this patient on the date of this encounter to include pre-visit review of records, face-to-face time with the patient discussing conditions above, post visit ordering of testing, clinical documentation with the electronic health record, making appropriate referrals as documented, and communicating necessary findings to members of the patients care team.  Clayton Bibles, NP 05/25/2022  Pt aware and understands NP's role.

## 2022-05-25 NOTE — Assessment & Plan Note (Signed)
Acute bronchitis related to recent URI. Bronchospasm on exam today. Non-toxic and stable VS. Recent CXR from 10/19 without superimposed infection. Depo inj 80 mg x 1. Recommended extending prednisone taper. Start ICS/LABA; can transition to PRN once symptoms resolve. She has been appropriately covered from an antimicrobial standpoint with augmentin.   Patient Instructions  Continue Albuterol inhaler 2 puffs every 6 hours as needed for shortness of breath or wheezing. Notify if symptoms persist despite rescue inhaler/neb use.  Continue phenergan DM 5 mL every 6 hours as needed for cough Restart astelin nasal spray as directed for nasal congestion  Prednisone taper. 4 tabs for 3 days, then 3 tabs for 3 days, 2 tabs for 3 days, then 1 tab for 3 days, then stop. Take in AM with food. Start tomorrow Benzonatate 1 capsule Three times a day for cough  Symbicort 2 puffs Twice daily for the next two weeks then can use as needed for shortness of breath or wheezing. Brush tongue and rinse mouth afterwards Mucinex 600 mg Twice daily for congestion   Follow up in 2 weeks with Dr. Lamonte Sakai or Alanson Aly. If symptoms do not improve or worsen, please contact office for sooner follow up or seek emergency care.

## 2022-05-26 MED ORDER — METHYLPREDNISOLONE ACETATE 80 MG/ML IJ SUSP
80.0000 mg | Freq: Once | INTRAMUSCULAR | Status: AC
  Administered 2022-05-25: 80 mg via INTRAMUSCULAR

## 2022-05-26 NOTE — Addendum Note (Signed)
Addended by: Priscille Kluver on: 05/26/2022 01:52 PM   Modules accepted: Orders

## 2022-06-02 ENCOUNTER — Other Ambulatory Visit (HOSPITAL_COMMUNITY): Payer: Self-pay | Admitting: Psychiatry

## 2022-06-02 DIAGNOSIS — F330 Major depressive disorder, recurrent, mild: Secondary | ICD-10-CM

## 2022-06-02 DIAGNOSIS — F419 Anxiety disorder, unspecified: Secondary | ICD-10-CM

## 2022-06-03 ENCOUNTER — Telehealth: Payer: Self-pay | Admitting: Nurse Practitioner

## 2022-06-03 NOTE — Telephone Encounter (Signed)
Called and left voicemail for patient to call office back in regards to flu shot

## 2022-06-08 ENCOUNTER — Ambulatory Visit (INDEPENDENT_AMBULATORY_CARE_PROVIDER_SITE_OTHER): Payer: Medicaid Other | Admitting: Nurse Practitioner

## 2022-06-08 ENCOUNTER — Ambulatory Visit (INDEPENDENT_AMBULATORY_CARE_PROVIDER_SITE_OTHER): Payer: Medicaid Other

## 2022-06-08 ENCOUNTER — Encounter: Payer: Self-pay | Admitting: Nurse Practitioner

## 2022-06-08 VITALS — BP 140/88 | HR 99 | Ht 62.0 in | Wt 213.2 lb

## 2022-06-08 DIAGNOSIS — Z23 Encounter for immunization: Secondary | ICD-10-CM | POA: Diagnosis not present

## 2022-06-08 DIAGNOSIS — D869 Sarcoidosis, unspecified: Secondary | ICD-10-CM

## 2022-06-08 DIAGNOSIS — J209 Acute bronchitis, unspecified: Secondary | ICD-10-CM

## 2022-06-08 MED ORDER — AZITHROMYCIN 250 MG PO TABS: ORAL_TABLET | ORAL | 0 refills | Status: DC

## 2022-06-08 MED ORDER — PREDNISONE 10 MG PO TABS: ORAL_TABLET | ORAL | 0 refills | Status: DC

## 2022-06-08 MED ORDER — FLUTICASONE PROPIONATE 50 MCG/ACT NA SUSP: 2.0000 | Freq: Every day | NASAL | 2 refills | Status: DC

## 2022-06-08 MED ORDER — METHYLPREDNISOLONE ACETATE 80 MG/ML IJ SUSP
120.0000 mg | Freq: Once | INTRAMUSCULAR | Status: AC
  Administered 2022-06-08: 120 mg via INTRAMUSCULAR

## 2022-06-08 MED ORDER — PROMETHAZINE-DM 6.25-15 MG/5ML PO SYRP: 5.0000 mL | ORAL_SOLUTION | Freq: Four times a day (QID) | ORAL | 0 refills | Status: DC | PRN

## 2022-06-08 MED ORDER — BUDESONIDE-FORMOTEROL FUMARATE 160-4.5 MCG/ACT IN AERO: 2.0000 | INHALATION_SPRAY | Freq: Two times a day (BID) | RESPIRATORY_TRACT | 6 refills | Status: DC

## 2022-06-08 MED ORDER — BENZONATATE 200 MG PO CAPS: 200.0000 mg | ORAL_CAPSULE | Freq: Three times a day (TID) | ORAL | 1 refills | Status: DC | PRN

## 2022-06-08 NOTE — Assessment & Plan Note (Signed)
Unresolved. Initially improved but since stopping steroids, she has developed worsening symptoms. Bronchospasm and reactive cough on exam. Suspect some of her cough is related to upper airway irritation. We will treat her with another course of steroids and depo inj today. Empiric azithromycin. Cough control measures encouraged. Target measures to limit further irritation/inflammation. We will recheck CXR today to rule out superimposed infection.   Patient Instructions  Continue Albuterol inhaler 2 puffs every 6 hours as needed for shortness of breath or wheezing. Notify if symptoms persist despite rescue inhaler/neb use.  Continue phenergan DM 5 mL every 6 hours as needed for cough Continue astelin nasal spray as directed for nasal congestion   Prednisone taper. 4 tabs for 3 days, then 3 tabs for 3 days, 2 tabs for 3 days, then 1 tab for 3 days, then stop. Take in AM with food. Start tomorrow Azithromycin 2 tabs on day one then 1 tab daily for four additional days. Take with food. Benzonatate 1 capsule Three times a day for cough  Increase Symbicort to 160 mcg - 2 puffs Twice daily. Brush tongue and rinse mouth afterwards Mucinex 600 mg Twice daily for congestion  Flonase nasal spray 2 sprays each nostril daily Saline nasal irrigation 1-2 times a day  Upper airway cough syndrome: Suppress your cough to allow your larynx (voice box) to heal.  Limit talking for the next few days. Avoid throat clearing. Work on cough suppression with the above recommended suppressants.  Use sugar free hard candies or non-menthol cough drops during this time to soothe your throat.  Warm tea with honey and lemon.    Follow up in 2 weeks with Dr. Lamonte Sakai or Alanson Aly. If symptoms do not improve or worsen, please contact office for sooner follow up or seek emergency care.

## 2022-06-08 NOTE — Patient Instructions (Addendum)
Continue Albuterol inhaler 2 puffs every 6 hours as needed for shortness of breath or wheezing. Notify if symptoms persist despite rescue inhaler/neb use.  Continue phenergan DM 5 mL every 6 hours as needed for cough Continue astelin nasal spray as directed for nasal congestion   Prednisone taper. 4 tabs for 3 days, then 3 tabs for 3 days, 2 tabs for 3 days, then 1 tab for 3 days, then stop. Take in AM with food. Start tomorrow Azithromycin 2 tabs on day one then 1 tab daily for four additional days. Take with food. Benzonatate 1 capsule Three times a day for cough  Increase Symbicort to 160 mcg - 2 puffs Twice daily. Brush tongue and rinse mouth afterwards Mucinex 600 mg Twice daily for congestion  Flonase nasal spray 2 sprays each nostril daily Saline nasal irrigation 1-2 times a day  Upper airway cough syndrome: Suppress your cough to allow your larynx (voice box) to heal.  Limit talking for the next few days. Avoid throat clearing. Work on cough suppression with the above recommended suppressants.  Use sugar free hard candies or non-menthol cough drops during this time to soothe your throat.  Warm tea with honey and lemon.    Follow up in 2 weeks with Dr. Lamonte Sakai or Alanson Aly. If symptoms do not improve or worsen, please contact office for sooner follow up or seek emergency care.

## 2022-06-08 NOTE — Assessment & Plan Note (Signed)
No evidence of active disease; imaging stable.

## 2022-06-08 NOTE — Progress Notes (Signed)
$'@Patient'p$  ID: Anna Lucero, female    DOB: 06-21-1976, 46 y.o.   MRN: [id number]  Chief Complaint  Patient presents with   Follow-up    Pt f/u she reprts she is still not feeling well, symptoms (SOB, cough/wheeze) still occurring. Denies fevers    Referring provider: Mittie Bodo  HPI: 46 year old female, never smoker followed for sarcoidosis. She is a patient of Dr. Agustina Caroli and last seen in office 05/25/2022 by [facility] NP. Past medical history significant for GERD, PCOS, PTSD, obesity. She has rheumatoid arthritis, followed by rheumatology, on Orencia injections.   TEST/EVENTS:  04/26/2019 PFT: FVC 88, FEV1 92, ratio 87, TLC 82, DLCOunc 90. No BD; did have some midflow reversibility  07/19/2019 CT chest wo contrast: Resolved LAD. Residual thickening of the inferior left major fissure.  05/19/2022 CXR: there is minimal scarring in the lingula, unchanged. Otherwise, lungs are clear.   05/25/2022: OV with Elizbeth Posa NP for acute visit. Around two weeks ago, she developed URI and productive cough with yellow sputum. She was evaluated by her PCP on 10/11 and treated for sinusitis with augmentin course and supportive care. She then went to the ED on 10/19 with increased cough and SOB. She left because the wait was too long. Had a televisit with her PCP on 10/20. Started on prednisone and cough syrup. CXR was without any acute process. Treated for Acute bronchitis related to recent URI. Bronchospasm on exam. Depo inj 80 mg x 1. Recommended extending prednisone taper. Start ICS/LABA; can transition to PRN once symptoms resolve. She has been appropriately covered from an antimicrobial standpoint with augmentin.    06/08/2022: Today - follow up Patient presents today for follow up. She was initially feeling better when she was on the higher doses of steroids but over the past few days to a week, she's started coughing more and noticed more wheezing/chest tightness. The cough is paroxysmal at times.  Minimally productive with clear to white sputum. Does get some throat tickle and urge to clear her throat. Breathing feels about the same. Denies any fevers, chills, hemoptysis, headaches, sore throat, leg swelling, orthopnea. No interim sick exposures. She has ran out of tessalon and the cough syrup she was on. Using symbicort Twice daily. Eating and drinking well.   Allergies  Allergen Reactions   Infliximab Itching and Shortness Of Breath    Patient was premedicated   Patient was premedicated      Lamictal [Lamotrigine]     Skin rash, likely not SJS but concerning   Celebrex [Celecoxib] Other (See Comments)    Chest pain   Spironolactone Other (See Comments)    Bleeding    Immunization History  Administered Date(s) Administered   Influenza Inj Mdck Quad With Preservative 09/29/2017   Influenza Split 05/01/2018   Influenza, Seasonal, Injecte, Preservative Fre 09/29/2017   Influenza,inj,Quad PF,6+ Mos 06/08/2022   PPD Test 10/01/2018   Tdap 06/01/2017    Past Medical History:  Diagnosis Date   Anemia    Anxiety    Bronchitis    Depression    GERD (gastroesophageal reflux disease)    Gestational diabetes    Iritis    PCOS (polycystic ovarian syndrome)    RA (rheumatoid arthritis) ([place])     Tobacco History: Social History   Tobacco Use  Smoking Status Never  Smokeless Tobacco Never   Counseling given: Not Answered   Outpatient Medications Prior to Visit  Medication Sig Dispense Refill   acetaminophen (TYLENOL) 500  MG tablet Take 2 tablets (1,000 mg total) by mouth every 8 (eight) hours as needed for moderate pain. 30 tablet 0   albuterol (VENTOLIN HFA) 108 (90 Base) MCG/ACT inhaler Inhale into the lungs.     amLODipine (NORVASC) 5 MG tablet Take by mouth.     Cholecalciferol 1.25 MG (50000 UT) capsule Take 1 capsule by mouth once a week.     doxepin (SINEQUAN) 50 MG capsule Take 1 capsule (50 mg total) by mouth at bedtime. 30 capsule 2   methocarbamol (ROBAXIN)  500 MG tablet Take 500 mg by mouth 4 (four) times daily.     omeprazole (PRILOSEC) 40 MG capsule TAKE 1 CAPSULE(40 MG) BY MOUTH IN THE MORNING AND AT BEDTIME 60 capsule 11   rosuvastatin (CRESTOR) 20 MG tablet      sertraline (ZOLOFT) 50 MG tablet Take one tab daily 30 tablet 2   budesonide-formoterol (SYMBICORT) 80-4.5 MCG/ACT inhaler Inhale 2 puffs into the lungs in the morning and at bedtime. 1 each 5   promethazine-dextromethorphan (PROMETHAZINE-DM) 6.25-15 MG/5ML syrup Take 5 mLs by mouth 4 (four) times daily as needed.     abatacept (ORENCIA) 250 MG injection Inject into the vein.     ARIPiprazole (ABILIFY) 2 MG tablet Take 1 tablet (2 mg total) by mouth daily. (Patient not taking: Reported on 05/25/2022) 30 tablet 2   traMADol (ULTRAM) 50 MG tablet Take 1 tablet by mouth every 6 (six) hours.     benzonatate (TESSALON) 100 MG capsule Take 100-200 mg by mouth 3 (three) times daily as needed. (Patient not taking: Reported on 06/08/2022)     predniSONE (DELTASONE) 10 MG tablet 4 tabs for 3 days, then 3 tabs for 3 days, 2 tabs for 3 days, then 1 tab for 3 days, then stop 30 tablet 0   No facility-administered medications prior to visit.     Review of Systems:   Constitutional: No weight loss or gain, night sweats, fevers, chills, or lassitude. +fatigue  HEENT: No headaches, difficulty swallowing, tooth/dental problems, or sore throat. No sneezing, itching, ear ache, nasal congestion, or post nasal drip CV:  No chest pain, orthopnea, PND, swelling in lower extremities, anasarca, dizziness, palpitations, syncope Resp: +shortness of breath with exertion; minimally productive, paroxysmal cough; wheezing. No hemoptysis. No chest wall deformity GI:  No heartburn, indigestion, abdominal pain, nausea, vomiting, diarrhea, change in bowel habits, loss of appetite, bloody stools.  Skin: No rash, lesions, ulcerations MSK:  No joint pain or swelling.  No decreased range of motion.  No back pain. Neuro:  No dizziness or lightheadedness.  Psych: No depression or anxiety. Mood stable.     Physical Exam:  BP (!) 140/88   Pulse 99   Ht '5\' 2"'$  (1.575 m)   Wt 213 lb 3.2 oz (96.7 kg)   LMP 03/20/2020   SpO2 99%   BMI 38.99 kg/m   GEN: Pleasant, interactive, acutely-ill appearing; non-toxi and in no acute distress HEENT:  Normocephalic and atraumatic. PERRLA. Sclera white. Nasal turbinates erythematous, moist and patent bilaterally. No rhinorrhea present. Oropharynx erythematous and moist, without exudate or edema. No lesions, ulcerations NECK:  Supple w/ fair ROM. No JVD present. Normal carotid impulses w/o bruits. Thyroid symmetrical with no goiter or nodules palpated. No LAD. CV: RRR, no m/r/g, no peripheral edema. Pulses intact, +2 bilaterally. No cyanosis, pallor or clubbing. PULMONARY:  Unlabored, regular breathing. Scattered expiratory wheezes bilaterally A&P. No accessory muscle use.  GI: BS present and normoactive. Soft, non-tender to palpation.  No organomegaly or masses detected. MSK: No erythema, warmth or tenderness. No deformities or joint swelling noted.  Neuro: A/Ox3. No focal deficits noted.   Skin: Warm, no lesions or rashe Psych: Normal affect and behavior. Judgement and thought content appropriate.     Lab Results:  CBC    Component Value Date/Time   WBC 9.7 05/19/2022 2147   RBC 5.04 05/19/2022 2147   HGB 14.0 05/19/2022 2147   HCT 44.3 05/19/2022 2147   PLT 289 05/19/2022 2147   MCV 87.9 05/19/2022 2147   MCH 27.8 05/19/2022 2147   MCHC 31.6 05/19/2022 2147   RDW 13.6 05/19/2022 2147   LYMPHSABS 3.0 05/19/2022 2147   MONOABS 0.6 05/19/2022 2147   EOSABS 0.6 (H) 05/19/2022 2147   BASOSABS 0.1 05/19/2022 2147    BMET    Component Value Date/Time   NA 139 05/19/2022 2147   K 3.7 05/19/2022 2147   CL 109 05/19/2022 2147   CO2 25 05/19/2022 2147   GLUCOSE 112 (H) 05/19/2022 2147   BUN 12 05/19/2022 2147   CREATININE 0.74 05/19/2022 2147   CALCIUM 9.1  05/19/2022 2147   GFRNONAA >60 05/19/2022 2147   GFRAA >60 01/18/2019 0411    BNP    Component Value Date/Time   BNP 34.2 01/12/2019 1855     Imaging:  DG Chest 2 View  Result Date: 06/08/2022 CLINICAL DATA:  Cough. EXAM: CHEST - 2 VIEW COMPARISON:  May 19, 2022. FINDINGS: The heart size and mediastinal contours are within normal limits. Both lungs are clear. The visualized skeletal structures are unremarkable. IMPRESSION: No active cardiopulmonary disease. Electronically Signed   By: Marijo Conception M.D.   On: 06/08/2022 14:24   DG Chest 2 View  Result Date: 05/19/2022 CLINICAL DATA:  Cough and wheezing EXAM: CHEST - 2 VIEW COMPARISON:  None 06/2020 FINDINGS: The heart size and mediastinal contours are within normal limits. Minimal scarring at the lingula. The visualized skeletal structures are unremarkable. IMPRESSION: No active cardiopulmonary disease. Electronically Signed   By: Donavan Foil M.D.   On: 05/19/2022 21:53    methylPREDNISolone acetate (DEPO-MEDROL) injection 80 mg     Date Action Dose Route User   05/25/2022 1030 Given 80 mg Intramuscular (Left Ventrogluteal) Mellody Life E, CMA      methylPREDNISolone acetate (DEPO-MEDROL) injection 120 mg     Date Action Dose Route User   06/08/2022 1356 Given 120 mg Intramuscular (Left Ventrogluteal) Priscille Kluver, CMA          Latest Ref Rng & Units 04/26/2019   12:54 PM  PFT Results  FVC-Pre L 2.68  P  FVC-Predicted Pre % 88  P  FVC-Post L 2.80  P  FVC-Predicted Post % 92  P  Pre FEV1/FVC % % 85  P  Post FEV1/FCV % % 87  P  FEV1-Pre L 2.28  P  FEV1-Predicted Pre % 92  P  FEV1-Post L 2.44  P  DLCO uncorrected ml/min/mmHg 19.56  P  DLCO UNC% % 90  P  DLVA Predicted % 116  P  TLC L 4.15  P  TLC % Predicted % 82  P  RV % Predicted % 85  P    P Preliminary result    No results found for: "NITRICOXIDE"      Assessment & Plan:   Acute bronchitis with bronchospasm Unresolved. Initially improved  but since stopping steroids, she has developed worsening symptoms. Bronchospasm and reactive cough on exam. Suspect some of  her cough is related to upper airway irritation. We will treat her with another course of steroids and depo inj today. Empiric azithromycin. Cough control measures encouraged. Target measures to limit further irritation/inflammation. We will recheck CXR today to rule out superimposed infection.   Patient Instructions  Continue Albuterol inhaler 2 puffs every 6 hours as needed for shortness of breath or wheezing. Notify if symptoms persist despite rescue inhaler/neb use.  Continue phenergan DM 5 mL every 6 hours as needed for cough Continue astelin nasal spray as directed for nasal congestion   Prednisone taper. 4 tabs for 3 days, then 3 tabs for 3 days, 2 tabs for 3 days, then 1 tab for 3 days, then stop. Take in AM with food. Start tomorrow Azithromycin 2 tabs on day one then 1 tab daily for four additional days. Take with food. Benzonatate 1 capsule Three times a day for cough  Increase Symbicort to 160 mcg - 2 puffs Twice daily. Brush tongue and rinse mouth afterwards Mucinex 600 mg Twice daily for congestion  Flonase nasal spray 2 sprays each nostril daily Saline nasal irrigation 1-2 times a day  Upper airway cough syndrome: Suppress your cough to allow your larynx (voice box) to heal.  Limit talking for the next few days. Avoid throat clearing. Work on cough suppression with the above recommended suppressants.  Use sugar free hard candies or non-menthol cough drops during this time to soothe your throat.  Warm tea with honey and lemon.    Follow up in 2 weeks with Dr. Lamonte Sakai or Alanson Aly. If symptoms do not improve or worsen, please contact office for sooner follow up or seek emergency care.     Sarcoid No evidence of active disease; imaging stable.    I spent 35 minutes of dedicated to the care of this patient on the date of this encounter to include pre-visit  review of records, face-to-face time with the patient discussing conditions above, post visit ordering of testing, clinical documentation with the electronic health record, making appropriate referrals as documented, and communicating necessary findings to members of the patients care team.  Clayton Bibles, NP 06/08/2022  Pt aware and understands NP's role.

## 2022-06-10 NOTE — Telephone Encounter (Signed)
Patient was seen by Joellen Jersey on 11/8 and received her flu vaccine. Will close this encounter.

## 2022-06-17 ENCOUNTER — Encounter (HOSPITAL_COMMUNITY): Payer: Self-pay | Admitting: Psychiatry

## 2022-06-17 ENCOUNTER — Telehealth (HOSPITAL_BASED_OUTPATIENT_CLINIC_OR_DEPARTMENT_OTHER): Payer: Medicaid Other | Admitting: Psychiatry

## 2022-06-17 DIAGNOSIS — F419 Anxiety disorder, unspecified: Secondary | ICD-10-CM

## 2022-06-17 DIAGNOSIS — F330 Major depressive disorder, recurrent, mild: Secondary | ICD-10-CM | POA: Diagnosis not present

## 2022-06-17 MED ORDER — SERTRALINE HCL 50 MG PO TABS: ORAL_TABLET | ORAL | 2 refills | Status: DC

## 2022-06-17 MED ORDER — ARIPIPRAZOLE 2 MG PO TABS: 2.0000 mg | ORAL_TABLET | Freq: Every day | ORAL | 2 refills | Status: DC

## 2022-06-17 MED ORDER — DOXEPIN HCL 50 MG PO CAPS: 50.0000 mg | ORAL_CAPSULE | Freq: Every day | ORAL | 2 refills | Status: DC

## 2022-06-17 NOTE — Progress Notes (Signed)
Virtual Visit via Telephone Note  I connected with Anna Lucero on 06/17/22 at 10:20 AM EST by telephone and verified that I am speaking with the correct person using two identifiers.  Location: Patient: Home Provider: Home Office   I discussed the limitations, risks, security and privacy concerns of performing an evaluation and management service by telephone and the availability of in person appointments. I also discussed with the patient that there may be a patient responsible charge related to this service. The patient expressed understanding and agreed to proceed.   History of Present Illness: Patient is evaluated by phone session.  She is well on her current medication.  She started a new job at [employer] in September.  She is working 36 hours at night.  She is happy because she has more benefits.  She feels things are going very well and she is compliant with Abilify, doxepin and Zoloft.  She had blood work 1 month ago and her hemoglobin A1c is 6.4.  She feels Abilify helping her a lot and she has more energy.  She denies any suicidal thoughts or homicidal thoughts.  She denies any crying spells or any feeling of hopelessness or worthlessness.  Her anxiety is much better and symptoms are well-controlled.  She like to keep the current medication.   Psychiatric Specialty Exam: Physical Exam  Review of Systems  Weight 213 lb (96.6 kg), last menstrual period 03/20/2020, unknown if currently breastfeeding.There is no height or weight on file to calculate BMI.  General Appearance: NA  Eye Contact:  NA  Speech:  Clear and Coherent  Volume:  Normal  Mood:  Euthymic  Affect:  NA  Thought Process:  Goal Directed  Orientation:  Full (Time, Place, and Person)  Thought Content:  Logical  Suicidal Thoughts:  No  Homicidal Thoughts:  No  Memory:  Immediate;   Good Recent;   Good Remote;   Good  Judgement:  Intact  Insight:  Present  Psychomotor Activity:  NA   Concentration:  Concentration: Good and Attention Span: Good  Recall:  Good  Fund of Knowledge:  Good  Language:  Good  Akathisia:  No  Handed:  Right  AIMS (if indicated):     Assets:  Communication Skills Desire for Improvement Housing Resilience Social Support Talents/Skills Transportation  ADL's:  Intact  Cognition:  WNL  Sleep:   ok      Assessment and Plan: Major depressive disorder, recurrent.  Anxiety.  Patient is stable on her current medication.  I reviewed blood work results.  Her last hemoglobin A1c 6.4.  Continue Abilify 2 mg daily, Zoloft 50 mg daily and doxepin 50 mg at bedtime.  She is in therapy with Bubba Hales at [facility] counseling.  Recommend to continue.  Discussed medication side effects and benefits.  Follow-up in 3 months.  Follow Up Instructions:    I discussed the assessment and treatment plan with the patient. The patient was provided an opportunity to ask questions and all were answered. The patient agreed with the plan and demonstrated an understanding of the instructions.   The patient was advised to call back or seek an in-person evaluation if the symptoms worsen or if the condition fails to improve as anticipated.  Collaboration of Care: Other provider involved in patient's care AEB notes are available in epic to review.  Patient/Guardian was advised Release of Information must be obtained prior to any record release in order to collaborate their care with an outside  provider. Patient/Guardian was advised if they have not already done so to contact the registration department to sign all necessary forms in order for [place] to release information regarding their care.   Consent: Patient/Guardian gives verbal consent for treatment and assignment of benefits for services provided during this visit. Patient/Guardian expressed understanding and agreed to proceed.    I provided 19 minutes of non-face-to-face time during this encounter.   Kathlee Nations,  MD

## 2022-06-20 ENCOUNTER — Telehealth (HOSPITAL_COMMUNITY): Payer: Self-pay

## 2022-06-20 ENCOUNTER — Telehealth (HOSPITAL_COMMUNITY): Payer: Self-pay | Admitting: *Deleted

## 2022-06-20 NOTE — Telephone Encounter (Signed)
PA FOR ABILIFY 2 MG ORAL TABLET APPROVED BY [employer] COVER MY MEDS.   MEDICATION IS APPROVED FROM 06/20/22 THROUGH 06/21/23.

## 2022-06-20 NOTE — Telephone Encounter (Signed)
Medication management - A prior authorization was submitted online through CoverMyMeds to her [place] [employer] for continued coverage of her prescribe Abilify 2 mg, one a day. Prior authorization approval pending review.

## 2022-06-22 ENCOUNTER — Ambulatory Visit: Payer: Medicaid Other | Admitting: Nurse Practitioner

## 2022-08-30 ENCOUNTER — Telehealth: Payer: Self-pay | Admitting: Nurse Practitioner

## 2022-08-30 NOTE — Telephone Encounter (Signed)
Pt calling for an appt w/Dr. Lamonte Sakai or Ms. Cobb. I had to make an Acute  appt w/a NP other than Ms. Cobb due to Acute. Pt did not want to end up back in ER. Asking if Ms. Cobb can send in a script for a antibx and or Pred.   Wheezing Coughing Congestion  Pls call @ [phone]

## 2022-08-31 MED ORDER — AZITHROMYCIN 250 MG PO TABS: ORAL_TABLET | ORAL | 0 refills | Status: DC

## 2022-08-31 MED ORDER — PREDNISONE 10 MG PO TABS: ORAL_TABLET | ORAL | 0 refills | Status: DC

## 2022-08-31 NOTE — Telephone Encounter (Signed)
Please send z pack and prednisone taper (10 mg tab) 4 tabs for 2 days, then 3 tabs for 2 days, 2 tabs for 2 days, then 1 tab for 2 days, then stop. Take in AM with food. Use delsym or mucinex DM or robitussin DM over the counter for cough. Can continue tessalon perles. Needs to keep follow up appt on 2/7 with Beth, unless I have openings. If she develops fevers, chills, shortness of breath or worsening symptoms, needs to be evaluated at ED or UC. Thanks.

## 2022-08-31 NOTE — Telephone Encounter (Signed)
PT calling back. Pls try again.

## 2022-08-31 NOTE — Telephone Encounter (Signed)
Spoke with pt and reviewed Katie's recommendations and medication instructions. Pt verified pharmacy and states understanding. Orders placed. Nothing further needed at this time.

## 2022-08-31 NOTE — Telephone Encounter (Signed)
LMOM for Pt to return call so I can find out about her symptoms.

## 2022-08-31 NOTE — Telephone Encounter (Signed)
Still waiting on a response from [facility].  Routing back to her for review as pt is calling back.

## 2022-09-06 NOTE — Progress Notes (Deleted)
$'@Patient'B$  ID: Anna Lucero, female    DOB: [birth year], 47 y.o.   MRN: [id number]  No chief complaint on file.   Referring provider: Mittie Bodo  HPI: 47 year old female, never smoked.  Past medical history significant for sarcoid, bronchitis with bronchospasm, GERD, polycystic ovaries, obesity type 2, obesity. Patient of Dr. Lamonte Sakai, last seen by pulmonary NP  on 06/08/22 for acute bronchitis.   09/07/2022 Patient presents today for acute visit.   She has had chronic cough for several years. She saw Dr. Lamonte Sakai in October 2021. She had CXR in September 2021 was reassuring. She is off Dulera. She is on methotrexate and remicade.   Cough responds to prednisone Jerrye Bushy- ? PPI CXR/ ACE level     Allergies  Allergen Reactions   Infliximab Itching and Shortness Of Breath    Patient was premedicated   Patient was premedicated      Lamictal [Lamotrigine]     Skin rash, likely not SJS but concerning   Celebrex [Celecoxib] Other (See Comments)    Chest pain   Spironolactone Other (See Comments)    Bleeding    Immunization History  Administered Date(s) Administered   Influenza Inj Mdck Quad With Preservative 09/29/2017   Influenza Split 05/01/2018   Influenza, Seasonal, Injecte, Preservative Fre 09/29/2017   Influenza,inj,Quad PF,6+ Mos 06/08/2022   Moderna Sars-Covid-2 Vaccination 05/22/2020, 06/24/2020   PPD Test 10/01/2018   Tdap 06/01/2017    Past Medical History:  Diagnosis Date   Anemia    Anxiety    Bronchitis    Depression    GERD (gastroesophageal reflux disease)    Gestational diabetes    Iritis    PCOS (polycystic ovarian syndrome)    RA (rheumatoid arthritis) ([place])     Tobacco History: Social History   Tobacco Use  Smoking Status Never  Smokeless Tobacco Never   Counseling given: Not Answered   Outpatient Medications Prior to Visit  Medication Sig Dispense Refill   azithromycin (ZITHROMAX Z-PAK) 250 MG tablet Take 2 tabs today, then 1 tab  until gone 6 each 0   predniSONE (DELTASONE) 10 MG tablet Take 4 tabs for 2 days, then 3 tabs for 2 days, 2 tabs for 2 days, then 1 tab for 2 days, then stop. 20 tablet 0   abatacept (ORENCIA) 250 MG injection Inject into the vein.     acetaminophen (TYLENOL) 500 MG tablet Take 2 tablets (1,000 mg total) by mouth every 8 (eight) hours as needed for moderate pain. 30 tablet 0   albuterol (VENTOLIN HFA) 108 (90 Base) MCG/ACT inhaler Inhale into the lungs.     amLODipine (NORVASC) 5 MG tablet Take by mouth.     ARIPiprazole (ABILIFY) 2 MG tablet Take 1 tablet (2 mg total) by mouth daily. 30 tablet 2   azithromycin (ZITHROMAX) 250 MG tablet Take 2 tablets on day one then 1 tablet daily for four additional days 6 tablet 0   benzonatate (TESSALON) 200 MG capsule Take 1 capsule (200 mg total) by mouth 3 (three) times daily as needed for cough. 30 capsule 1   budesonide-formoterol (SYMBICORT) 160-4.5 MCG/ACT inhaler Inhale 2 puffs into the lungs in the morning and at bedtime. 1 each 6   Cholecalciferol 1.25 MG (50000 UT) capsule Take 1 capsule by mouth once a week.     doxepin (SINEQUAN) 50 MG capsule Take 1 capsule (50 mg total) by mouth at bedtime. 30 capsule 2   fluticasone (FLONASE) 50 MCG/ACT nasal spray Place  2 sprays into both nostrils daily. 18.2 mL 2   methocarbamol (ROBAXIN) 500 MG tablet Take 500 mg by mouth 4 (four) times daily.     omeprazole (PRILOSEC) 40 MG capsule TAKE 1 CAPSULE(40 MG) BY MOUTH IN THE MORNING AND AT BEDTIME 60 capsule 11   predniSONE (DELTASONE) 10 MG tablet 4 tabs for 3 days, then 3 tabs for 3 days, 2 tabs for 3 days, then 1 tab for 3 days, then stop 30 tablet 0   promethazine-dextromethorphan (PROMETHAZINE-DM) 6.25-15 MG/5ML syrup Take 5 mLs by mouth 4 (four) times daily as needed for cough. 180 mL 0   rosuvastatin (CRESTOR) 20 MG tablet      sertraline (ZOLOFT) 50 MG tablet Take one tab daily 30 tablet 2   traMADol (ULTRAM) 50 MG tablet Take 1 tablet by mouth every 6  (six) hours.     No facility-administered medications prior to visit.      Review of Systems  Review of Systems   Physical Exam  LMP 03/20/2020  Physical Exam   Lab Results:  CBC    Component Value Date/Time   WBC 9.7 05/19/2022 2147   RBC 5.04 05/19/2022 2147   HGB 14.0 05/19/2022 2147   HCT 44.3 05/19/2022 2147   PLT 289 05/19/2022 2147   MCV 87.9 05/19/2022 2147   MCH 27.8 05/19/2022 2147   MCHC 31.6 05/19/2022 2147   RDW 13.6 05/19/2022 2147   LYMPHSABS 3.0 05/19/2022 2147   MONOABS 0.6 05/19/2022 2147   EOSABS 0.6 (H) 05/19/2022 2147   BASOSABS 0.1 05/19/2022 2147    BMET    Component Value Date/Time   NA 139 05/19/2022 2147   K 3.7 05/19/2022 2147   CL 109 05/19/2022 2147   CO2 25 05/19/2022 2147   GLUCOSE 112 (H) 05/19/2022 2147   BUN 12 05/19/2022 2147   CREATININE 0.74 05/19/2022 2147   CALCIUM 9.1 05/19/2022 2147   GFRNONAA >60 05/19/2022 2147   GFRAA >60 01/18/2019 0411    BNP    Component Value Date/Time   BNP 34.2 01/12/2019 1855    ProBNP No results found for: "PROBNP"  Imaging: No results found.   Assessment & Plan:   No problem-specific Assessment & Plan notes found for this encounter.     Martyn Ehrich, NP 09/06/2022

## 2022-09-07 ENCOUNTER — Ambulatory Visit: Payer: Medicaid Other | Admitting: Primary Care

## 2022-09-16 ENCOUNTER — Telehealth (HOSPITAL_COMMUNITY): Payer: Medicaid Other | Admitting: Psychiatry

## 2022-10-06 ENCOUNTER — Ambulatory Visit (INDEPENDENT_AMBULATORY_CARE_PROVIDER_SITE_OTHER): Payer: No Typology Code available for payment source

## 2022-10-06 ENCOUNTER — Ambulatory Visit (INDEPENDENT_AMBULATORY_CARE_PROVIDER_SITE_OTHER): Payer: No Typology Code available for payment source | Admitting: Nurse Practitioner

## 2022-10-06 ENCOUNTER — Encounter: Payer: Self-pay | Admitting: Nurse Practitioner

## 2022-10-06 ENCOUNTER — Ambulatory Visit: Payer: Medicaid Other | Admitting: Nurse Practitioner

## 2022-10-06 VITALS — BP 138/82 | HR 104 | Temp 98.6°F | Ht 62.0 in | Wt 217.0 lb

## 2022-10-06 DIAGNOSIS — J4550 Severe persistent asthma, uncomplicated: Secondary | ICD-10-CM | POA: Insufficient documentation

## 2022-10-06 DIAGNOSIS — J4541 Moderate persistent asthma with (acute) exacerbation: Secondary | ICD-10-CM | POA: Diagnosis not present

## 2022-10-06 DIAGNOSIS — D869 Sarcoidosis, unspecified: Secondary | ICD-10-CM

## 2022-10-06 DIAGNOSIS — R058 Other specified cough: Secondary | ICD-10-CM | POA: Diagnosis not present

## 2022-10-06 DIAGNOSIS — J4540 Moderate persistent asthma, uncomplicated: Secondary | ICD-10-CM | POA: Insufficient documentation

## 2022-10-06 MED ORDER — PREDNISONE 10 MG PO TABS: ORAL_TABLET | ORAL | 0 refills | Status: DC

## 2022-10-06 MED ORDER — BENZONATATE 200 MG PO CAPS: 200.0000 mg | ORAL_CAPSULE | Freq: Three times a day (TID) | ORAL | 1 refills | Status: AC | PRN

## 2022-10-06 NOTE — Patient Instructions (Addendum)
Continue Albuterol inhaler 2 puffs every 6 hours as needed for shortness of breath or wheezing. Notify if symptoms persist despite rescue inhaler/neb use.  Restart astelin nasal spray as directed for nasal congestion Restart flonase nasal spray 2 sprays each nostril daily  Continue promethazine DM cough syrup 5 mL every 6 hours as needed for coughing. Do not drive after taking. May cause drowsiness    Prednisone taper. 4 tabs for 3 days, then 3 tabs for 3 days, 2 tabs for 3 days, then 1 tab for 3 days, then stop. Take in AM with food.  Benzonatate 1 capsule Three times a day for cough. Use consistently over the next 3-4 days Restart Symbicort to 160 mcg - 2 puffs Twice daily. Brush tongue and rinse mouth afterwards Mucinex 600 mg Twice daily for congestion    Upper airway cough syndrome: Suppress your cough to allow your larynx (voice box) to heal.  Limit talking for the next few days. Avoid throat clearing. Work on cough suppression with the above recommended suppressants.  Use sugar free hard candies or non-menthol cough drops during this time to soothe your throat.  Warm tea with honey and lemon.   Chest x ray today    Follow up in 2 weeks with Dr. Lamonte Sakai or Alanson Aly. If symptoms do not improve or worsen, please contact office for sooner follow up or seek emergency care

## 2022-10-06 NOTE — Assessment & Plan Note (Signed)
No systemic symptoms and no infiltrates on imaging. See above.

## 2022-10-06 NOTE — Progress Notes (Signed)
$'@Patient'Q$  ID: Anna Lucero, female    DOB: August 31, 1975, 47 y.o.   MRN: [id number]  Chief Complaint  Patient presents with   Follow-up    Wheezing      Referring provider: Mittie Bodo  HPI: 47 year old female, never smoker followed for sarcoidosis. She is a patient of Dr. Agustina Caroli and last seen in office 06/08/2022 by Belenda Cruise NP. Past medical history significant for GERD, PCOS, PTSD, obesity. She has rheumatoid arthritis, followed by rheumatology, on Orencia injections.   TEST/EVENTS:  04/26/2019 PFT: FVC 88, FEV1 92, ratio 87, TLC 82, DLCOunc 90. No BD; did have some midflow reversibility  07/19/2019 CT chest wo contrast: Resolved LAD. Residual thickening of the inferior left major fissure.  05/19/2022 CXR: there is minimal scarring in the lingula, unchanged. Otherwise, lungs are clear.   05/25/2022: OV with Gurjit Loconte NP for acute visit. Around two weeks ago, she developed URI and productive cough with yellow sputum. She was evaluated by her PCP on 10/11 and treated for sinusitis with augmentin course and supportive care. She then went to the ED on 10/19 with increased cough and SOB. She left because the wait was too long. Had a televisit with her PCP on 10/20. Started on prednisone and cough syrup. CXR was without any acute process. Treated for Acute bronchitis related to recent URI. Bronchospasm on exam. Depo inj 80 mg x 1. Recommended extending prednisone taper. Start ICS/LABA; can transition to PRN once symptoms resolve. She has been appropriately covered from an antimicrobial standpoint with augmentin.    06/08/2022: OV with Matisse Roskelley NP for follow up. She was initially feeling better when she was on the higher doses of steroids but over the past few days to a week, she's started coughing more and noticed more wheezing/chest tightness. The cough is paroxysmal at times. Minimally productive with clear to white sputum. Does get some throat tickle and urge to clear her throat. Breathing feels  about the same. Denies any fevers, chills, hemoptysis, headaches, sore throat, leg swelling, orthopnea. No interim sick exposures. She has ran out of tessalon and the cough syrup she was on. Using symbicort Twice daily. Eating and drinking well.   10/07/2022: Today - acute Patient presents today for acute visit. She was treated with prednisone taper, cough control measures and started on high dose Symbicort at our last visit. Her cough improved and she was feeling better. Not noticing much wheezing up until the end of January. She called back in on 1/30 with reports of productive cough and wheezing for a week. She was treated for bronchitis with z pack and prednisone taper. Advised on cough control regimen. Advised to keep follow up appt 2/7, which she ended up having to reschedule. Today, she tells me that she was feeling better after the steroids/abx, but over the past 2-3 weeks, she has started coughing again and wheezing more. Cough is occasionally productive with white sputum but usually dry. She feels slightly more short winded, mainly with coughing spells. She has chest tightness. Mild nasal congestion but no significant drainage. She denies any interim sick exposures. Denies fevers, chills, hemoptysis, leg swelling, night sweats, orthopnea. No skin lesions or visual changes. She stopped using the Symbicort sometime early January because she was feeling better. Receives benefit from albuterol. She is still using promethazine DM cough syrup at night, which helps calm her cough so she can sleep.   Allergies  Allergen Reactions   Infliximab Itching and Shortness Of Breath  Patient was premedicated   Patient was premedicated      Lamictal [Lamotrigine]     Skin rash, likely not SJS but concerning   Celebrex [Celecoxib] Other (See Comments)    Chest pain   Hydrocodone-Acetaminophen Other (See Comments)   Spironolactone Other (See Comments)    Bleeding    Immunization History  Administered  Date(s) Administered   Influenza Inj Mdck Quad With Preservative 09/29/2017   Influenza Split 05/01/2018   Influenza, Seasonal, Injecte, Preservative Fre 09/29/2017   Influenza,inj,Quad PF,6+ Mos 06/08/2022   Moderna Sars-Covid-2 Vaccination 05/22/2020, 06/24/2020   PPD Test 10/01/2018   Tdap 06/01/2017    Past Medical History:  Diagnosis Date   Anemia    Anxiety    Bronchitis    Depression    GERD (gastroesophageal reflux disease)    Gestational diabetes    Iritis    PCOS (polycystic ovarian syndrome)    RA (rheumatoid arthritis) ([place])     Tobacco History: Social History   Tobacco Use  Smoking Status Never  Smokeless Tobacco Never   Counseling given: Not Answered   Outpatient Medications Prior to Visit  Medication Sig Dispense Refill   amLODipine (NORVASC) 5 MG tablet Take by mouth.     Ascorbic Acid (VITAMIN C) 100 MG tablet Take 100 mg by mouth daily.     doxepin (SINEQUAN) 50 MG capsule Take 1 capsule (50 mg total) by mouth at bedtime. 30 capsule 2   methocarbamol (ROBAXIN) 500 MG tablet Take 500 mg by mouth 4 (four) times daily.     omeprazole (PRILOSEC) 40 MG capsule TAKE 1 CAPSULE(40 MG) BY MOUTH IN THE MORNING AND AT BEDTIME 60 capsule 11   rosuvastatin (CRESTOR) 20 MG tablet      sertraline (ZOLOFT) 50 MG tablet Take one tab daily 30 tablet 2   abatacept (ORENCIA) 250 MG injection Inject into the vein.     acetaminophen (TYLENOL) 500 MG tablet Take 2 tablets (1,000 mg total) by mouth every 8 (eight) hours as needed for moderate pain. (Patient not taking: Reported on 10/06/2022) 30 tablet 0   albuterol (VENTOLIN HFA) 108 (90 Base) MCG/ACT inhaler Inhale into the lungs. (Patient not taking: Reported on 10/06/2022)     ARIPiprazole (ABILIFY) 2 MG tablet Take 1 tablet (2 mg total) by mouth daily. (Patient not taking: Reported on 10/06/2022) 30 tablet 2   azithromycin (ZITHROMAX Z-PAK) 250 MG tablet Take 2 tabs today, then 1 tab until gone (Patient not taking: Reported on  10/06/2022) 6 each 0   azithromycin (ZITHROMAX) 250 MG tablet Take 2 tablets on day one then 1 tablet daily for four additional days (Patient not taking: Reported on 10/06/2022) 6 tablet 0   budesonide-formoterol (SYMBICORT) 160-4.5 MCG/ACT inhaler Inhale 2 puffs into the lungs in the morning and at bedtime. (Patient not taking: Reported on 10/06/2022) 1 each 6   Cholecalciferol 1.25 MG (50000 UT) capsule Take 1 capsule by mouth once a week. (Patient not taking: Reported on 10/06/2022)     fluticasone (FLONASE) 50 MCG/ACT nasal spray Place 2 sprays into both nostrils daily. (Patient not taking: Reported on 10/06/2022) 18.2 mL 2   promethazine-dextromethorphan (PROMETHAZINE-DM) 6.25-15 MG/5ML syrup Take 5 mLs by mouth 4 (four) times daily as needed for cough. (Patient not taking: Reported on 10/06/2022) 180 mL 0   benzonatate (TESSALON) 200 MG capsule Take 1 capsule (200 mg total) by mouth 3 (three) times daily as needed for cough. (Patient not taking: Reported on 10/06/2022) 30 capsule 1  predniSONE (DELTASONE) 10 MG tablet 4 tabs for 3 days, then 3 tabs for 3 days, 2 tabs for 3 days, then 1 tab for 3 days, then stop (Patient not taking: Reported on 10/06/2022) 30 tablet 0   predniSONE (DELTASONE) 10 MG tablet Take 4 tabs for 2 days, then 3 tabs for 2 days, 2 tabs for 2 days, then 1 tab for 2 days, then stop. (Patient not taking: Reported on 10/06/2022) 20 tablet 0   No facility-administered medications prior to visit.     Review of Systems:   Constitutional: No weight loss or gain, night sweats, fevers, chills, or lassitude. +fatigue  HEENT: No headaches, difficulty swallowing, tooth/dental problems, or sore throat. No sneezing, itching, ear ache, or post nasal drip. +minimal nasal congestion CV:  No chest pain, orthopnea, PND, swelling in lower extremities, anasarca, dizziness, palpitations, syncope Resp: +shortness of breath with exertion; minimally productive, paroxysmal cough; wheezing; chest tightness. No  hemoptysis. No chest wall deformity GI:  No heartburn, indigestion, abdominal pain, nausea, vomiting, diarrhea, change in bowel habits, loss of appetite, bloody stools.  Skin: No rash, lesions, ulcerations MSK:  No joint pain or swelling.  No decreased range of motion.  No back pain. Neuro: No dizziness or lightheadedness.  Psych: No depression or anxiety. Mood stable.     Physical Exam:  BP 138/82 (BP Location: Left Arm, Patient Position: Sitting, Cuff Size: Normal)   Pulse (!) 104   Temp 98.6 F (37 C) (Oral)   Ht '5\' 2"'$  (1.575 m)   Wt 217 lb (98.4 kg)   LMP 03/20/2020   SpO2 99%   BMI 39.69 kg/m   GEN: Pleasant, interactive, acutely-ill appearing; non-toxi and in no acute distress HEENT:  Normocephalic and atraumatic. PERRLA. Sclera white. Nasal turbinates erythematous, moist and patent bilaterally. No rhinorrhea present. Oropharynx erythematous and moist, without exudate or edema. No lesions, ulcerations NECK:  Supple w/ fair ROM. No JVD present. Normal carotid impulses w/o bruits. Thyroid symmetrical with no goiter or nodules palpated. No LAD. CV: RRR, no m/r/g, no peripheral edema. Pulses intact, +2 bilaterally. No cyanosis, pallor or clubbing. PULMONARY:  Unlabored, regular breathing. Scattered wheezes bilaterally A&P. No accessory muscle use.  GI: BS present and normoactive. Soft, non-tender to palpation. No organomegaly or masses detected. MSK: No erythema, warmth or tenderness. No deformities or joint swelling noted.  Neuro: A/Ox3. No focal deficits noted.   Skin: Warm, no lesions or rashe Psych: Normal affect and behavior. Judgement and thought content appropriate.     Lab Results:  CBC    Component Value Date/Time   WBC 9.7 05/19/2022 2147   RBC 5.04 05/19/2022 2147   HGB 14.0 05/19/2022 2147   HCT 44.3 05/19/2022 2147   PLT 289 05/19/2022 2147   MCV 87.9 05/19/2022 2147   MCH 27.8 05/19/2022 2147   MCHC 31.6 05/19/2022 2147   RDW 13.6 05/19/2022 2147    LYMPHSABS 3.0 05/19/2022 2147   MONOABS 0.6 05/19/2022 2147   EOSABS 0.6 (H) 05/19/2022 2147   BASOSABS 0.1 05/19/2022 2147    BMET    Component Value Date/Time   NA 139 05/19/2022 2147   K 3.7 05/19/2022 2147   CL 109 05/19/2022 2147   CO2 25 05/19/2022 2147   GLUCOSE 112 (H) 05/19/2022 2147   BUN 12 05/19/2022 2147   CREATININE 0.74 05/19/2022 2147   CALCIUM 9.1 05/19/2022 2147   GFRNONAA >60 05/19/2022 2147   GFRAA >60 01/18/2019 0411    BNP  Component Value Date/Time   BNP 34.2 01/12/2019 1855     Imaging:  DG Chest 2 View  Result Date: 10/06/2022 CLINICAL DATA:  Sarcoid. Productive cough. Sarcoid versus asthma flare. EXAM: CHEST - 2 VIEW COMPARISON:  Most recent radiograph 06/08/2022 FINDINGS: The heart is normal in size. Stable mediastinal contours. Prominent left epicardial fat pad. Mild peribronchial thickening. No focal airspace disease. No pleural effusion or pneumothorax. Normal pulmonary vasculature. No acute osseous findings. IMPRESSION: Mild peribronchial thickening, can be seen with bronchitis or asthma. Electronically Signed   By: Keith Rake M.D.   On: 10/06/2022 16:48          Latest Ref Rng & Units 04/26/2019   12:54 PM  PFT Results  FVC-Pre L 2.68  P  FVC-Predicted Pre % 88  P  FVC-Post L 2.80  P  FVC-Predicted Post % 92  P  Pre FEV1/FVC % % 85  P  Post FEV1/FCV % % 87  P  FEV1-Pre L 2.28  P  FEV1-Predicted Pre % 92  P  FEV1-Post L 2.44  P  DLCO uncorrected ml/min/mmHg 19.56  P  DLCO [facility]% % 90  P  DLVA Predicted % 116  P  TLC L 4.15  P  TLC % Predicted % 82  P  RV % Predicted % 85  P    P Preliminary result    No results found for: "NITRICOXIDE"      Assessment & Plan:   Moderate persistent asthma Not typical of sarcoid flare and imaging has been clear without new infiltrates on multiple occasions. 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 She responds well to steroids and tends to flare when she comes off of them. I had started her on high dose Symbicort, which she received benefit from; however, she stopped this after her course of steroids in November. We will restart ICS/LABA therapy for maintenance today. Rx prednisone taper and cough control measures. Action plan in place. May consider addition of singulair or could consider trying her on biologic therapy in the future as well. Lab not available today - will check allergen panel once she is off steroids >4 weeks to assess for allergic phenotype. Action plan in place.  Patient Instructions  Continue Albuterol inhaler 2 puffs every 6 hours as needed for shortness of breath or wheezing. Notify if symptoms persist despite rescue inhaler/neb use.  Restart astelin nasal spray as directed for nasal congestion Restart flonase nasal spray 2 sprays each nostril daily  Continue promethazine DM cough syrup 5 mL every 6 hours as needed for coughing. Do not drive after taking. May cause drowsiness    Prednisone taper. 4 tabs for 3 days, then 3 tabs for 3 days, 2 tabs for 3 days, then 1 tab for 3 days, then stop. Take in AM with food.  Benzonatate 1 capsule Three times a day for cough. Use consistently over the next 3-4 days Restart Symbicort to 160 mcg - 2 puffs Twice daily. Brush tongue and rinse mouth afterwards Mucinex 600 mg Twice daily for congestion    Upper airway cough syndrome: Suppress your cough to allow your larynx (voice box) to heal.  Limit talking for the next few days. Avoid throat clearing. Work on cough suppression with the above recommended suppressants.  Use sugar free hard candies or non-menthol cough drops during this time to soothe your throat.  Warm tea  with honey and lemon.   Chest x ray today    Follow up in 2 weeks with Dr. Lamonte Sakai or Alanson Aly. If symptoms do not improve or worsen, please  contact office for sooner follow up or seek emergency care   Upper airway cough syndrome Suspect a component of her cough is related to upper airway irritation. Advised her to resume intranasal steroid and antihistamine sprays. Cough control regimen initiated. Reviewed measures to limit further irritation/inflammation. GERD symptoms well controlled per her report.   Sarcoid No systemic symptoms and no infiltrates on imaging. See above.     I spent 38 minutes of dedicated to the care of this patient on the date of this encounter to include pre-visit review of records, face-to-face time with the patient discussing conditions above, post visit ordering of testing, clinical documentation with the electronic health record, making appropriate referrals as documented, and communicating necessary findings to members of the patients care team.  Clayton Bibles, NP 10/06/2022  Pt aware and understands NP's role.

## 2022-10-06 NOTE — Assessment & Plan Note (Addendum)
Not typical of sarcoid flare and imaging has been clear without new infiltrates on multiple occasions. 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 She responds well to steroids and tends to flare when she comes off of them. I had started her on high dose Symbicort, which she received benefit from; however, she stopped this after her course of steroids in November. We will restart ICS/LABA therapy for maintenance today. Rx prednisone taper and cough control measures. Action plan in place. May consider addition of singulair or could consider trying her on biologic therapy in the future as well. Lab not available today - will check allergen panel once she is off steroids >4 weeks to assess for allergic phenotype. Action plan in place.  Patient Instructions  Continue Albuterol inhaler 2 puffs every 6 hours as needed for shortness of breath or wheezing. Notify if symptoms persist despite rescue inhaler/neb use.  Restart astelin nasal spray as directed for nasal congestion Restart flonase nasal spray 2 sprays each nostril daily  Continue promethazine DM cough syrup 5 mL every 6 hours as needed for coughing. Do not drive after taking. May cause drowsiness    Prednisone taper. 4 tabs for 3 days, then 3 tabs for 3 days, 2 tabs for 3 days, then 1 tab for 3 days, then stop. Take in AM with food.  Benzonatate 1 capsule Three times a day for cough. Use consistently over the next 3-4 days Restart Symbicort to 160 mcg - 2 puffs Twice daily. Brush tongue and rinse mouth afterwards Mucinex 600 mg Twice daily for congestion    Upper airway cough syndrome: Suppress your cough to allow your larynx (voice box) to heal.  Limit talking for the next few days. Avoid throat clearing. Work on cough suppression with the above recommended suppressants.  Use sugar free hard candies or  non-menthol cough drops during this time to soothe your throat.  Warm tea with honey and lemon.   Chest x ray today    Follow up in 2 weeks with Dr. Lamonte Sakai or Alanson Aly. If symptoms do not improve or worsen, please contact office for sooner follow up or seek emergency care

## 2022-10-06 NOTE — Assessment & Plan Note (Signed)
Suspect a component of her cough is related to upper airway irritation. Advised her to resume intranasal steroid and antihistamine sprays. Cough control regimen initiated. Reviewed measures to limit further irritation/inflammation. GERD symptoms well controlled per her report.

## 2022-10-07 ENCOUNTER — Encounter: Payer: Self-pay | Admitting: Nurse Practitioner

## 2022-10-07 NOTE — Progress Notes (Signed)
Agree with plans

## 2022-10-10 ENCOUNTER — Telehealth: Payer: Self-pay | Admitting: Nurse Practitioner

## 2022-10-10 NOTE — Telephone Encounter (Signed)
Recommend she be seen in ED for further workup/eval if she has not had any improvement with steroids, previous abx courses, inhalers, and cough control measures and is now having worsening symptoms with hemoptysis. Thanks.

## 2022-10-10 NOTE — Telephone Encounter (Signed)
Patient still having symptoms of cough and mucus. Pharmacy is [facility]. Patient phone number is [phone].

## 2022-10-10 NOTE — Telephone Encounter (Signed)
ATC LVMTCB x 1  

## 2022-10-10 NOTE — Telephone Encounter (Signed)
Reviewed Anna Lucero's advice with pt who stated understanding. Nothing further needed at this time.

## 2022-10-10 NOTE — Telephone Encounter (Signed)
Spoke with pt who states Saturday her productive cough turned to producing yellow/ blood tinged sputum. Pt denies any other symptoms. Pt states she is still wheezing. Pt states PRN albuterol and cough syrup and medications are not helping. Pt states she is currently on day 3 of prednisone taper.

## 2022-10-17 ENCOUNTER — Encounter (HOSPITAL_COMMUNITY): Payer: Self-pay | Admitting: Psychiatry

## 2022-10-17 ENCOUNTER — Telehealth (HOSPITAL_BASED_OUTPATIENT_CLINIC_OR_DEPARTMENT_OTHER): Payer: Medicaid Other | Admitting: Psychiatry

## 2022-10-17 DIAGNOSIS — F419 Anxiety disorder, unspecified: Secondary | ICD-10-CM | POA: Diagnosis not present

## 2022-10-17 DIAGNOSIS — F330 Major depressive disorder, recurrent, mild: Secondary | ICD-10-CM

## 2022-10-17 MED ORDER — DOXEPIN HCL 50 MG PO CAPS: 50.0000 mg | ORAL_CAPSULE | Freq: Every day | ORAL | 2 refills | Status: DC

## 2022-10-17 MED ORDER — SERTRALINE HCL 50 MG PO TABS: ORAL_TABLET | ORAL | 2 refills | Status: DC
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
## 2022-10-24 ENCOUNTER — Encounter: Payer: Self-pay | Admitting: Nurse Practitioner

## 2022-10-24 ENCOUNTER — Ambulatory Visit (INDEPENDENT_AMBULATORY_CARE_PROVIDER_SITE_OTHER): Payer: No Typology Code available for payment source | Admitting: Nurse Practitioner

## 2022-10-24 VITALS — BP 122/88 | HR 119 | Temp 98.7°F | Ht 62.0 in | Wt 218.2 lb

## 2022-10-24 DIAGNOSIS — J4540 Moderate persistent asthma, uncomplicated: Secondary | ICD-10-CM

## 2022-10-24 DIAGNOSIS — H1032 Unspecified acute conjunctivitis, left eye: Secondary | ICD-10-CM

## 2022-10-24 DIAGNOSIS — R7612 Nonspecific reaction to cell mediated immunity measurement of gamma interferon antigen response without active tuberculosis: Secondary | ICD-10-CM

## 2022-10-24 DIAGNOSIS — D869 Sarcoidosis, unspecified: Secondary | ICD-10-CM | POA: Diagnosis not present

## 2022-10-24 NOTE — Progress Notes (Unsigned)
@Patient  ID: Anna Lucero, female    DOB: 26-Mar-1976, 47 y.o.   MRN: [id number]  No chief complaint on file.   Referring provider: Mittie Bodo  HPI: 47 year old female, never smoker followed for sarcoidosis. She is a patient of Dr. Agustina Caroli and last seen in office 10/06/2022 by [facility] NP. Past medical history significant for GERD, PCOS, PTSD, obesity. She has rheumatoid arthritis, followed by rheumatology, on Orencia injections.   TEST/EVENTS:  04/26/2019 PFT: FVC 88, FEV1 92, ratio 87, TLC 82, DLCOunc 90. No BD; did have some midflow reversibility  07/19/2019 CT chest wo contrast: Resolved LAD. Residual thickening of the inferior left major fissure.  05/19/2022 CXR: there is minimal scarring in the lingula, unchanged. Otherwise, lungs are clear.  10/06/2022 CXR: mild peribronchial thickening  05/25/2022: OV with Madden Piazza NP for acute visit. Around two weeks ago, she developed URI and productive cough with yellow sputum. She was evaluated by her PCP on 10/11 and treated for sinusitis with augmentin course and supportive care. She then went to the ED on 10/19 with increased cough and SOB. She left because the wait was too long. Had a televisit with her PCP on 10/20. Started on prednisone and cough syrup. CXR was without any acute process. Treated for Acute bronchitis related to recent URI. Bronchospasm on exam. Depo inj 80 mg x 1. Recommended extending prednisone taper. Start ICS/LABA; can transition to PRN once symptoms resolve. She has been appropriately covered from an antimicrobial standpoint with augmentin.    06/08/2022: OV with Monaca Wadas NP for follow up. She was initially feeling better when she was on the higher doses of steroids but over the past few days to a week, she's started coughing more and noticed more wheezing/chest tightness. The cough is paroxysmal at times. Minimally productive with clear to white sputum. Does get some throat tickle and urge to clear her throat. Breathing feels  about the same. Denies any fevers, chills, hemoptysis, headaches, sore throat, leg swelling, orthopnea. No interim sick exposures. She has ran out of tessalon and the cough syrup she was on. Using symbicort Twice daily. Eating and drinking well.   10/07/2022: OV with Katerra Ingman NP for acute visit. She was treated with prednisone taper, cough control measures and started on high dose Symbicort at our last visit. Her cough improved and she was feeling better. Not noticing much wheezing up until the end of January. She called back in on 1/30 with reports of productive cough and wheezing for a week. She was treated for bronchitis with z pack and prednisone taper. Advised on cough control regimen. Advised to keep follow up appt 2/7, which she ended up having to reschedule. Today, she tells me that she was feeling better after the steroids/abx, but over the past 2-3 weeks, she has started coughing again and wheezing more. Cough is occasionally productive with white sputum but usually dry. She feels slightly more short winded, mainly with coughing spells. She has chest tightness. Mild nasal congestion but no significant drainage. She denies any interim sick exposures. Denies fevers, chills, hemoptysis, leg swelling, night sweats, orthopnea. No skin lesions or visual changes. She stopped using the Symbicort sometime early January because she was feeling better. Receives benefit from albuterol. She is still using promethazine DM cough syrup at night, which helps calm her cough so she can sleep.   10/24/2022: Today - follow up Patient presents today for follow up after being treated for an asthma exacerbation. Felt this was not  related to her sarcoid as imaging had been without infiltrates on numerous occasions and presentation was more consistent with asthmatic flare. Since she was here last, she went to urgent care on 3/17 due to wheezing. She was treated with steroid injection and prednisone burst. She is feeling better from  an asthma standpoint. Feels like her breathing is finally improving. Her cough is mostly resolved. She still notices an occasional wheeze at night but overall, sounds much better. No skin lesions, visual changes. She was diagnosed with conjunctivitis yesterday at Urgent Care. She started abx drops. She had similar symptoms a few months ago. She has not been back to see her eye doctor to evaluate this further. Denies any pain or drainage.    Her biggest concern today is that she recently tested positive for TB with quantiferon gold; testing completed by her health at work at [employer]. She actually had two positive tests as they repeated the test to make sure it was accurate. This was earlier this month. She has not heard anything else from them regarding next steps. Of note, she did have a negative quantiferon gold test in January with her rheumatologist. She denies any hemoptysis, fevers, chills, night sweats, weight loss, anorexia. No known exposures. 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   Allergies  Allergen Reactions   Infliximab Itching and Shortness Of Breath    Patient was premedicated   Patient was premedicated      Lamictal [Lamotrigine]     Skin rash, likely not SJS but concerning   Celebrex [Celecoxib] Other (See Comments)    Chest pain   Hydrocodone-Acetaminophen Other (See Comments)   Spironolactone Other (See Comments)    Bleeding    Immunization History  Administered Date(s) Administered   Influenza Inj Mdck Quad With Preservative 09/29/2017   Influenza Split 05/01/2018   Influenza, Seasonal, Injecte, Preservative Fre 09/29/2017   Influenza,inj,Quad PF,6+ Mos 06/08/2022   Influenza,trivalent, recombinat, inj, PF 05/01/2018   Influenza-Unspecified 09/29/2017   Moderna Sars-Covid-2 Vaccination 05/22/2020, 06/24/2020   PPD Test 10/01/2018   Tdap 06/01/2017    Past Medical History:  Diagnosis Date   Anemia     Anxiety    Bronchitis    Depression    GERD (gastroesophageal reflux disease)    Gestational diabetes    Iritis    PCOS (polycystic ovarian syndrome)    RA (rheumatoid arthritis) ([place])     Tobacco History: Social History   Tobacco Use  Smoking Status Never  Smokeless Tobacco Never   Counseling given: Not Answered   Outpatient Medications Prior to Visit  Medication Sig Dispense Refill   amLODipine (NORVASC) 5 MG tablet Take by mouth.     benzonatate (TESSALON) 200 MG capsule Take 1 capsule (200 mg total) by mouth 3 (three) times daily as needed for cough. 30 capsule 1   doxepin (SINEQUAN) 50 MG capsule Take 1 capsule (50 mg total) by mouth at bedtime. 30 capsule 2   methocarbamol (ROBAXIN) 500 MG tablet Take 500 mg by mouth 4 (four) times daily.     omeprazole (PRILOSEC) 40 MG capsule TAKE 1 CAPSULE(40 MG) BY MOUTH IN THE MORNING AND AT BEDTIME 60 capsule 11   rosuvastatin (CRESTOR) 20 MG tablet      sertraline (ZOLOFT) 50 MG tablet Take one tab daily 30 tablet 2   abatacept (ORENCIA) 250 MG injection Inject into the vein.     acetaminophen (TYLENOL) 500  MG tablet Take 2 tablets (1,000 mg total) by mouth every 8 (eight) hours as needed for moderate pain. (Patient not taking: Reported on 10/06/2022) 30 tablet 0   budesonide-formoterol (SYMBICORT) 160-4.5 MCG/ACT inhaler Inhale 2 puffs into the lungs in the morning and at bedtime. (Patient not taking: Reported on 10/06/2022) 1 each 6   Cholecalciferol 1.25 MG (50000 UT) capsule Take 1 capsule by mouth once a week. (Patient not taking: Reported on 10/06/2022)     fluticasone (FLONASE) 50 MCG/ACT nasal spray Place 2 sprays into both nostrils daily. (Patient not taking: Reported on 10/06/2022) 18.2 mL 2   albuterol (VENTOLIN HFA) 108 (90 Base) MCG/ACT inhaler Inhale into the lungs. (Patient not taking: Reported on 10/06/2022)     ARIPiprazole (ABILIFY) 2 MG tablet Take 1 tablet (2 mg total) by mouth daily. (Patient not taking: Reported on  10/06/2022) 30 tablet 2   Ascorbic Acid (VITAMIN C) 100 MG tablet Take 100 mg by mouth daily.     azithromycin (ZITHROMAX Z-PAK) 250 MG tablet Take 2 tabs today, then 1 tab until gone (Patient not taking: Reported on 10/06/2022) 6 each 0   azithromycin (ZITHROMAX) 250 MG tablet Take 2 tablets on day one then 1 tablet daily for four additional days (Patient not taking: Reported on 10/06/2022) 6 tablet 0   predniSONE (DELTASONE) 10 MG tablet 4 tabs for 3 days, then 3 tabs for 3 days, 2 tabs for 3 days, then 1 tab for 3 days, then stop 30 tablet 0   promethazine-dextromethorphan (PROMETHAZINE-DM) 6.25-15 MG/5ML syrup Take 5 mLs by mouth 4 (four) times daily as needed for cough. (Patient not taking: Reported on 10/06/2022) 180 mL 0   No facility-administered medications prior to visit.     Review of Systems:   Constitutional: No weight loss or gain, night sweats, fevers, chills, or lassitude. +fatigue  HEENT: No headaches, difficulty swallowing, tooth/dental problems, or sore throat. No sneezing, itching, ear ache, or post nasal drip, nasal congestion. +red eye CV:  No chest pain, orthopnea, PND, swelling in lower extremities, anasarca, dizziness, palpitations, syncope Resp: +shortness of breath with exertion (improved); rare wheeze. No cough. No hemoptysis. No chest wall deformity GI:  No heartburn, indigestion, abdominal pain, nausea, vomiting, diarrhea, change in bowel habits, loss of appetite, bloody stools.  Skin: No rash, lesions, ulcerations MSK:  No joint pain or swelling.  No decreased range of motion.  No back pain. Neuro: No dizziness or lightheadedness.  Psych: No depression or anxiety. Mood stable.     Physical Exam:  BP 122/88 (BP Location: Right Arm, Patient Position: Sitting, Cuff Size: Normal)   Pulse (!) 119   Temp 98.7 F (37.1 C) (Oral)   Ht 5\' 2"  (1.575 m)   Wt 218 lb 3.2 oz (99 kg)   LMP 03/20/2020   SpO2 98%   BMI 39.91 kg/m   GEN: Pleasant, interactive, well-kempt;  non-toxic and in no acute distress HEENT:  Normocephalic and atraumatic. PERRLA. Left sclera injected. No exudate. Right sclera white. Nasal turbinates pink, moist and patent bilaterally. No rhinorrhea present. Oropharynx pink and moist, without exudate or edema. No lesions, ulcerations NECK:  Supple w/ fair ROM. No JVD present. Normal carotid impulses w/o bruits. Thyroid symmetrical with no goiter or nodules palpated. No LAD. CV: RRR, no m/r/g, no peripheral edema. Pulses intact, +2 bilaterally. No cyanosis, pallor or clubbing. PULMONARY:  Unlabored, regular breathing. Clear bilaterally A&P w/o wheezes/rales/rhonchi. No accessory muscle use.  GI: BS present and normoactive. Soft, non-tender  to palpation. No organomegaly or masses detected. MSK: No erythema, warmth or tenderness. No deformities or joint swelling noted.  Neuro: A/Ox3. No focal deficits noted.   Skin: Warm, no lesions or rashe Psych: Normal affect and behavior. Judgement and thought content appropriate.     Lab Results:  CBC    Component Value Date/Time   WBC 9.7 05/19/2022 2147   RBC 5.04 05/19/2022 2147   HGB 14.0 05/19/2022 2147   HCT 44.3 05/19/2022 2147   PLT 289 05/19/2022 2147   MCV 87.9 05/19/2022 2147   MCH 27.8 05/19/2022 2147   MCHC 31.6 05/19/2022 2147   RDW 13.6 05/19/2022 2147   LYMPHSABS 3.0 05/19/2022 2147   MONOABS 0.6 05/19/2022 2147   EOSABS 0.6 (H) 05/19/2022 2147   BASOSABS 0.1 05/19/2022 2147    BMET    Component Value Date/Time   NA 137 10/24/2022 1549   K 3.6 10/24/2022 1549   CL 104 10/24/2022 1549   CO2 23 10/24/2022 1549   GLUCOSE 115 (H) 10/24/2022 1549   BUN 17 10/24/2022 1549   CREATININE 0.88 10/24/2022 1549   CALCIUM 9.6 10/24/2022 1549   GFRNONAA >60 05/19/2022 2147   GFRAA >60 01/18/2019 0411    BNP    Component Value Date/Time   BNP 34.2 01/12/2019 1855     Imaging:  DG Chest 2 View  Result Date: 10/06/2022 CLINICAL DATA:  Sarcoid. Productive cough. Sarcoid  versus asthma flare. EXAM: CHEST - 2 VIEW COMPARISON:  Most recent radiograph 06/08/2022 FINDINGS: The heart is normal in size. Stable mediastinal contours. Prominent left epicardial fat pad. Mild peribronchial thickening. No focal airspace disease. No pleural effusion or pneumothorax. Normal pulmonary vasculature. No acute osseous findings. IMPRESSION: Mild peribronchial thickening, can be seen with bronchitis or asthma. Electronically Signed   By: Keith Rake M.D.   On: 10/06/2022 16:48          Latest Ref Rng & Units 04/26/2019   12:54 PM  PFT Results  FVC-Pre L 2.68  P  FVC-Predicted Pre % 88  P  FVC-Post L 2.80  P  FVC-Predicted Post % 92  P  Pre FEV1/FVC % % 85  P  Post FEV1/FCV % % 87  P  FEV1-Pre L 2.28  P  FEV1-Predicted Pre % 92  P  FEV1-Post L 2.44  P  DLCO uncorrected ml/min/mmHg 19.56  P  DLCO UNC% % 90  P  DLVA Predicted % 116  P  TLC L 4.15  P  TLC % Predicted % 82  P  RV % Predicted % 85  P    P Preliminary result    No results found for: "NITRICOXIDE"      Assessment & Plan:   Moderate persistent asthma Recent slow to resolve exacerbation; clinically improved today. From an asthma standpoint, she seems to be doing much better. If she has another flare, we may want to consider biologic therapy. Action plan in place.   Patient Instructions  Continue Albuterol inhaler 2 puffs every 6 hours as needed for shortness of breath or wheezing. Notify if symptoms persist despite rescue inhaler/neb use.  Continue astelin nasal spray as directed for nasal congestion Continue flonase nasal spray 2 sprays each nostril daily  Continue Symbicort to 160 mcg - 2 puffs Twice daily. Brush tongue and rinse mouth afterwards  CT chest ordered for further evaluation of your positive TB tests - someone will contact you to schedule this   Follow up in 2 weeks with  Dr. Lamonte Sakai or Katie Brallan Denio,NP. 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 Initially questioned latent TB; however, she had a negative quantiferon gold with rheumatology in January. No active symptoms of TB. CT chest ordered for further evaluation.   Sarcoid No evidence of active disease on imaging. Her respiratory symptoms have mostly resolved with treatment for asthma flare. Advised her to have her eye evaluated by ophthalmology to ensure autoimmune disease not contributing; although, would have expected this to improve with steroid course if related to sarcoid or other inflammatory process  Conjunctivitis See above. Continue eye drops as prescribed by urgent care until evaluated by ophthalmologist    I spent 42 minutes of dedicated to the care of this patient on the date of this encounter to include pre-visit review of records, face-to-face time with the patient discussing conditions above, post visit ordering of testing, clinical documentation with the electronic health record, making appropriate referrals as documented, and communicating necessary findings to members of the patients care team.  Clayton Bibles, NP 10/27/2022  Pt aware and understands NP's role.

## 2022-10-24 NOTE — Patient Instructions (Signed)
Continue Albuterol inhaler 2 puffs every 6 hours as needed for shortness of breath or wheezing. Notify if symptoms persist despite rescue inhaler/neb use.  Continue astelin nasal spray as directed for nasal congestion Continue flonase nasal spray 2 sprays each nostril daily  Continue Symbicort to 160 mcg - 2 puffs Twice daily. Brush tongue and rinse mouth afterwards  CT chest ordered for further evaluation of your positive TB tests - someone will contact you to schedule this   Follow up in 2 weeks with Dr. Lamonte Sakai or Alanson Aly. If symptoms do not improve or worsen, please contact office for sooner follow up or seek emergency care

## 2022-10-25 LAB — BASIC METABOLIC PANEL
BUN: 17 mg/dL (ref 6–23)
CO2: 23 mEq/L (ref 19–32)
Calcium: 9.6 mg/dL (ref 8.4–10.5)
Chloride: 104 mEq/L (ref 96–112)
Creatinine, Ser: 0.88 mg/dL (ref 0.40–1.20)
GFR: 78.42 mL/min (ref >60.00)
Glucose, Bld: 115 mg/dL — ABNORMAL HIGH (ref 70–99)
Potassium: 3.6 mEq/L (ref 3.5–5.1)
Sodium: 137 mEq/L (ref 135–145)

## 2022-10-26 DIAGNOSIS — R7612 Nonspecific reaction to cell mediated immunity measurement of gamma interferon antigen response without active tuberculosis: Secondary | ICD-10-CM | POA: Insufficient documentation

## 2022-10-26 NOTE — Assessment & Plan Note (Addendum)
No evidence of active disease on imaging. Her respiratory symptoms have mostly resolved with treatment for asthma flare. Advised her to have her eye evaluated by ophthalmology to ensure autoimmune disease not contributing; although, would have expected this to improve with steroid course if related to sarcoid.

## 2022-10-26 NOTE — Assessment & Plan Note (Signed)
Two positive quantiferon gold tests. Initially questioned latent TB; however, she had a negative quantiferon gold with rheumatology in January. No active symptoms of TB. CT chest ordered for further evaluation. If no evidence of active disease, will send referral to ID for evaluation of latent TB.

## 2022-10-26 NOTE — Assessment & Plan Note (Addendum)
Recent slow to resolve exacerbation; clinically improved today. From an asthma standpoint, she seems to be doing much better. If she has another flare, we may want to consider biologic therapy. Action plan in place.   Patient Instructions  Continue Albuterol inhaler 2 puffs every 6 hours as needed for shortness of breath or wheezing. Notify if symptoms persist despite rescue inhaler/neb use.  Continue astelin nasal spray as directed for nasal congestion Continue flonase nasal spray 2 sprays each nostril daily  Continue Symbicort to 160 mcg - 2 puffs Twice daily. Brush tongue and rinse mouth afterwards  CT chest ordered for further evaluation of your positive TB tests - someone will contact you to schedule this   Follow up in 2 weeks with Dr. Lamonte Sakai or Alanson Aly. If symptoms do not improve or worsen, please contact office for sooner follow up or seek emergency care

## 2022-10-27 ENCOUNTER — Encounter: Payer: Self-pay | Admitting: Nurse Practitioner

## 2022-10-27 DIAGNOSIS — H109 Unspecified conjunctivitis: Secondary | ICD-10-CM | POA: Insufficient documentation

## 2022-10-27 NOTE — Assessment & Plan Note (Addendum)
See above. Continue eye drops as prescribed by urgent care until evaluated by ophthalmologist

## 2022-10-31 ENCOUNTER — Ambulatory Visit (HOSPITAL_BASED_OUTPATIENT_CLINIC_OR_DEPARTMENT_OTHER)
Admission: RE | Admit: 2022-10-31 | Discharge: 2022-10-31 | Disposition: A | Discharge status: Home / Self Care | Payer: Medicaid Other | Source: Ambulatory Visit | Attending: Nurse Practitioner | Admitting: Nurse Practitioner

## 2022-10-31 DIAGNOSIS — R7612 Nonspecific reaction to cell mediated immunity measurement of gamma interferon antigen response without active tuberculosis: Secondary | ICD-10-CM | POA: Diagnosis present

## 2022-10-31 MED ORDER — IOHEXOL 350 MG/ML SOLN
100.0000 mL | Freq: Once | INTRAVENOUS | Status: AC | PRN
  Administered 2022-10-31: 75 mL via INTRAVENOUS

## 2022-11-02 ENCOUNTER — Telehealth: Payer: Self-pay | Admitting: Nurse Practitioner

## 2022-11-02 NOTE — Telephone Encounter (Signed)
Results are not back yet. I will let her know once I receive them. Thanks!

## 2022-11-02 NOTE — Telephone Encounter (Signed)
Anna Lucero if available can you please advise on CT results?

## 2022-11-02 NOTE — Telephone Encounter (Signed)
Spoke with patient. Advised CT results are not back yet, but we would call once we have those. She verbalized understanding. NFN

## 2022-11-09 ENCOUNTER — Telehealth: Payer: Self-pay | Admitting: *Deleted

## 2022-11-09 ENCOUNTER — Ambulatory Visit: Payer: No Typology Code available for payment source | Admitting: Nurse Practitioner

## 2022-11-09 NOTE — Telephone Encounter (Signed)
Called and spoke with patient, advised her of change in appointment from today at 4 pm with Florentina Addison, to Tuesday April 16th at 1 pm with Dr. Delton Coombes to review CT scan, advised to arrive by 12:45 pm for check in.  I also let her know that Florentina Addison stated that her CT scan looked stable to her, however, she wanted to get Dr. Kavin Leech opinion.  She verbalized understanding.

## 2022-11-11 ENCOUNTER — Other Ambulatory Visit (HOSPITAL_COMMUNITY): Payer: Self-pay | Admitting: Psychiatry

## 2022-11-11 DIAGNOSIS — F419 Anxiety disorder, unspecified: Secondary | ICD-10-CM

## 2022-11-11 DIAGNOSIS — F330 Major depressive disorder, recurrent, mild: Secondary | ICD-10-CM

## 2022-11-15 ENCOUNTER — Ambulatory Visit (INDEPENDENT_AMBULATORY_CARE_PROVIDER_SITE_OTHER): Payer: Medicaid Other | Admitting: Emergency Medicine

## 2022-11-15 ENCOUNTER — Encounter: Payer: Self-pay | Admitting: Emergency Medicine

## 2022-11-15 VITALS — BP 148/90 | HR 106 | Temp 98.8°F | Resp 96 | Ht 62.0 in | Wt 225.0 lb

## 2022-11-15 DIAGNOSIS — D869 Sarcoidosis, unspecified: Secondary | ICD-10-CM

## 2022-11-15 DIAGNOSIS — R7612 Nonspecific reaction to cell mediated immunity measurement of gamma interferon antigen response without active tuberculosis: Secondary | ICD-10-CM | POA: Diagnosis not present

## 2022-11-15 DIAGNOSIS — J4540 Moderate persistent asthma, uncomplicated: Secondary | ICD-10-CM

## 2022-11-15 NOTE — Patient Instructions (Signed)
We reviewed your testing today.  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  There may be benefit to taking isoniazid for 6 months to decrease the likelihood that you would ever develop active TB. You need to talk to your rheumatologist about the latent TB diagnosis.  This may impact whether you can stay on Orencia.  It is possible that you may be able to be on immunosuppressive medications if you decide to do 6 months of isoniazid.  You will need to talk to rheumatology about this. Stop your Symbicort for now Keep DuoNeb available to use up to every 6 hours if needed for shortness of breath, chest tightness, wheezing. We will perform full pulmonary function testing in next office visit. Continue your Flonase nasal spray Continue omeprazole twice a day.  Try to be mindful of good dietary choices to avoid reflux Follow Dr. Delton Coombes next available with PFT on the same day

## 2022-11-15 NOTE — Assessment & Plan Note (Signed)
This diagnosis is unclear.  Cough, mucus production upper airway noise are present.  We will repeat her pulmonary function testing, try to quantify whether there is any has been present.  For now stop her Symbicort.  Try to treat GERD and rhinitis as aggressively as possible.  Did discuss with her the possibility that we may need to do AFB testing for completeness if the cough continues.  Stop your Symbicort for now Keep DuoNeb available to use up to every 6 hours if needed for shortness of breath, chest tightness, wheezing. We will perform full pulmonary function testing in next office visit. Continue your Flonase nasal spray Continue omeprazole twice a day.  Try to be mindful of good dietary choices to avoid reflux Follow Dr. Delton Coombes next available with PFT on the same day

## 2022-11-15 NOTE — Assessment & Plan Note (Signed)
Her CT scan of the chest is reassuring without any evidence to suggest active tuberculosis.  She has had GERD, chronic rhinitis and associated cough, unclear whether this merits AFB testing either by sputum or BAL given the clear CT chest.  Could consider going forward depending on chronicity of the symptoms.  She has RA, is on Orencia.  Will need to confer with rheumatology regarding whether she can stay on this medication (I suspect not), whether she can restart immunosuppression if she does 6 months of isoniazid for latent TB.  She is going to contact rheumatology to discuss.  We reviewed your testing today.  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  There may be benefit to taking isoniazid for 6 months to decrease the likelihood that you would ever develop active TB. You need to talk to your rheumatologist about the latent TB diagnosis.  This may impact whether you can stay on Orencia.  It is possible that you may be able to be on immunosuppressive medications if you decide to do 6 months of isoniazid.  You will need to talk to rheumatology about this.

## 2022-11-15 NOTE — Progress Notes (Signed)
Subjective:    Patient ID: Anna Lucero, female    DOB: Aug 17, 1975, 47 y.o.   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  She has been treated in the past for clinical asthma although no evidence of obstructive lung disease on pulmonary function testing done 04/26/2019.  She has chronic rhinitis.  She has a new positive QuantiFERON gold that was done at [facility] (health at work), after having been negative 08/2022 with rheumatology. She is currently managed on Symbicort, Flonase, omeprazole twice daily. She has been on symbicort for over a month - hasn't seemed to help her. Uses duoneb about 4x a week.   CT chest 10/31/2022 reviewed by me, shows stable anterior mediastinal tissue consistent with some residual thymus, no airspace disease or infiltrate, no effusions.  There is a 3 mm nodular density in the left lower lobe superior segment that is unchanged going back to 07/19/2019.   Review of Systems  Respiratory:  Positive for cough and shortness of breath.   Cardiovascular:        Irregular Heartbeats  Gastrointestinal:        Acid Reflux  Musculoskeletal:  Positive for arthralgias.  Psychiatric/Behavioral:  The patient is nervous/anxious.     Past Medical History:  Diagnosis Date   Anemia    Anxiety    Bronchitis    Depression    GERD (gastroesophageal reflux disease)    Gestational diabetes    Iritis    PCOS (polycystic ovarian syndrome)    RA (rheumatoid arthritis)      Family History  Problem Relation Age of Onset   Alcohol abuse Mother    Drug abuse Mother    Bipolar disorder Mother    Anxiety disorder Mother    Depression Mother    Alcohol abuse Father    Drug abuse Father    Anxiety disorder Sister    Depression Sister    Breast cancer Neg Hx       Social History   Socioeconomic History   Marital status: Married    Spouse name: Not on file   Number of children: 3   Years of education: Not on file   Highest education level: Some college, no degree  Occupational History    Comment: full time  Tobacco Use   Smoking status: Never   Smokeless tobacco: Never  Vaping Use   Vaping Use: Never used  Substance and Sexual Activity   Alcohol use: Yes    Alcohol/week: 3.0 standard drinks of alcohol    Types: 3 Glasses of wine per week    Comment: 3x a week   Drug use: No   Sexual activity: Yes    Birth control/protection: I.U.D.  Other Topics Concern   Not on file  Social History Narrative   Not on file   Social Determinants of Health   Financial Resource Strain: Low Risk  (08/25/2018)   Overall Financial Resource Strain (CARDIA)    Difficulty of Paying Living Expenses: Not hard at all  Food Insecurity: No Food Insecurity (08/25/2018)   Hunger Vital Sign    Worried About Running Out of Food in the Last Year: Never true    Ran Out of Food in the Last Year: Never true  Transportation Needs: No Transportation Needs (08/25/2018)   PRAPARE - Transportation  Lack of Transportation (Medical): No    Lack of Transportation (Non-Medical): No  Physical Activity: Inactive (08/25/2018)   Exercise Vital Sign    Days of Exercise per Week: 0 days    Minutes of Exercise per Session: 0 min  Stress: Stress Concern Present (08/25/2018)   [employer] of Occupational Health - Occupational Stress Questionnaire    Feeling of Stress : Very much  Social Connections: Unknown (08/25/2018)   Social Connection and Isolation Panel [NHANES]    Frequency of Communication with Friends and Family: Not on file    Frequency of Social Gatherings with Friends and Family: Not on file    Attends Religious Services: More than 4 times per year    Active Member of [employer] or Organizations: No    Attends Banker Meetings: Never    Marital Status:  Married  Catering manager Violence: Not At Risk (08/25/2018)   Humiliation, Afraid, Rape, and Kick questionnaire    Fear of Current or Ex-Partner: No    Emotionally Abused: No    Physically Abused: No    Sexually Abused: No  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   Allergies  Allergen Reactions   Infliximab Itching and Shortness Of Breath    Patient was premedicated   Patient was premedicated      Lamictal [Lamotrigine]     Skin rash, likely not SJS but concerning   Celebrex [Celecoxib] Other (See Comments)    Chest pain   Hydrocodone-Acetaminophen Other (See Comments)   Spironolactone Other (See Comments)    Bleeding     Outpatient Medications Prior to Visit  Medication Sig Dispense Refill   amLODipine (NORVASC) 5 MG tablet Take by mouth.     benzonatate (TESSALON) 200 MG capsule Take 1 capsule (200 mg total) by mouth 3 (three) times daily as needed for cough. 30 capsule 1   budesonide-formoterol (SYMBICORT) 160-4.5 MCG/ACT inhaler Inhale 2 puffs into the lungs in the morning and at bedtime. 1 each 6   Cholecalciferol 1.25 MG (50000 UT) capsule Take 1 capsule by mouth once a week.     doxepin (SINEQUAN) 50 MG capsule Take 1 capsule (50 mg total) by mouth at bedtime. 30 capsule 2   fluticasone (FLONASE) 50 MCG/ACT nasal spray Place 2 sprays into both nostrils daily. 18.2 mL 2   methocarbamol (ROBAXIN) 500 MG tablet Take 500 mg by mouth 4 (four) times daily.     omeprazole (PRILOSEC) 40 MG capsule TAKE 1 CAPSULE(40 MG) BY MOUTH IN THE MORNING AND AT BEDTIME 60 capsule 11   rosuvastatin (CRESTOR) 20 MG tablet      sertraline (ZOLOFT) 50 MG tablet Take one tab daily 30 tablet 2   abatacept (ORENCIA) 250 MG injection Inject into the vein.     acetaminophen (TYLENOL) 500 MG tablet Take 2 tablets (1,000 mg total) by mouth every 8 (eight) hours as needed for moderate pain. (Patient not taking: Reported on 10/06/2022) 30 tablet 0   No facility-administered medications prior to  visit.        Objective:   Physical Exam  Vitals:   11/15/22 1311  BP: (!) 148/90  Pulse: (!) 106  Resp: (!) 96  Temp: 98.8 F (37.1 C)  TempSrc: Oral  SpO2: 96%  Weight: 225 lb (102.1 kg)  Height:  (1.575 m)   Gen: Pleasant, overwt woman, in no distress,  normal affect  ENT: No lesions,  mouth clear,  oropharynx clear, no postnasal  drip  Neck: No JVD, no stridor  Lungs: No use of accessory muscles, no crackles or wheezing on normal respiration, no wheeze on forced expiration  Cardiovascular: RRR, heart sounds normal, no murmur or gallops, no peripheral edema  Musculoskeletal: No deformities, no cyanosis or clubbing  Neuro: alert, awake, non focal  Skin: no rash      Assessment & Plan:  Positive QuantiFERON-TB Gold test Her CT scan of the chest is reassuring without any evidence to suggest active tuberculosis.  She has had GERD, chronic rhinitis and associated cough, unclear whether this merits AFB testing either by sputum or BAL given the clear CT chest.  Could consider going forward depending on chronicity of the symptoms.  She has RA, is on Orencia.  Will need to confer with rheumatology regarding whether she can stay on this medication (I suspect not), whether she can restart immunosuppression if she does 6 months of isoniazid for latent TB.  She is going to contact rheumatology to discuss.  We reviewed your testing today.  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  There may be benefit to taking isoniazid for 6 months to decrease the likelihood that you would ever develop active TB. You need to talk to your rheumatologist about the latent TB diagnosis.  This may impact whether you can stay on Orencia.  It is possible that you may be able to be on immunosuppressive medications if you decide to do 6 months of isoniazid.  You  will need to talk to rheumatology about this.  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  She does have chronic cough and upper airway symptoms, unclear whether there is asthma present.  Plan to repeat her pulmonary function testing and compare with 04/2019.  There was no significant obstruction at that time.  Moderate persistent asthma This diagnosis is unclear.  Cough, mucus production upper airway noise are present.  We will repeat her pulmonary function testing, try to quantify whether there is any has been present.  For now stop her Symbicort.  Try to treat GERD and rhinitis as aggressively as possible.  Did discuss with her the possibility that we may need to do AFB testing for completeness if the cough continues.  Stop your Symbicort for now Keep DuoNeb available to use up to every 6 hours if needed for shortness of breath, chest tightness, wheezing. We will perform full pulmonary function testing in next office visit. Continue your Flonase nasal spray Continue omeprazole twice a day.  Try to be mindful of good dietary choices to avoid reflux Follow Dr. Delton Coombes next available with PFT on the same day   Levy Pupa, MD, PhD 11/15/2022, 4:41 PM [facility] [phone] or if no answer [phone]

## 2022-11-15 NOTE — Assessment & Plan Note (Signed)
Reactive disease in the chest based on her CT scan.  She does have chronic cough and upper airway symptoms, unclear whether there is asthma present.  Plan to repeat her pulmonary function testing and compare with 04/2019.  There was no significant obstruction at that time.

## 2022-11-24 ENCOUNTER — Ambulatory Visit: Payer: Medicaid Other | Admitting: Emergency Medicine

## 2022-12-19 ENCOUNTER — Other Ambulatory Visit (HOSPITAL_BASED_OUTPATIENT_CLINIC_OR_DEPARTMENT_OTHER): Payer: Self-pay

## 2022-12-19 DIAGNOSIS — J209 Acute bronchitis, unspecified: Secondary | ICD-10-CM

## 2022-12-19 DIAGNOSIS — J4540 Moderate persistent asthma, uncomplicated: Secondary | ICD-10-CM

## 2022-12-20 ENCOUNTER — Ambulatory Visit (INDEPENDENT_AMBULATORY_CARE_PROVIDER_SITE_OTHER): Payer: Medicaid Other | Admitting: Emergency Medicine

## 2022-12-20 ENCOUNTER — Ambulatory Visit: Payer: Medicaid Other | Admitting: Emergency Medicine

## 2022-12-20 DIAGNOSIS — J4540 Moderate persistent asthma, uncomplicated: Secondary | ICD-10-CM

## 2022-12-20 DIAGNOSIS — J209 Acute bronchitis, unspecified: Secondary | ICD-10-CM | POA: Diagnosis not present

## 2022-12-20 LAB — PULMONARY FUNCTION TEST
DL/VA % pred: 124 %
DL/VA: 5.45 ml/min/mmHg/L
DLCO cor % pred: 111 %
DLCO cor: 22.94 ml/min/mmHg
DLCO unc % pred: 111 %
DLCO unc: 22.94 ml/min/mmHg
FEF 25-75 Post: 2.84 L/sec
FEF 25-75 Pre: 2.07 L/sec
FEF2575-%Change-Post: 36 %
FEF2575-%Pred-Post: 100 %
FEF2575-%Pred-Pre: 73 %
FEV1-%Change-Post: 8 %
FEV1-%Pred-Post: 84 %
FEV1-%Pred-Pre: 77 %
FEV1-Post: 2.34 L
FEV1-Pre: 2.15 L
FEV1FVC-%Change-Post: 8 %
FEV1FVC-%Pred-Pre: 99 %
FEV6-%Change-Post: 0 %
FEV6-%Pred-Post: 79 %
FEV6-%Pred-Pre: 79 %
FEV6-Post: 2.7 L
FEV6-Pre: 2.68 L
FEV6FVC-%Pred-Post: 102 %
FEV6FVC-%Pred-Pre: 102 %
FVC-%Change-Post: 0 %
FVC-%Pred-Post: 77 %
FVC-%Pred-Pre: 77 %
FVC-Post: 2.7 L
FVC-Pre: 2.68 L
Post FEV1/FVC ratio: 87 %
Post FEV6/FVC ratio: 100 %
Pre FEV1/FVC ratio: 80 %
Pre FEV6/FVC Ratio: 100 %
RV % pred: 151 %
RV: 2.55 L
TLC % pred: 116 %
TLC: 5.73 L

## 2022-12-20 NOTE — Progress Notes (Signed)
Full PFT Performed Today  

## 2022-12-20 NOTE — Patient Instructions (Signed)
Full PFT Performed Today  

## 2022-12-21 ENCOUNTER — Emergency Department (HOSPITAL_COMMUNITY)
Admission: EM | Admit: 2022-12-21 | Discharge: 2022-12-21 | Disposition: A | Discharge status: Home / Self Care | Payer: Medicaid Other | Attending: Emergency Medicine | Admitting: Emergency Medicine

## 2022-12-21 ENCOUNTER — Encounter (HOSPITAL_COMMUNITY): Payer: Self-pay | Admitting: Pharmacy Technician

## 2022-12-21 ENCOUNTER — Emergency Department (HOSPITAL_COMMUNITY): Payer: Medicaid Other

## 2022-12-21 ENCOUNTER — Other Ambulatory Visit: Payer: Self-pay

## 2022-12-21 ENCOUNTER — Ambulatory Visit: Payer: Medicaid Other | Admitting: Emergency Medicine

## 2022-12-21 DIAGNOSIS — Y92410 Unspecified street and highway as the place of occurrence of the external cause: Secondary | ICD-10-CM | POA: Diagnosis not present

## 2022-12-21 DIAGNOSIS — M25571 Pain in right ankle and joints of right foot: Secondary | ICD-10-CM | POA: Insufficient documentation

## 2022-12-21 DIAGNOSIS — M25511 Pain in right shoulder: Secondary | ICD-10-CM | POA: Diagnosis present

## 2022-12-21 DIAGNOSIS — M25561 Pain in right knee: Secondary | ICD-10-CM | POA: Insufficient documentation

## 2022-12-21 MED ORDER — ACETAMINOPHEN 500 MG PO TABS: 1000.0000 mg | ORAL_TABLET | Freq: Four times a day (QID) | ORAL | 0 refills | Status: DC | PRN

## 2022-12-21 MED ORDER — OXYCODONE-ACETAMINOPHEN 5-325 MG PO TABS
1.0000 | ORAL_TABLET | Freq: Once | ORAL | Status: AC
  Administered 2022-12-21: 1 via ORAL
  Filled 2022-12-21: qty 1

## 2022-12-21 MED ORDER — CYCLOBENZAPRINE HCL 10 MG PO TABS: 10.0000 mg | ORAL_TABLET | Freq: Two times a day (BID) | ORAL | 0 refills | Status: DC | PRN
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
## 2023-01-11 ENCOUNTER — Ambulatory Visit (INDEPENDENT_AMBULATORY_CARE_PROVIDER_SITE_OTHER): Payer: Medicaid Other | Admitting: Nurse Practitioner

## 2023-01-11 ENCOUNTER — Telehealth: Payer: Self-pay | Admitting: Nurse Practitioner

## 2023-01-11 ENCOUNTER — Encounter: Payer: Self-pay | Admitting: Nurse Practitioner

## 2023-01-11 VITALS — BP 128/80 | HR 102 | Temp 98.7°F | Ht 62.0 in | Wt 217.2 lb

## 2023-01-11 DIAGNOSIS — R7612 Nonspecific reaction to cell mediated immunity measurement of gamma interferon antigen response without active tuberculosis: Secondary | ICD-10-CM

## 2023-01-11 DIAGNOSIS — J309 Allergic rhinitis, unspecified: Secondary | ICD-10-CM

## 2023-01-11 DIAGNOSIS — D869 Sarcoidosis, unspecified: Secondary | ICD-10-CM | POA: Diagnosis not present

## 2023-01-11 DIAGNOSIS — J310 Chronic rhinitis: Secondary | ICD-10-CM | POA: Insufficient documentation

## 2023-01-11 DIAGNOSIS — J4550 Severe persistent asthma, uncomplicated: Secondary | ICD-10-CM | POA: Diagnosis not present

## 2023-01-11 DIAGNOSIS — J4551 Severe persistent asthma with (acute) exacerbation: Secondary | ICD-10-CM | POA: Diagnosis not present

## 2023-01-11 LAB — CBC WITH DIFFERENTIAL/PLATELET
Basophils Absolute: 0.1 10*3/uL (ref 0.0–0.1)
Basophils Relative: 0.6 % (ref 0.0–3.0)
Eosinophils Absolute: 0.5 10*3/uL (ref 0.0–0.7)
Eosinophils Relative: 4.1 % (ref 0.0–5.0)
HCT: 45 % (ref 36.0–46.0)
Hemoglobin: 14.2 g/dL (ref 12.0–15.0)
Lymphocytes Relative: 22.8 % (ref 12.0–46.0)
Lymphs Abs: 3 10*3/uL (ref 0.7–4.0)
MCHC: 31.6 g/dL (ref 30.0–36.0)
MCV: 87.8 fl (ref 78.0–100.0)
Monocytes Absolute: 0.9 10*3/uL (ref 0.1–1.0)
Monocytes Relative: 7.1 % (ref 3.0–12.0)
Neutro Abs: 8.7 10*3/uL — ABNORMAL HIGH (ref 1.4–7.7)
Neutrophils Relative %: 65.4 % (ref 43.0–77.0)
Platelets: 295 10*3/uL (ref 150.0–400.0)
RBC: 5.12 Mil/uL — ABNORMAL HIGH (ref 3.87–5.11)
RDW: 13.5 % (ref 11.5–15.5)
WBC: 13.3 10*3/uL — ABNORMAL HIGH (ref 4.0–10.5)

## 2023-01-11 LAB — POCT EXHALED NITRIC OXIDE: FeNO level (ppb): 44

## 2023-01-11 MED ORDER — PREDNISONE 10 MG PO TABS
ORAL_TABLET | ORAL | 0 refills | Status: DC
Start: 2023-01-11 — End: 2023-02-20

## 2023-01-11 MED ORDER — FLUTICASONE-SALMETEROL 230-21 MCG/ACT IN AERO
2.0000 | INHALATION_SPRAY | Freq: Two times a day (BID) | RESPIRATORY_TRACT | 12 refills | Status: DC
Start: 2023-01-11 — End: 2023-06-22

## 2023-01-11 MED ORDER — AIRSUPRA 90-80 MCG/ACT IN AERO
1.0000 | INHALATION_SPRAY | Freq: Four times a day (QID) | RESPIRATORY_TRACT | 3 refills | Status: AC | PRN
Start: 2023-01-11 — End: ?

## 2023-01-11 NOTE — Assessment & Plan Note (Addendum)
Severe asthma with mild to moderate obstruction and midflow reversibility. She has had multiple exacerbations over the last 6+ months. She has dyspnea and wheezing. She is responsive to ICS and oral corticosteroids. Trialed off of symbicort but had worsening symptoms and treated for exacerbation at the ED two days later. She appears to be having another flare today with elevated exhaled nitric oxide testing and bronchospasm on exam. I am going to step up her maintenance inhaler to high dose Advair and change her to Airspura for rescue. Depo 80 mg inj x 1 and prednisone taper rx. Check CBC with diff for eosinophils and IgE for allergic phenotype. Not producing enough sputum to collect cultures; she will notify if this changes. Action plan in place.  Patient Instructions  Switch albuterol rescue to AirSupra, which is albuterol and budesonide, 1-2 puffs every 6 hours as needed for shortness of breath or wheezing. This is your new rescue inhaler. Brush tongue and rinse mouth afterwards  Orders sent for albuterol nebulizer solution - 3 mL neb every 6 hours as needed for shortness of breath or wheezing  Restart astelin nasal spray as directed for nasal congestion Restart flonase nasal spray 2 sprays each nostril daily   Stop symbicort. Step up to Advair 230 mcg 2 puffs Twice daily. Brush tongue and rinse mouth afterwards.  Prednisone taper. 4 tabs for 2 days, then 3 tabs for 2 days, 2 tabs for 2 days, then 1 tab for 2 days, then stop. Take in AM with food. Start tomorrow    Follow up in 2 weeks with Dr. Delton Coombes or Philis Nettle. If symptoms do not improve or worsen, please contact office for sooner follow up or seek emergency care
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
## 2023-01-11 NOTE — Assessment & Plan Note (Signed)
No active disease on imaging. Awaiting appt with rheumatology to discuss treatment for latent TB and current immunosuppression therapy.

## 2023-01-11 NOTE — Telephone Encounter (Signed)
Pt calling in to get her prescription sent in for Prednisone taper

## 2023-01-11 NOTE — Assessment & Plan Note (Signed)
Reactive disease in the chest based on her CT scan.  No acute change compared to prior. Lung function has declined when compared to 3 years ago. Diffusing capacity remains normal. See above.

## 2023-01-11 NOTE — Assessment & Plan Note (Signed)
PND possibly contributing to cough; although, she does not think she has significant sinus symptoms. Recommend she restart nasal sprays.

## 2023-01-11 NOTE — Patient Instructions (Addendum)
Switch albuterol rescue to AirSupra, which is albuterol and budesonide, 1-2 puffs every 6 hours as needed for shortness of breath or wheezing. This is your new rescue inhaler. Brush tongue and rinse mouth afterwards  Orders sent for albuterol nebulizer solution - 3 mL neb every 6 hours as needed for shortness of breath or wheezing  Restart astelin nasal spray as directed for nasal congestion Restart flonase nasal spray 2 sprays each nostril daily   Stop symbicort. Step up to Advair 230 mcg 2 puffs Twice daily. Brush tongue and rinse mouth afterwards.  Prednisone taper. 4 tabs for 2 days, then 3 tabs for 2 days, 2 tabs for 2 days, then 1 tab for 2 days, then stop. Take in AM with food. Start tomorrow    Follow up in 2 weeks with Dr. Delton Coombes or Philis Nettle. If symptoms do not improve or worsen, please contact office for sooner follow up or seek emergency care

## 2023-01-12 LAB — IGE: IgE (Immunoglobulin E), Serum: 207 kU/L — ABNORMAL HIGH (ref <=114)

## 2023-01-16 ENCOUNTER — Telehealth (HOSPITAL_BASED_OUTPATIENT_CLINIC_OR_DEPARTMENT_OTHER): Payer: Medicaid Other | Admitting: Psychiatry

## 2023-01-16 ENCOUNTER — Encounter (HOSPITAL_COMMUNITY): Payer: Self-pay | Admitting: Psychiatry

## 2023-01-16 VITALS — Wt 217.0 lb

## 2023-01-16 DIAGNOSIS — F330 Major depressive disorder, recurrent, mild: Secondary | ICD-10-CM | POA: Diagnosis not present

## 2023-01-16 DIAGNOSIS — F419 Anxiety disorder, unspecified: Secondary | ICD-10-CM | POA: Diagnosis not present

## 2023-01-16 DIAGNOSIS — F4312 Post-traumatic stress disorder, chronic: Secondary | ICD-10-CM | POA: Diagnosis not present

## 2023-01-16 MED ORDER — DOXEPIN HCL 75 MG PO CAPS: 75.0000 mg | ORAL_CAPSULE | Freq: Every day | ORAL | 2 refills | Status: DC

## 2023-01-16 MED ORDER — SERTRALINE HCL 50 MG PO TABS: ORAL_TABLET | ORAL | 2 refills | Status: DC
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
## 2023-01-31 ENCOUNTER — Telehealth (HOSPITAL_COMMUNITY): Payer: Self-pay

## 2023-01-31 NOTE — Progress Notes (Signed)
Please notify patient that her IgE and eosinophil counts are elevated, consistent with eosinophilic asthma with allergic phenotype. We can discuss further at her f/u. We will also plan to repeat her CBC to ensure her white blood cell count has trended down. Thanks
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
## 2023-02-06 ENCOUNTER — Telehealth (HOSPITAL_COMMUNITY): Payer: Self-pay | Admitting: *Deleted

## 2023-02-06 NOTE — Telephone Encounter (Signed)
Writer spoke with pt who called on 01/31/23 regarding her request to increase the Zoloft to 100 mg. Pt was advised that Dr. Lolly Mustache wants pt to f/u on therapy referrals instead of increasing Zoloft. Pt acknowledges she received referrals and verbalizes understanding.

## 2023-02-16 ENCOUNTER — Other Ambulatory Visit (HOSPITAL_COMMUNITY): Payer: Self-pay | Admitting: Psychiatry

## 2023-02-16 DIAGNOSIS — F330 Major depressive disorder, recurrent, mild: Secondary | ICD-10-CM

## 2023-02-16 DIAGNOSIS — F419 Anxiety disorder, unspecified: Secondary | ICD-10-CM

## 2023-02-20 ENCOUNTER — Encounter: Payer: Self-pay | Admitting: Nurse Practitioner

## 2023-02-20 ENCOUNTER — Ambulatory Visit (INDEPENDENT_AMBULATORY_CARE_PROVIDER_SITE_OTHER): Payer: Medicaid Other | Admitting: Nurse Practitioner

## 2023-02-20 VITALS — BP 122/78 | HR 101 | Ht 62.0 in | Wt 216.4 lb

## 2023-02-20 DIAGNOSIS — J4550 Severe persistent asthma, uncomplicated: Secondary | ICD-10-CM | POA: Diagnosis not present

## 2023-02-20 DIAGNOSIS — M05771 Rheumatoid arthritis with rheumatoid factor of right ankle and foot without organ or systems involvement: Secondary | ICD-10-CM

## 2023-02-20 DIAGNOSIS — R7612 Nonspecific reaction to cell mediated immunity measurement of gamma interferon antigen response without active tuberculosis: Secondary | ICD-10-CM

## 2023-02-20 DIAGNOSIS — D869 Sarcoidosis, unspecified: Secondary | ICD-10-CM

## 2023-02-20 NOTE — Patient Instructions (Addendum)
Continue AirSupra, which is albuterol and budesonide, 1-2 puffs every 6 hours as needed for shortness of breath or wheezing. This is your new rescue inhaler. Brush tongue and rinse mouth afterwards  Continue albuterol nebulizer solution - 3 mL neb every 6 hours as needed for shortness of breath or wheezing  Continue astelin nasal spray as directed for nasal congestion Continue flonase nasal spray 2 sprays each nostril daily  Continue Advair 230 mcg 2 puffs Twice daily. Brush tongue and rinse mouth afterwards.    So happy you are doing better!!   Take a daily allergy pill such as claritin, zyrtec, xyzal or allegra during fall and spring allergy seasons   Follow up in 4 months with Dr. Delton Coombes or Katie Dvora Buitron,NP. If symptoms do not improve or worsen, please contact office for sooner follow up or seek emergency care

## 2023-02-20 NOTE — Progress Notes (Unsigned)
@Patient  ID: Anna Lucero, female    DOB: 1976-06-14, 47 y.o.   MRN: [id number]  Chief Complaint  Patient presents with   Follow-up    Pt states she has been doing good no concerns.     Referring provider: Larkin Ina  HPI: 47 year old female, never smoker followed for sarcoidosis and asthma. She is a patient of Dr. Kavin Leech and last seen in office 01/11/2023 by [facility] NP. Past medical history significant for GERD, PCOS, PTSD, obesity. She has rheumatoid arthritis, followed by rheumatology, on Orencia injections.   TEST/EVENTS:  04/26/2019 PFT: FVC 88, FEV1 92, ratio 87, TLC 82, DLCOunc 90. No BD; did have some midflow reversibility  07/19/2019 CT chest wo contrast: Resolved LAD. Residual thickening of the inferior left major fissure.  05/19/2022 CXR: there is minimal scarring in the lingula, unchanged. Otherwise, lungs are clear.  10/06/2022 CXR: mild peribronchial thickening 01/11/2023 FeNO 44 ppb  05/25/2022: OV with Ahmet Schank NP for acute visit. Around two weeks ago, she developed URI and productive cough with yellow sputum. She was evaluated by her PCP on 10/11 and treated for sinusitis with augmentin course and supportive care. She then went to the ED on 10/19 with increased cough and SOB. She left because the wait was too long. Had a televisit with her PCP on 10/20. Started on prednisone and cough syrup. CXR was without any acute process. Treated for Acute bronchitis related to recent URI. Bronchospasm on exam. Depo inj 80 mg x 1. Recommended extending prednisone taper. Start ICS/LABA; can transition to PRN once symptoms resolve. She has been appropriately covered from an antimicrobial standpoint with augmentin.    06/08/2022: OV with Caleah Tortorelli NP for follow up. She was initially feeling better when she was on the higher doses of steroids but over the past few days to a week, she's started coughing more and noticed more wheezing/chest tightness. The cough is paroxysmal at times. Minimally  productive with clear to white sputum. Does get some throat tickle and urge to clear her throat. Breathing feels about the same. Denies any fevers, chills, hemoptysis, headaches, sore throat, leg swelling, orthopnea. No interim sick exposures. She has ran out of tessalon and the cough syrup she was on. Using symbicort Twice daily. Eating and drinking well.   10/07/2022: OV with Lareta Bruneau NP for acute visit. She was treated with prednisone taper, cough control measures and started on high dose Symbicort at our last visit. Her cough improved and she was feeling better. Not noticing much wheezing up until the end of January. She called back in on 1/30 with reports of productive cough and wheezing for a week. She was treated for bronchitis with z pack and prednisone taper. Advised on cough control regimen. Advised to keep follow up appt 2/7, which she ended up having to reschedule. Today, she tells me that she was feeling better after the steroids/abx, but over the past 2-3 weeks, she has started coughing again and wheezing more. Cough is occasionally productive with white sputum but usually dry. She feels slightly more short winded, mainly with coughing spells. She has chest tightness. Mild nasal congestion but no significant drainage. She denies any interim sick exposures. Denies fevers, chills, hemoptysis, leg swelling, night sweats, orthopnea. No skin lesions or visual changes. She stopped using the Symbicort sometime early January because she was feeling better. Receives benefit from albuterol. She is still using promethazine DM cough syrup at night, which helps calm her cough so she can sleep.  10/24/2022: OV with Cicely Ortner NP for follow up after being treated for an asthma exacerbation. Felt this was not related to her sarcoid as imaging had been without infiltrates on numerous occasions and presentation was more consistent with asthmatic flare. Since she was here last, she went to urgent care on 3/17 due to wheezing.  She was treated with steroid injection and prednisone burst. She is feeling better from an asthma standpoint. Feels like her breathing is finally improving. Her cough is mostly resolved. She still notices an occasional wheeze at night but overall, sounds much better. No skin lesions, visual changes. She was diagnosed with conjunctivitis yesterday at Urgent Care. She started abx drops. She had similar symptoms a few months ago. She has not been back to see her eye doctor to evaluate this further. Denies any pain or drainage.    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 She actually had two positive tests as they repeated the test to make sure it was accurate. This was earlier this month. She has not heard anything else from them regarding next steps. Of note, she did have a negative quantiferon gold test in January with her rheumatologist. She denies any hemoptysis, fevers, chills, night sweats, weight loss, anorexia. No known exposures. 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   11/15/2022: OV with Dr. Delton Coombes. New positive QuantiFERON gold that was done at [facility]. CT does not show any active disease. Could consider further testing with sputum culture for AFB or BAL in the future. 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 She has chronic cough and upper airway symptoms. Unclear asthma at present. Plan to repeat PFTs. Ok to stop [employer].   01/11/2023: Ov with Teigen Bellin NP for follow up to discuss PFTs. 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 She had hyperinflation, consistent with air trapping, and elevated diffusing capacity. No significant bronchodilator response in FEV1 but did have midflow reversibility. After she was here last, she stopped the Symbicort but had worsening wheezing and shortness of breath.  She went to the ED on 4/18 and was treated for asthma exacerbation with steroids and nebs. She was advised to go back on her Symbicort. She was doing well up until 2 days ago. Again, started having more shortness of breath and wheezing. She's coughing some; primarily dry or minimal amount of clear sputum. She does have some postnasal drainage at times but doesn't feel like her sinuses are congestion. She stopped using her sinus sprays. She denies any fevers, chills, hemoptysis, leg swelling, orthopnea, chest pain. She has been using her rescue more. She has an upcoming appt with rheumatology to discuss her latent TB diagnosis and potential treatment.   02/20/2023: Today - follow up Presents today for follow-up.  She was treated for asthmatic bronchitis at her last visit with elevated nitric oxide testing.  She was restarted on high-dose Advair and provided with air supra for rescue.  She also completed a prednisone taper.  She is feeling significantly better.  She feels like her symptoms have entirely resolved since she restarted the inhaler and has been using it consistently.  Feels like her breathing has been doing well.  She has not had any wheezing.  Her cough is gone.  Has only had to use her rescue inhaler once when she first  got it.  Has not had to use it since.  She did have elevated eosinophils to 500 and IgE of 207 on previous testing.  She denies any chest congestion, fevers, chills, night sweats, weight loss, anorexia.  Her most recent QuantiFERON gold was negative.  She is going to be started on Orencia with rheumatology.  Having a lot of joint aches/pains and popping of her joints.  Allergies  Allergen Reactions   Infliximab Itching and Shortness Of Breath    Patient was premedicated   Patient was premedicated      Lamictal [Lamotrigine]     Skin rash, likely not SJS but concerning   Celebrex [Celecoxib] Other (See Comments)    Chest pain   Hydrocodone-Acetaminophen Other (See Comments)    Spironolactone Other (See Comments)    Bleeding    Immunization History  Administered Date(s) Administered   Influenza Inj Mdck Quad With Preservative 09/29/2017   Influenza Split 05/01/2018   Influenza, Seasonal, Injecte, Preservative Fre 09/29/2017   Influenza,inj,Quad PF,6+ Mos 06/08/2022   Influenza,trivalent, recombinat, inj, PF 05/01/2018   Influenza-Unspecified 09/29/2017   Moderna Sars-Covid-2 Vaccination 05/22/2020, 06/24/2020   PPD Test 10/01/2018   Tdap 06/01/2017    Past Medical History:  Diagnosis Date   Anemia    Anxiety    Bronchitis    Depression    GERD (gastroesophageal reflux disease)    Gestational diabetes    Iritis    PCOS (polycystic ovarian syndrome)    RA (rheumatoid arthritis) (HCC)     Tobacco History: Social History   Tobacco Use  Smoking Status Never  Smokeless Tobacco Never   Counseling given: Not Answered   Outpatient Medications Prior to Visit  Medication Sig Dispense Refill   Albuterol-Budesonide (AIRSUPRA) 90-80 MCG/ACT AERO Inhale 1-2 puffs into the lungs every 6 (six) hours as needed (shortness of breath, wheezing). 10.7 g 3   amLODipine (NORVASC) 5 MG tablet Take by mouth.     benzonatate (TESSALON) 200 MG capsule Take 1 capsule (200 mg total) by mouth 3 (three) times daily as needed for cough. 30 capsule 1   Cholecalciferol 1.25 MG (50000 UT) capsule Take 1 capsule by mouth once a week.     doxepin (SINEQUAN) 75 MG capsule Take 1 capsule (75 mg total) by mouth at bedtime. 30 capsule 2   fluticasone (FLONASE) 50 MCG/ACT nasal spray Place 2 sprays into both nostrils daily. 18.2 mL 2   fluticasone-salmeterol (ADVAIR HFA) 230-21 MCG/ACT inhaler Inhale 2 puffs into the lungs 2 (two) times daily. 1 each 12   omeprazole (PRILOSEC) 40 MG capsule TAKE 1 CAPSULE(40 MG) BY MOUTH IN THE MORNING AND AT BEDTIME 60 capsule 11   rosuvastatin (CRESTOR) 20 MG tablet      sertraline (ZOLOFT) 50 MG tablet Take one tab daily 30 tablet 2    abatacept (ORENCIA) 250 MG injection Inject into the vein.     acetaminophen (TYLENOL) 500 MG tablet Take 2 tablets (1,000 mg total) by mouth every 6 (six) hours as needed for mild pain or moderate pain. (Patient not taking: Reported on 02/20/2023) 30 tablet 0   cyclobenzaprine (FLEXERIL) 10 MG tablet Take 1 tablet (10 mg total) by mouth 2 (two) times daily as needed for muscle spasms. (Patient not taking: Reported on 02/20/2023) 20 tablet 0   methocarbamol (ROBAXIN) 500 MG tablet Take 500 mg by mouth 4 (four) times daily. (Patient not taking: Reported on 02/20/2023)     predniSONE (DELTASONE) 10 MG tablet 4 tabs for  2 days, then 3 tabs for 2 days, 2 tabs for 2 days, then 1 tab for 2 days, then stop (Patient not taking: Reported on 02/20/2023) 20 tablet 0   No facility-administered medications prior to visit.     Review of Systems:   Constitutional: No weight loss or gain, night sweats, fevers, chills, or lassitude. +fatigue (baseline) HEENT: No headaches, difficulty swallowing, tooth/dental problems, or sore throat. No sneezing, itching, ear ache, nasal congestion. +occasional postnasal drainage  CV:  No chest pain, orthopnea, PND, swelling in lower extremities, anasarca, dizziness, palpitations, syncope Resp: No shortness of breath with exertion or rest. No cough. No wheezing. No hemoptysis. No chest wall deformity GI:  No heartburn, indigestion, abdominal pain, nausea, vomiting, diarrhea, change in bowel habits, loss of appetite, bloody stools.  Skin: No rash, lesions, ulcerations MSK:  +joint pains. No back pain. Neuro: No dizziness or lightheadedness.  Psych: No depression or anxiety. Mood stable.     Physical Exam:  BP 122/78   Pulse (!) 101   Ht 5\' 2"  (1.575 m)   Wt 216 lb 6.4 oz (98.2 kg)   LMP 03/20/2020   SpO2 97%   BMI 39.58 kg/m   GEN: Pleasant, interactive, well-appearing; in no acute distress HEENT:  Normocephalic and atraumatic. PERRLA. Sclera white. Nasal turbinates  pink, moist and patent bilaterally. No rhinorrhea present. Oropharynx pink and moist, without exudate or edema. No lesions, ulcerations NECK:  Supple w/ fair ROM. No JVD present. Normal carotid impulses w/o bruits. Thyroid symmetrical with no goiter or nodules palpated. No LAD. CV: RRR, no m/r/g, no peripheral edema. Pulses intact, +2 bilaterally. No cyanosis, pallor or clubbing. PULMONARY:  Unlabored, regular breathing. Clear bilaterally A&P w/o wheezes/rales/rhonchi. No accessory muscle use.  GI: BS present and normoactive. Soft, non-tender to palpation. No organomegaly or masses detected. MSK: No erythema, warmth or tenderness. No deformities or joint swelling noted.  Neuro: A/Ox3. No focal deficits noted.   Skin: Warm, no lesions or rashe Psych: Normal affect and behavior. Judgement and thought content appropriate.     Lab Results:  CBC    Component Value Date/Time   WBC 13.3 (H) 01/11/2023 1143   RBC 5.12 (H) 01/11/2023 1143   HGB 14.2 01/11/2023 1143   HCT 45.0 01/11/2023 1143   PLT 295.0 01/11/2023 1143   MCV 87.8 01/11/2023 1143   MCH 27.8 05/19/2022 2147   MCHC 31.6 01/11/2023 1143   RDW 13.5 01/11/2023 1143   LYMPHSABS 3.0 01/11/2023 1143   MONOABS 0.9 01/11/2023 1143   EOSABS 0.5 01/11/2023 1143   BASOSABS 0.1 01/11/2023 1143    BMET    Component Value Date/Time   NA 137 10/24/2022 1549   K 3.6 10/24/2022 1549   CL 104 10/24/2022 1549   CO2 23 10/24/2022 1549   GLUCOSE 115 (H) 10/24/2022 1549   BUN 17 10/24/2022 1549   CREATININE 0.88 10/24/2022 1549   CALCIUM 9.6 10/24/2022 1549   GFRNONAA >60 05/19/2022 2147   GFRAA >60 01/18/2019 0411    BNP    Component Value Date/Time   BNP 34.2 01/12/2019 1855     Imaging:  No results found.  Administration History     None           Latest Ref Rng & Units 12/20/2022    1:44 PM 04/26/2019   12:54 PM  PFT Results  FVC-Pre L 2.68  2.68  P  FVC-Predicted Pre % 77  88  P  FVC-Post L 2.70  2.80  P   FVC-Predicted Post % 77  92  P  Pre FEV1/FVC % % 80  85  P  Post FEV1/FCV % % 87  87  P  FEV1-Pre L 2.15  2.28  P  FEV1-Predicted Pre % 77  92  P  FEV1-Post L 2.34  2.44  P  DLCO uncorrected ml/min/mmHg 22.94  19.56  P  DLCO UNC% % 111  90  P  DLCO corrected ml/min/mmHg 22.94    DLCO COR %Predicted % 111    DLVA Predicted % 124  116  P  TLC L 5.73  4.15  P  TLC % Predicted % 116  82  P  RV % Predicted % 151  85  P    P Preliminary result    No results found for: "NITRICOXIDE"      Assessment & Plan:   Severe persistent asthma Clinically improved since prednisone and reinitiating maintenance with high dose ICS/LABA. She appears compensated on this regimen. If she has an additional flare, should consider singulair and/or biologic therapy. Action plan in place.   Patient Instructions  Continue AirSupra, which is albuterol and budesonide, 1-2 puffs every 6 hours as needed for shortness of breath or wheezing. This is your new rescue inhaler. Brush tongue and rinse mouth afterwards  Continue albuterol nebulizer solution - 3 mL neb every 6 hours as needed for shortness of breath or wheezing  Continue astelin nasal spray as directed for nasal congestion Continue flonase nasal spray 2 sprays each nostril daily  Continue Advair 230 mcg 2 puffs Twice daily. Brush tongue and rinse mouth afterwards.    So happy you are doing better!!   Take a daily allergy pill such as claritin, zyrtec, xyzal or allegra during fall and spring allergy seasons   Follow up in 4 months with Dr. Delton Coombes or Katie Toshiyuki Fredell,NP. If symptoms do not improve or worsen, please contact office for sooner follow up or seek emergency care   Sarcoid Stable on imaging. See above  Rheumatoid arthritis with positive rheumatoid factor (HCC) Follow up with rheumatology as scheduled  Positive QuantiFERON-TB Gold test No evidence of active infection on imaging. Most recent was negative drawn by rheum    I spent 25  minutes of dedicated to the care of this patient on the date of this encounter to include pre-visit review of records, face-to-face time with the patient discussing conditions above, post visit ordering of testing, clinical documentation with the electronic health record, making appropriate referrals as documented, and communicating necessary findings to members of the patients care team.  Noemi Chapel, NP 02/21/2023  Pt aware and understands NP's role.

## 2023-02-21 ENCOUNTER — Encounter: Payer: Self-pay | Admitting: Nurse Practitioner

## 2023-02-21 NOTE — Assessment & Plan Note (Signed)
Clinically improved since prednisone and reinitiating maintenance with high dose ICS/LABA. She appears compensated on this regimen. If she has an additional flare, should consider singulair and/or biologic therapy. Action plan in place.   Patient Instructions  Continue AirSupra, which is albuterol and budesonide, 1-2 puffs every 6 hours as needed for shortness of breath or wheezing. This is your new rescue inhaler. Brush tongue and rinse mouth afterwards  Continue albuterol nebulizer solution - 3 mL neb every 6 hours as needed for shortness of breath or wheezing  Continue astelin nasal spray as directed for nasal congestion Continue flonase nasal spray 2 sprays each nostril daily  Continue Advair 230 mcg 2 puffs Twice daily. Brush tongue and rinse mouth afterwards.    So happy you are doing better!!   Take a daily allergy pill such as claritin, zyrtec, xyzal or allegra during fall and spring allergy seasons   Follow up in 4 months with Dr. Delton Coombes or Katie Arnesia Vincelette,NP. If symptoms do not improve or worsen, please contact office for sooner follow up or seek emergency care

## 2023-02-21 NOTE — Assessment & Plan Note (Addendum)
No evidence of active infection on imaging. Most recent was negative drawn by rheum

## 2023-02-21 NOTE — Assessment & Plan Note (Signed)
Stable on imaging. See above

## 2023-02-21 NOTE — Assessment & Plan Note (Signed)
Follow-up with rheumatology as scheduled. 

## 2023-03-08 ENCOUNTER — Encounter: Payer: Self-pay | Admitting: Orthopedic Surgery

## 2023-03-08 ENCOUNTER — Ambulatory Visit (INDEPENDENT_AMBULATORY_CARE_PROVIDER_SITE_OTHER): Payer: Medicaid Other | Admitting: Orthopedic Surgery

## 2023-03-08 DIAGNOSIS — M1711 Unilateral primary osteoarthritis, right knee: Secondary | ICD-10-CM | POA: Diagnosis not present

## 2023-03-08 DIAGNOSIS — M25561 Pain in right knee: Secondary | ICD-10-CM

## 2023-03-08 MED ORDER — BUPIVACAINE HCL 0.25 % IJ SOLN
4.0000 mL | INTRAMUSCULAR | Status: AC | PRN
Start: 2023-03-08 — End: 2023-03-08
  Administered 2023-03-08: 4 mL via INTRA_ARTICULAR

## 2023-03-08 MED ORDER — METHYLPREDNISOLONE ACETATE 40 MG/ML IJ SUSP
40.0000 mg | INTRAMUSCULAR | Status: AC | PRN
Start: 2023-03-08 — End: 2023-03-08
  Administered 2023-03-08: 40 mg via INTRA_ARTICULAR

## 2023-03-08 MED ORDER — LIDOCAINE HCL 1 % IJ SOLN
5.0000 mL | INTRAMUSCULAR | Status: AC | PRN
Start: 2023-03-08 — End: 2023-03-08
  Administered 2023-03-08: 5 mL
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
## 2023-03-30 ENCOUNTER — Encounter: Payer: Self-pay | Admitting: Orthopedic Surgery

## 2023-04-05 ENCOUNTER — Other Ambulatory Visit: Payer: Medicaid Other

## 2023-04-17 ENCOUNTER — Encounter (HOSPITAL_COMMUNITY): Payer: Self-pay

## 2023-04-17 ENCOUNTER — Telehealth (HOSPITAL_COMMUNITY): Payer: Medicaid Other | Admitting: Psychiatry

## 2023-04-17 NOTE — Progress Notes (Signed)
No show

## 2023-05-18 ENCOUNTER — Telehealth: Payer: Self-pay | Admitting: Nurse Practitioner

## 2023-05-18 NOTE — Telephone Encounter (Signed)
Patient states currently taking Advair inhaler. States mouth is burning. Also have sores on gums. Pharmacy is [facility]. Patient phone number is [phone].

## 2023-05-18 NOTE — Telephone Encounter (Signed)
Lm x1 for patient.  

## 2023-05-18 NOTE — Telephone Encounter (Signed)
Spoke with the pt  She thinks she may have thrush  Her gums are sore, mouth is sore in general  She does rinse her mouth and gargle, bush teeth/tongue after she uses advair  She wonders if we could call in something to help with symptoms and also if using a spacer would help  Please advise, thanks!

## 2023-05-18 NOTE — Telephone Encounter (Signed)
Agree with spacer Also, ok to give her nystatin S&S gargle 15cc tid x 3 days

## 2023-05-18 NOTE — Telephone Encounter (Signed)
Pt returning missed call. 

## 2023-05-19 MED ORDER — NYSTATIN 100000 UNIT/ML MT SUSP: 15.0000 mL | Freq: Three times a day (TID) | OROMUCOSAL | 0 refills | Status: AC

## 2023-05-19 NOTE — Telephone Encounter (Signed)
Pt aware of rx being sent and this was sent to her preferred pharm Nothing further needed

## 2023-06-20 ENCOUNTER — Telehealth: Payer: Self-pay | Admitting: Student

## 2023-06-20 NOTE — Telephone Encounter (Signed)
Pt needs Advair refill.  [facility].   Has sched upcoming appt. Her # is [phone]

## 2023-06-22 ENCOUNTER — Encounter: Payer: Self-pay | Admitting: Nurse Practitioner

## 2023-06-22 ENCOUNTER — Ambulatory Visit: Payer: PRIVATE HEALTH INSURANCE | Admitting: Nurse Practitioner

## 2023-06-22 VITALS — BP 110/70 | HR 89 | Ht 62.0 in | Wt 215.2 lb

## 2023-06-22 DIAGNOSIS — J209 Acute bronchitis, unspecified: Secondary | ICD-10-CM

## 2023-06-22 DIAGNOSIS — D869 Sarcoidosis, unspecified: Secondary | ICD-10-CM | POA: Diagnosis not present

## 2023-06-22 DIAGNOSIS — M05771 Rheumatoid arthritis with rheumatoid factor of right ankle and foot without organ or systems involvement: Secondary | ICD-10-CM

## 2023-06-22 DIAGNOSIS — J310 Chronic rhinitis: Secondary | ICD-10-CM

## 2023-06-22 DIAGNOSIS — J4550 Severe persistent asthma, uncomplicated: Secondary | ICD-10-CM | POA: Diagnosis not present

## 2023-06-22 MED ORDER — FLUTICASONE-SALMETEROL 230-21 MCG/ACT IN AERO
2.0000 | INHALATION_SPRAY | Freq: Two times a day (BID) | RESPIRATORY_TRACT | 12 refills | Status: AC
Start: 2023-06-22 — End: ?

## 2023-06-22 MED ORDER — FLUTICASONE PROPIONATE 50 MCG/ACT NA SUSP: 2.0000 | Freq: Every day | NASAL | 2 refills | Status: DC

## 2023-06-22 NOTE — Patient Instructions (Addendum)
Continue AirSupra, which is albuterol and budesonide, 1-2 puffs every 6 hours as needed for shortness of breath or wheezing. This is your new rescue inhaler. Brush tongue and rinse mouth afterwards  Continue albuterol nebulizer solution - 3 mL neb every 6 hours as needed for shortness of breath or wheezing  Continue astelin nasal spray as directed for nasal congestion Continue flonase nasal spray 2 sprays each nostril daily  Continue Advair 230 mcg 2 puffs Twice daily. Brush tongue and rinse mouth afterwards.   Use Breztri 2 puffs Twice daily until you get your Advair back then stop the [place] and keep using your Advair. Brush tongue and rinse mouth afterwards    Take a daily allergy pill such as claritin, zyrtec, xyzal or allegra during fall and spring allergy seasons   Follow up in 4 months with Dr. Delton Coombes or Katie Jadien Lehigh,NP. If symptoms do not improve or worsen, please contact office for sooner follow up or seek emergency care

## 2023-06-22 NOTE — Assessment & Plan Note (Signed)
Stable on current regimen. No acute worsening.

## 2023-06-22 NOTE — Assessment & Plan Note (Signed)
Suspect increased dyspnea and chest tightness related to lack of controller therapy.  Lung exam was clear today.  Does not appear to be in acute exacerbation so would hold off on oral corticosteroids at this point.  We will provide her with a sample of Breztri to use while she waits on her Advair in the mail.  Educated on proper use.  Teach back performed.  Side effect profile reviewed.  Advair and Flonase Rx updated to her mail-in pharmacy.  Advised her to contact them if she has not heard anything in the next few days.  Encouraged to remain active.  Trigger prevention.  Action plan in place.  Patient Instructions  Continue AirSupra, which is albuterol and budesonide, 1-2 puffs every 6 hours as needed for shortness of breath or wheezing. This is your new rescue inhaler. Brush tongue and rinse mouth afterwards  Continue albuterol nebulizer solution - 3 mL neb every 6 hours as needed for shortness of breath or wheezing  Continue astelin nasal spray as directed for nasal congestion Continue flonase nasal spray 2 sprays each nostril daily  Continue Advair 230 mcg 2 puffs Twice daily. Brush tongue and rinse mouth afterwards.   Use Breztri 2 puffs Twice daily until you get your Advair back then stop the [place] and keep using your Advair. Brush tongue and rinse mouth afterwards    Take a daily allergy pill such as claritin, zyrtec, xyzal or allegra during fall and spring allergy seasons   Follow up in 4 months with Dr. Delton Coombes or Katie Steffan Caniglia,NP. If symptoms do not improve or worsen, please contact office for sooner follow up or seek emergency care

## 2023-06-22 NOTE — Assessment & Plan Note (Signed)
Follow-up with rheumatology as scheduled. 

## 2023-06-22 NOTE — Assessment & Plan Note (Signed)
Stable on imaging. See above

## 2023-06-22 NOTE — Progress Notes (Signed)
@Patient  ID: Anna Lucero, female    DOB: 08-Aug-1975, 47 y.o.   MRN: [id number]  Chief Complaint  Patient presents with   Follow-up    Asthma F/U visit    Referring provider: Larkin Ina  HPI: 47 year old female, never smoker followed for sarcoidosis and asthma. She is a patient of Dr. Kavin Leech and last seen in office 02/20/2023 by [facility] NP. Past medical history significant for GERD, PCOS, PTSD, obesity. She has rheumatoid arthritis, followed by rheumatology, on Orencia injections.   TEST/EVENTS:  04/26/2019 PFT: FVC 88, FEV1 92, ratio 87, TLC 82, DLCOunc 90. No BD; did have some midflow reversibility  07/19/2019 CT chest wo contrast: Resolved LAD. Residual thickening of the inferior left major fissure.  05/19/2022 CXR: there is minimal scarring in the lingula, unchanged. Otherwise, lungs are clear.  10/06/2022 CXR: mild peribronchial thickening 01/11/2023 FeNO 44 ppb  05/25/2022: OV with Keierra Nudo NP for acute visit. Around two weeks ago, she developed URI and productive cough with yellow sputum. She was evaluated by her PCP on 10/11 and treated for sinusitis with augmentin course and supportive care. She then went to the ED on 10/19 with increased cough and SOB. She left because the wait was too long. Had a televisit with her PCP on 10/20. Started on prednisone and cough syrup. CXR was without any acute process. Treated for Acute bronchitis related to recent URI. Bronchospasm on exam. Depo inj 80 mg x 1. Recommended extending prednisone taper. Start ICS/LABA; can transition to PRN once symptoms resolve. She has been appropriately covered from an antimicrobial standpoint with augmentin.    06/08/2022: OV with Emunah Texidor NP for follow up. She was initially feeling better when she was on the higher doses of steroids but over the past few days to a week, she's started coughing more and noticed more wheezing/chest tightness. The cough is paroxysmal at times. Minimally productive with clear to white  sputum. Does get some throat tickle and urge to clear her throat. Breathing feels about the same. Denies any fevers, chills, hemoptysis, headaches, sore throat, leg swelling, orthopnea. No interim sick exposures. She has ran out of tessalon and the cough syrup she was on. Using symbicort Twice daily. Eating and drinking well.   10/07/2022: OV with Adelin Ventrella NP for acute visit. She was treated with prednisone taper, cough control measures and started on high dose Symbicort at our last visit. Her cough improved and she was feeling better. Not noticing much wheezing up until the end of January. She called back in on 1/30 with reports of productive cough and wheezing for a week. She was treated for bronchitis with z pack and prednisone taper. Advised on cough control regimen. Advised to keep follow up appt 2/7, which she ended up having to reschedule. Today, she tells me that she was feeling better after the steroids/abx, but over the past 2-3 weeks, she has started coughing again and wheezing more. Cough is occasionally productive with white sputum but usually dry. She feels slightly more short winded, mainly with coughing spells. She has chest tightness. Mild nasal congestion but no significant drainage. She denies any interim sick exposures. Denies fevers, chills, hemoptysis, leg swelling, night sweats, orthopnea. No skin lesions or visual changes. She stopped using the Symbicort sometime early January because she was feeling better. Receives benefit from albuterol. She is still using promethazine DM cough syrup at night, which helps calm her cough so she can sleep.   10/24/2022: OV with Khadijatou Borak NP  for follow up after being treated for an asthma exacerbation. Felt this was not related to her sarcoid as imaging had been without infiltrates on numerous occasions and presentation was more consistent with asthmatic flare. Since she was here last, she went to urgent care on 3/17 due to wheezing. She was treated with steroid  injection and prednisone burst. She is feeling better from an asthma standpoint. Feels like her breathing is finally improving. Her cough is mostly resolved. She still notices an occasional wheeze at night but overall, sounds much better. No skin lesions, visual changes. She was diagnosed with conjunctivitis yesterday at Urgent Care. She started abx drops. She had similar symptoms a few months ago. She has not been back to see her eye doctor to evaluate this further. Denies any pain or drainage.    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 She actually had two positive tests as they repeated the test to make sure it was accurate. This was earlier this month. She has not heard anything else from them regarding next steps. Of note, she did have a negative quantiferon gold test in January with her rheumatologist. She denies any hemoptysis, fevers, chills, night sweats, weight loss, anorexia. No known exposures. 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   11/15/2022: OV with Dr. Delton Coombes. New positive QuantiFERON gold that was done at [facility]. CT does not show any active disease. Could consider further testing with sputum culture for AFB or BAL in the future. 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 She has chronic cough and upper airway symptoms. Unclear asthma at present. Plan to repeat PFTs. Ok to stop [employer].   01/11/2023: Ov with Lajarvis Italiano NP for follow up to discuss PFTs. 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 She had hyperinflation, consistent with air trapping, and elevated diffusing capacity. No significant bronchodilator response in FEV1 but did have midflow reversibility. After she was here last, she stopped the Symbicort but had worsening wheezing and shortness of breath. She went to the ED on 4/18 and  was treated for asthma exacerbation with steroids and nebs. She was advised to go back on her Symbicort. She was doing well up until 2 days ago. Again, started having more shortness of breath and wheezing. She's coughing some; primarily dry or minimal amount of clear sputum. She does have some postnasal drainage at times but doesn't feel like her sinuses are congestion. She stopped using her sinus sprays. She denies any fevers, chills, hemoptysis, leg swelling, orthopnea, chest pain. She has been using her rescue more. She has an upcoming appt with rheumatology to discuss her latent TB diagnosis and potential treatment.   02/20/2023: OV with Bernetta Sutley NP for follow-up.  She was treated for asthmatic bronchitis at her last visit with elevated nitric oxide testing.  She was restarted on high-dose Advair and provided with air supra for rescue.  She also completed a prednisone taper.  She is feeling significantly better.  She feels like her symptoms have entirely resolved since she restarted the inhaler and has been using it consistently.  Feels like her breathing has been doing well.  She has not had any wheezing.  Her cough is gone.  Has only had to use her rescue inhaler once when she first got it.  Has not had to  use it since.  She did have elevated eosinophils to 500 and IgE of 207 on previous testing.  She denies any chest congestion, fevers, chills, night sweats, weight loss, anorexia.  Her most recent QuantiFERON gold was negative.  She is going to be started on Orencia with rheumatology.  Having a lot of joint aches/pains and popping of her joints.  06/22/2023: Today - follow up Patient presents today for follow up. She was actually doing quite well since our last visit. She feels like the Advair has been controlling her well. She hadn't even needed her rescue inhaler and was having very minimal cough. She then ran out of the Advair 3 days ago due to issues with her insurance and pharmacy requirements. She has  to use their mail in pharmacy now so needs her rx updated. She has felt a little more short winded and chest tightness being off the Advair. She's using her Airsupra 1-2 times a day, which is helping. She denies any wheezing, productive cough, worsening sinus symptoms, leg swelling, fevers, chills.   Allergies  Allergen Reactions   Infliximab Itching and Shortness Of Breath    Patient was premedicated   Patient was premedicated      Lamictal [Lamotrigine]     Skin rash, likely not SJS but concerning   Celebrex [Celecoxib] Other (See Comments)    Chest pain   Hydrocodone-Acetaminophen Other (See Comments)   Spironolactone Other (See Comments)    Bleeding    Immunization History  Administered Date(s) Administered   Influenza Inj Mdck Quad With Preservative 09/29/2017   Influenza Split 05/01/2018   Influenza, Seasonal, Injecte, Preservative Fre 09/29/2017   Influenza,inj,Quad PF,6+ Mos 06/08/2022   Influenza,trivalent, recombinat, inj, PF 05/01/2018   Influenza-Unspecified 09/29/2017   Moderna Sars-Covid-2 Vaccination 05/22/2020, 06/24/2020   PPD Test 10/01/2018   Tdap 06/01/2017    Past Medical History:  Diagnosis Date   Anemia    Anxiety    Bronchitis    Depression    GERD (gastroesophageal reflux disease)    Gestational diabetes    Iritis    PCOS (polycystic ovarian syndrome)    RA (rheumatoid arthritis) (HCC)     Tobacco History: Social History   Tobacco Use  Smoking Status Never  Smokeless Tobacco Never   Counseling given: Not Answered   Outpatient Medications Prior to Visit  Medication Sig Dispense Refill   Albuterol-Budesonide (AIRSUPRA) 90-80 MCG/ACT AERO Inhale 1-2 puffs into the lungs every 6 (six) hours as needed (shortness of breath, wheezing). 10.7 g 3   amLODipine (NORVASC) 5 MG tablet Take by mouth.     benzonatate (TESSALON) 200 MG capsule Take 1 capsule (200 mg total) by mouth 3 (three) times daily as needed for cough. 30 capsule 1    Cholecalciferol 1.25 MG (50000 UT) capsule Take 1 capsule by mouth once a week.     hydrOXYzine (VISTARIL) 25 MG capsule Take 25 mg by mouth at bedtime.     nystatin (MYCOSTATIN) 100000 UNIT/ML suspension Take 15 mLs (1,500,000 Units total) by mouth in the morning, at noon, and at bedtime. 60 mL 0   omeprazole (PRILOSEC) 40 MG capsule TAKE 1 CAPSULE(40 MG) BY MOUTH IN THE MORNING AND AT BEDTIME 60 capsule 11   rosuvastatin (CRESTOR) 20 MG tablet      fluticasone (FLONASE) 50 MCG/ACT nasal spray Place 2 sprays into both nostrils daily. 18.2 mL 2   fluticasone-salmeterol (ADVAIR HFA) 230-21 MCG/ACT inhaler Inhale 2 puffs into the lungs 2 (two) times daily.  1 each 12   doxepin (SINEQUAN) 75 MG capsule Take 1 capsule (75 mg total) by mouth at bedtime. 30 capsule 2   sertraline (ZOLOFT) 50 MG tablet Take one tab daily 30 tablet 2   No facility-administered medications prior to visit.     Review of Systems:   Constitutional: No weight loss or gain, night sweats, fevers, chills, or lassitude. +fatigue (baseline) HEENT: No headaches, difficulty swallowing, tooth/dental problems, or sore throat. No sneezing, itching, ear ache, nasal congestion. +occasional postnasal drainage  CV:  No chest pain, orthopnea, PND, swelling in lower extremities, anasarca, dizziness, palpitations, syncope Resp: +shortness of breath with exertion; occasional dry cough; chest tightness. No wheezing. No hemoptysis. No chest wall deformity GI:  No heartburn, indigestion, abdominal pain, nausea, vomiting, diarrhea, change in bowel habits, loss of appetite, bloody stools.  Skin: No rash, lesions, ulcerations MSK:  +joint pains. No back pain. Neuro: No dizziness or lightheadedness.  Psych: No depression or anxiety. Mood stable.     Physical Exam:  BP 110/70 (BP Location: Right Arm, Cuff Size: Large)   Pulse 89   Ht 5\' 2"  (1.575 m)   Wt 215 lb 3.2 oz (97.6 kg)   LMP 03/20/2020   SpO2 97%   BMI 39.36 kg/m   GEN:  Pleasant, interactive, well-appearing; in no acute distress HEENT:  Normocephalic and atraumatic. PERRLA. Sclera white. Nasal turbinates pink, moist and patent bilaterally. No rhinorrhea present. Oropharynx pink and moist, without exudate or edema. No lesions, ulcerations NECK:  Supple w/ fair ROM. No JVD present. Normal carotid impulses w/o bruits. Thyroid symmetrical with no goiter or nodules palpated. No LAD. CV: RRR, no m/r/g, no peripheral edema. Pulses intact, +2 bilaterally. No cyanosis, pallor or clubbing. PULMONARY:  Unlabored, regular breathing. Clear bilaterally A&P w/o wheezes/rales/rhonchi. No accessory muscle use.  GI: BS present and normoactive. Soft, non-tender to palpation. No organomegaly or masses detected. MSK: No erythema, warmth or tenderness. No deformities or joint swelling noted.  Neuro: A/Ox3. No focal deficits noted.   Skin: Warm, no lesions or rashe Psych: Normal affect and behavior. Judgement and thought content appropriate.     Lab Results:  CBC    Component Value Date/Time   WBC 13.3 (H) 01/11/2023 1143   RBC 5.12 (H) 01/11/2023 1143   HGB 14.2 01/11/2023 1143   HCT 45.0 01/11/2023 1143   PLT 295.0 01/11/2023 1143   MCV 87.8 01/11/2023 1143   MCH 27.8 05/19/2022 2147   MCHC 31.6 01/11/2023 1143   RDW 13.5 01/11/2023 1143   LYMPHSABS 3.0 01/11/2023 1143   MONOABS 0.9 01/11/2023 1143   EOSABS 0.5 01/11/2023 1143   BASOSABS 0.1 01/11/2023 1143    BMET    Component Value Date/Time   NA 137 10/24/2022 1549   K 3.6 10/24/2022 1549   CL 104 10/24/2022 1549   CO2 23 10/24/2022 1549   GLUCOSE 115 (H) 10/24/2022 1549   BUN 17 10/24/2022 1549   CREATININE 0.88 10/24/2022 1549   CALCIUM 9.6 10/24/2022 1549   GFRNONAA >60 05/19/2022 2147   GFRAA >60 01/18/2019 0411    BNP    Component Value Date/Time   BNP 34.2 01/12/2019 1855     Imaging:  No results found.  Administration History     None           Latest Ref Rng & Units  12/20/2022    1:44 PM 04/26/2019   12:54 PM  PFT Results  FVC-Pre L 2.68  2.68  P  FVC-Predicted Pre % 77  88  P  FVC-Post L 2.70  2.80  P  FVC-Predicted Post % 77  92  P  Pre FEV1/FVC % % 80  85  P  Post FEV1/FCV % % 87  87  P  FEV1-Pre L 2.15  2.28  P  FEV1-Predicted Pre % 77  92  P  FEV1-Post L 2.34  2.44  P  DLCO uncorrected ml/min/mmHg 22.94  19.56  P  DLCO UNC% % 111  90  P  DLCO corrected ml/min/mmHg 22.94    DLCO COR %Predicted % 111    DLVA Predicted % 124  116  P  TLC L 5.73  4.15  P  TLC % Predicted % 116  82  P  RV % Predicted % 151  85  P    P Preliminary result    No results found for: "NITRICOXIDE"      Assessment & Plan:   Severe persistent asthma Suspect increased dyspnea and chest tightness related to lack of controller therapy.  Lung exam was clear today.  Does not appear to be in acute exacerbation so would hold off on oral corticosteroids at this point.  We will provide her with a sample of Breztri to use while she waits on her Advair in the mail.  Educated on proper use.  Teach back performed.  Side effect profile reviewed.  Advair and Flonase Rx updated to her mail-in pharmacy.  Advised her to contact them if she has not heard anything in the next few days.  Encouraged to remain active.  Trigger prevention.  Action plan in place.  Patient Instructions  Continue AirSupra, which is albuterol and budesonide, 1-2 puffs every 6 hours as needed for shortness of breath or wheezing. This is your new rescue inhaler. Brush tongue and rinse mouth afterwards  Continue albuterol nebulizer solution - 3 mL neb every 6 hours as needed for shortness of breath or wheezing  Continue astelin nasal spray as directed for nasal congestion Continue flonase nasal spray 2 sprays each nostril daily  Continue Advair 230 mcg 2 puffs Twice daily. Brush tongue and rinse mouth afterwards.   Use Breztri 2 puffs Twice daily until you get your Advair back then stop the [place] and keep  using your Advair. Brush tongue and rinse mouth afterwards    Take a daily allergy pill such as claritin, zyrtec, xyzal or allegra during fall and spring allergy seasons   Follow up in 4 months with Dr. Delton Coombes or Katie Arney Mayabb,NP. If symptoms do not improve or worsen, please contact office for sooner follow up or seek emergency care   Sarcoid Stable on imaging. See above   Rheumatoid arthritis with positive rheumatoid factor (HCC) Follow up with rheumatology as scheduled   Chronic rhinitis Stable on current regimen. No acute worsening.      I spent 32 minutes of dedicated to the care of this patient on the date of this encounter to include pre-visit review of records, face-to-face time with the patient discussing conditions above, post visit ordering of testing, clinical documentation with the electronic health record, making appropriate referrals as documented, and communicating necessary findings to members of the patients care team.  Noemi Chapel, NP 06/22/2023  Pt aware and understands NP's role.

## 2023-06-23 ENCOUNTER — Ambulatory Visit: Payer: Medicaid Other | Admitting: Nurse Practitioner

## 2023-07-07 ENCOUNTER — Other Ambulatory Visit: Payer: Self-pay | Admitting: Nurse Practitioner

## 2023-07-07 DIAGNOSIS — J209 Acute bronchitis, unspecified: Secondary | ICD-10-CM

## 2023-10-18 ENCOUNTER — Ambulatory Visit: Payer: PRIVATE HEALTH INSURANCE | Admitting: Nurse Practitioner

## 2023-10-18 ENCOUNTER — Encounter: Payer: Self-pay | Admitting: Nurse Practitioner

## 2023-10-20 ENCOUNTER — Ambulatory Visit: Payer: PRIVATE HEALTH INSURANCE | Admitting: Nurse Practitioner

## 2023-12-11 ENCOUNTER — Emergency Department (HOSPITAL_COMMUNITY)

## 2023-12-11 ENCOUNTER — Emergency Department (HOSPITAL_COMMUNITY)
Admission: EM | Admit: 2023-12-11 | Discharge: 2023-12-11 | Disposition: A | Discharge status: Home / Self Care | Attending: Emergency Medicine | Admitting: Emergency Medicine

## 2023-12-11 ENCOUNTER — Other Ambulatory Visit: Payer: Self-pay

## 2023-12-11 ENCOUNTER — Encounter (HOSPITAL_COMMUNITY): Payer: Self-pay | Admitting: Radiology

## 2023-12-11 DIAGNOSIS — R197 Diarrhea, unspecified: Secondary | ICD-10-CM | POA: Insufficient documentation

## 2023-12-11 DIAGNOSIS — R112 Nausea with vomiting, unspecified: Secondary | ICD-10-CM | POA: Insufficient documentation

## 2023-12-11 DIAGNOSIS — R1033 Periumbilical pain: Secondary | ICD-10-CM | POA: Diagnosis present

## 2023-12-11 DIAGNOSIS — R1084 Generalized abdominal pain: Secondary | ICD-10-CM

## 2023-12-11 LAB — CBC WITH DIFFERENTIAL/PLATELET
Abs Immature Granulocytes: 0.1 10*3/uL — ABNORMAL HIGH (ref 0.00–0.07)
Basophils Absolute: 0 10*3/uL (ref 0.0–0.1)
Basophils Relative: 0 %
Eosinophils Absolute: 0.3 10*3/uL (ref 0.0–0.5)
Eosinophils Relative: 1 %
HCT: 40.5 % (ref 36.0–46.0)
Hemoglobin: 13 g/dL (ref 12.0–15.0)
Immature Granulocytes: 1 %
Lymphocytes Relative: 9 %
Lymphs Abs: 1.7 10*3/uL (ref 0.7–4.0)
MCH: 28.1 pg (ref 26.0–34.0)
MCHC: 32.1 g/dL (ref 30.0–36.0)
MCV: 87.5 fL (ref 80.0–100.0)
Monocytes Absolute: 1.1 10*3/uL — ABNORMAL HIGH (ref 0.1–1.0)
Monocytes Relative: 6 %
Neutro Abs: 16.4 10*3/uL — ABNORMAL HIGH (ref 1.7–7.7)
Neutrophils Relative %: 83 %
Platelets: 292 10*3/uL (ref 150–400)
RBC: 4.63 MIL/uL (ref 3.87–5.11)
RDW: 14.3 % (ref 11.5–15.5)
WBC: 19.6 10*3/uL — ABNORMAL HIGH (ref 4.0–10.5)
nRBC: 0 % (ref 0.0–0.2)

## 2023-12-11 LAB — URINALYSIS, W/ REFLEX TO CULTURE (INFECTION SUSPECTED)
Bilirubin Urine: NEGATIVE
Glucose, UA: NEGATIVE mg/dL
Ketones, ur: NEGATIVE mg/dL
Nitrite: NEGATIVE
Protein, ur: 30 mg/dL — AB
Specific Gravity, Urine: 1.026 (ref 1.005–1.030)
pH: 6 (ref 5.0–8.0)

## 2023-12-11 LAB — COMPREHENSIVE METABOLIC PANEL WITH GFR
ALT: 25 U/L (ref 0–44)
AST: 23 U/L (ref 15–41)
Albumin: 3.4 g/dL — ABNORMAL LOW (ref 3.5–5.0)
Alkaline Phosphatase: 72 U/L (ref 38–126)
Anion gap: 11 (ref 5–15)
BUN: 11 mg/dL (ref 6–20)
CO2: 20 mmol/L — ABNORMAL LOW (ref 22–32)
Calcium: 8.9 mg/dL (ref 8.9–10.3)
Chloride: 107 mmol/L (ref 98–111)
Creatinine, Ser: 0.92 mg/dL (ref 0.44–1.00)
GFR, Estimated: >60 mL/min (ref >60)
Glucose, Bld: 114 mg/dL — ABNORMAL HIGH (ref 70–99)
Potassium: 3.9 mmol/L (ref 3.5–5.1)
Sodium: 138 mmol/L (ref 135–145)
Total Bilirubin: 0.6 mg/dL (ref 0.0–1.2)
Total Protein: 7.2 g/dL (ref 6.5–8.1)

## 2023-12-11 LAB — LIPASE, BLOOD: Lipase: 27 U/L (ref 11–51)

## 2023-12-11 MED ORDER — METRONIDAZOLE 500 MG PO TABS: 500.0000 mg | ORAL_TABLET | Freq: Two times a day (BID) | ORAL | 0 refills | Status: AC

## 2023-12-11 MED ORDER — KETOROLAC TROMETHAMINE 15 MG/ML IJ SOLN
15.0000 mg | Freq: Once | INTRAMUSCULAR | Status: DC
  Filled 2023-12-11: qty 1

## 2023-12-11 MED ORDER — FLUCONAZOLE 150 MG PO TABS: 150.0000 mg | ORAL_TABLET | Freq: Once | ORAL | 0 refills | Status: AC

## 2023-12-11 MED ORDER — ONDANSETRON 4 MG PO TBDP
4.0000 mg | ORAL_TABLET | Freq: Three times a day (TID) | ORAL | 0 refills | Status: AC | PRN
Start: 2023-12-11 — End: ?

## 2023-12-11 MED ORDER — IOHEXOL 300 MG/ML  SOLN
100.0000 mL | Freq: Once | INTRAMUSCULAR | Status: AC | PRN
  Administered 2023-12-11: 100 mL via INTRAVENOUS

## 2023-12-11 MED ORDER — SODIUM CHLORIDE 0.9 % IV BOLUS
1000.0000 mL | Freq: Once | INTRAVENOUS | Status: AC
  Administered 2023-12-11: 1000 mL via INTRAVENOUS

## 2023-12-11 MED ORDER — ONDANSETRON HCL 4 MG/2ML IJ SOLN
4.0000 mg | Freq: Once | INTRAMUSCULAR | Status: AC
  Administered 2023-12-11: 4 mg via INTRAVENOUS
  Filled 2023-12-11: qty 2

## 2023-12-11 MED ORDER — KETOROLAC TROMETHAMINE 15 MG/ML IJ SOLN
15.0000 mg | Freq: Once | INTRAMUSCULAR | Status: AC
  Administered 2023-12-11: 15 mg via INTRAVENOUS

## 2023-12-11 MED ORDER — SODIUM CHLORIDE (PF) 0.9 % IJ SOLN
INTRAMUSCULAR | Status: AC
  Filled 2023-12-11: qty 50

## 2023-12-11 NOTE — Discharge Instructions (Signed)
 Return for any problem.  ?

## 2023-12-11 NOTE — ED Provider Notes (Signed)
 [place] EMERGENCY DEPARTMENT AT [facility] Provider Note   CSN: [id number] Arrival date & time: 12/11/23  0800     History  Chief Complaint  Patient presents with   Abdominal Pain    Pt arrives via ems for abdominal cramping ongoing since Thursday. Pt recently had tooth extracted and was on amoxicillin  course for it. Stopped taking course two days in because she became ill. VSS for EMS. Now reporting n/v/d progressing since then     Anna Lucero is a 48 y.o. female.  48 year old female with prior medical history as detailed below presents for evaluation.  Patient reports ongoing nausea, vomiting, diarrhea, abdominal cramps since Thursday of last week.  Patient reports that over the weekend she had no improvement in symptoms.  She did not trial any medications for pain or for nausea.  She denies fever.  She denies sick contacts.  She denies bloody emesis or bloody stool.  She complains of mid abdominal crampy discomfort associated with her nausea and vomiting and diarrhea.  She describes loose diarrheal bowel movements.  The history is provided by the patient.       Home Medications Prior to Admission medications   Medication Sig Start Date End Date Taking? Authorizing Provider  Albuterol -Budesonide  (AIRSUPRA ) 90-80 MCG/ACT AERO Inhale 1-2 puffs into the lungs every 6 (six) hours as needed (shortness of breath, wheezing). 01/11/23   Cobb, Mariah Shines, NP  amLODipine  (NORVASC ) 5 MG tablet Take by mouth. 04/15/22   [provider]  benzonatate  (TESSALON ) 200 MG capsule Take 1 capsule (200 mg total) by mouth 3 (three) times daily as needed for cough. 10/06/22   Cobb, Mariah Shines, NP  Cholecalciferol 1.25 MG (50000 UT) capsule Take 1 capsule by mouth once a week. 04/25/22   [provider]  fluticasone  (FLONASE ) 50 MCG/ACT nasal spray SPRAY 2 SPRAYS INTO EACH NOSTRIL EVERY DAY 07/11/23   Cobb, Mariah Shines, NP  fluticasone -salmeterol (ADVAIR HFA) 230-21  MCG/ACT inhaler Inhale 2 puffs into the lungs 2 (two) times daily. 06/22/23   Cobb, Mariah Shines, NP  hydrOXYzine (VISTARIL) 25 MG capsule Take 25 mg by mouth at bedtime. 05/25/23   [provider]  nystatin  (MYCOSTATIN ) 100000 UNIT/ML suspension Take 15 mLs (1,500,000 Units total) by mouth in the morning, at noon, and at bedtime. 05/19/23   Denson Flake, MD  omeprazole  (PRILOSEC) 40 MG capsule TAKE 1 CAPSULE(40 MG) BY MOUTH IN THE MORNING AND AT BEDTIME 07/27/21   Byrum, Robert S, MD  rosuvastatin (CRESTOR) 20 MG tablet  03/16/21   Weber, Clance Crigler, PA-C      Allergies    Infliximab, Lamictal [lamotrigine], Celebrex [celecoxib], Ciprofloxacin, Hydrocodone -acetaminophen , and Spironolactone    Review of Systems   Review of Systems  All other systems reviewed and are negative.   Physical Exam Updated Vital Signs BP (!) 125/92 (BP Location: Left Arm)   Pulse 100   Temp 99.3 F (37.4 C) (Oral)   Resp 18   Ht 5\' 2"  (1.575 m)   Wt 97.5 kg   LMP 03/20/2020   SpO2 99%   BMI 39.32 kg/m  Physical Exam Vitals and nursing note reviewed.  Constitutional:      General: She is not in acute distress.    Appearance: Normal appearance. She is well-developed.  HENT:     Head: Normocephalic and atraumatic.  Eyes:     Conjunctiva/sclera: Conjunctivae normal.     Pupils: Pupils are equal, round, and reactive to light.  Cardiovascular:     Rate and Rhythm: Normal rate and regular rhythm.     Heart sounds: Normal heart sounds.  Pulmonary:     Effort: Pulmonary effort is normal. No respiratory distress.     Breath sounds: Normal breath sounds.  Abdominal:     General: There is no distension.     Palpations: Abdomen is soft.     Tenderness: There is abdominal tenderness in the periumbilical area.  Musculoskeletal:        General: No deformity. Normal range of motion.     Cervical back: Normal range of motion and neck supple.  Skin:    General: Skin is warm and dry.  Neurological:      General: No focal deficit present.     Mental Status: She is alert and oriented to person, place, and time.     ED Results / Procedures / Treatments   Labs (all labs ordered are listed, but only abnormal results are displayed) Labs Reviewed  COMPREHENSIVE METABOLIC PANEL WITH GFR - Abnormal; Notable for the following components:      Result Value   CO2 20 (*)    Glucose, Bld 114 (*)    Albumin 3.4 (*)    All other components within normal limits  CBC WITH DIFFERENTIAL/PLATELET - Abnormal; Notable for the following components:   WBC 19.6 (*)    Neutro Abs 16.4 (*)    Monocytes Absolute 1.1 (*)    Abs Immature Granulocytes 0.10 (*)    All other components within normal limits  LIPASE, BLOOD  URINALYSIS, W/ REFLEX TO CULTURE (INFECTION SUSPECTED)    EKG None  Radiology No results found.  Procedures Procedures    Medications Ordered in ED Medications  sodium chloride  0.9 % bolus 1,000 mL (has no administration in time range)  ondansetron  (ZOFRAN ) injection 4 mg (has no administration in time range)    ED Course/ Medical Decision Making/ A&P                                 Medical Decision Making Amount and/or Complexity of Data Reviewed Labs: ordered. Radiology: ordered.  Risk Prescription drug management.    Medical Screen Complete  This patient presented to the ED with complaint of nausea, vomiting, diarrhea.  This complaint involves an extensive number of treatment options. The initial differential diagnosis includes, but is not limited to, bacterial versus viral gastroenteritis, metabolic abnormality, etc.  This presentation is: Acute, Self-Limited, Previously Undiagnosed, Uncertain Prognosis, Complicated, Systemic Symptoms, and Threat to Life/Bodily Function  Patient presents with complaint of nausea, vomiting, and diarrhea.  Patient feels much improved with IV fluids and antiemetics.  Screening labs are remarkable for elevated white count at 19.   CT abdomen pelvis suggest colitis. Patient feels improved after workup and treatment.  She desires discharge home.  Importance of close follow-up was stressed.  Strict return precautions given and understood.  Additional history obtained: External records from outside sources obtained and reviewed including prior ED visits and prior Inpatient records.   Problem List / ED Course:  Nausea, vomiting, diarrhea   Disposition:  After consideration of the diagnostic results and the patients response to treatment, I feel that the patent would benefit from close outpatient follow-up.          Final Clinical Impression(s) / ED Diagnoses Final diagnoses:  Generalized abdominal pain  Nausea vomiting and diarrhea    Rx / DC  Orders ED Discharge Orders          Ordered    ondansetron  (ZOFRAN -ODT) 4 MG disintegrating tablet  Every 8 hours PRN        12/11/23 1257    metroNIDAZOLE (FLAGYL) 500 MG tablet  2 times daily        12/11/23 1257    fluconazole (DIFLUCAN) 150 MG tablet   Once        12/11/23 1257              Dominick Zertuche C, MD 12/11/23 1300

## 2024-01-14 ENCOUNTER — Other Ambulatory Visit: Payer: Self-pay | Admitting: Nurse Practitioner

## 2024-01-14 DIAGNOSIS — J4550 Severe persistent asthma, uncomplicated: Secondary | ICD-10-CM
